# Patient Record
Sex: Female | Born: 1990 | Race: Black or African American | Hispanic: No | Smoking: Never smoker
Health system: Southern US, Community
[De-identification: ages and names within clinical notes are randomized; demographics above are authoritative.]

## PROBLEM LIST (undated history)

## (undated) ENCOUNTER — Inpatient Hospital Stay (HOSPITAL_COMMUNITY): Payer: Self-pay

## (undated) DIAGNOSIS — A749 Chlamydial infection, unspecified: Secondary | ICD-10-CM

## (undated) DIAGNOSIS — D573 Sickle-cell trait: Secondary | ICD-10-CM

## (undated) DIAGNOSIS — D649 Anemia, unspecified: Secondary | ICD-10-CM

## (undated) DIAGNOSIS — I10 Essential (primary) hypertension: Secondary | ICD-10-CM

---
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
## 2010-01-08 ENCOUNTER — Inpatient Hospital Stay (HOSPITAL_COMMUNITY): Admission: AD | Admit: 2010-01-08 | Discharge: 2010-01-08 | Payer: Self-pay | Admitting: Obstetrics & Gynecology

## 2010-01-08 ENCOUNTER — Ambulatory Visit: Payer: Self-pay | Admitting: Nurse Practitioner

## 2010-03-09 ENCOUNTER — Inpatient Hospital Stay (HOSPITAL_COMMUNITY)
Admission: AD | Admit: 2010-03-09 | Discharge: 2010-03-09 | Payer: Self-pay | Source: Home / Self Care | Admitting: Obstetrics & Gynecology

## 2010-04-24 ENCOUNTER — Inpatient Hospital Stay (HOSPITAL_COMMUNITY)
Admission: AD | Admit: 2010-04-24 | Discharge: 2010-04-24 | Payer: Self-pay | Source: Home / Self Care | Attending: Obstetrics and Gynecology | Admitting: Obstetrics and Gynecology

## 2010-05-11 ENCOUNTER — Inpatient Hospital Stay (HOSPITAL_COMMUNITY)
Admission: AD | Admit: 2010-05-11 | Discharge: 2010-05-11 | Payer: Self-pay | Source: Home / Self Care | Attending: Obstetrics & Gynecology | Admitting: Obstetrics & Gynecology

## 2010-05-13 ENCOUNTER — Ambulatory Visit: Admit: 2010-05-13 | Payer: Self-pay | Admitting: Obstetrics & Gynecology

## 2010-05-18 LAB — CBC
HCT: 30.9 % — ABNORMAL LOW (ref 36.0–46.0)
Hemoglobin: 10.4 g/dL — ABNORMAL LOW (ref 12.0–15.0)
MCH: 26.7 pg (ref 26.0–34.0)
MCHC: 33.7 g/dL (ref 30.0–36.0)
MCV: 79.4 fL (ref 78.0–100.0)
Platelets: 205 10*3/uL (ref 150–400)
RBC: 3.89 MIL/uL (ref 3.87–5.11)
RDW: 14.1 % (ref 11.5–15.5)
WBC: 12.2 10*3/uL — ABNORMAL HIGH (ref 4.0–10.5)

## 2010-05-18 LAB — PROTEIN / CREATININE RATIO, URINE
Creatinine, Urine: 129.9 mg/dL
Protein Creatinine Ratio: 0.14 (ref 0.00–0.15)
Total Protein, Urine: 18 mg/dL

## 2010-05-18 LAB — COMPREHENSIVE METABOLIC PANEL
ALT: 17 U/L (ref 0–35)
AST: 22 U/L (ref 0–37)
Albumin: 3.2 g/dL — ABNORMAL LOW (ref 3.5–5.2)
Alkaline Phosphatase: 64 U/L (ref 39–117)
BUN: 2 mg/dL — ABNORMAL LOW (ref 6–23)
CO2: 25 mEq/L (ref 19–32)
Calcium: 8.6 mg/dL (ref 8.4–10.5)
Chloride: 101 mEq/L (ref 96–112)
Creatinine, Ser: 0.58 mg/dL (ref 0.4–1.2)
GFR calc Af Amer: >60 mL/min (ref >60)
GFR calc non Af Amer: >60 mL/min (ref >60)
Glucose, Bld: 89 mg/dL (ref 70–99)
Potassium: 2.8 mEq/L — ABNORMAL LOW (ref 3.5–5.1)
Sodium: 133 mEq/L — ABNORMAL LOW (ref 135–145)
Total Bilirubin: <0.1 mg/dL — ABNORMAL LOW (ref 0.3–1.2)
Total Protein: 7.1 g/dL (ref 6.0–8.3)

## 2010-05-18 LAB — POTASSIUM: Potassium: 2.8 mEq/L — ABNORMAL LOW (ref 3.5–5.1)

## 2010-05-18 LAB — MAGNESIUM: Magnesium: 1.9 mg/dL (ref 1.5–2.5)

## 2010-05-18 LAB — SODIUM, URINE, RANDOM: Sodium, Ur: 85 mEq/L

## 2010-05-27 ENCOUNTER — Ambulatory Visit
Admission: RE | Admit: 2010-05-27 | Discharge: 2010-05-27 | Payer: Self-pay | Source: Home / Self Care | Attending: Obstetrics and Gynecology | Admitting: Obstetrics and Gynecology

## 2010-05-27 ENCOUNTER — Encounter: Payer: Self-pay | Admitting: Obstetrics and Gynecology

## 2010-05-27 LAB — CONVERTED CEMR LAB
Antibody Screen: NEGATIVE
BUN: 3 mg/dL — ABNORMAL LOW (ref 6–23)
Basophils Absolute: 0 10*3/uL (ref 0.0–0.1)
Basophils Relative: 0 % (ref 0–1)
CO2: 24 meq/L (ref 19–32)
Calcium: 8.7 mg/dL (ref 8.4–10.5)
Chlamydia, DNA Probe: NEGATIVE
Chloride: 101 meq/L (ref 96–112)
Creatinine, Ser: 0.58 mg/dL (ref 0.40–1.20)
Eosinophils Absolute: 0.2 10*3/uL (ref 0.0–0.7)
Eosinophils Relative: 2 % (ref 0–5)
GC Probe Amp, Genital: NEGATIVE
Glucose, Bld: 81 mg/dL (ref 70–99)
HCT: 34.3 % — ABNORMAL LOW (ref 36.0–46.0)
HIV: NONREACTIVE
Hemoglobin: 11.2 g/dL — ABNORMAL LOW (ref 12.0–15.0)
Hepatitis B Surface Ag: NEGATIVE
Lymphocytes Relative: 15 % (ref 12–46)
Lymphs Abs: 1.6 10*3/uL (ref 0.7–4.0)
MCHC: 32.7 g/dL (ref 30.0–36.0)
MCV: 80.9 fL (ref 78.0–100.0)
Monocytes Absolute: 0.8 10*3/uL (ref 0.1–1.0)
Monocytes Relative: 8 % (ref 3–12)
Neutro Abs: 7.7 10*3/uL (ref 1.7–7.7)
Neutrophils Relative %: 75 % (ref 43–77)
Platelets: 217 10*3/uL (ref 150–400)
Potassium: 3.7 meq/L (ref 3.5–5.3)
RBC: 4.24 M/uL (ref 3.87–5.11)
RDW: 14.4 % (ref 11.5–15.5)
Rh Type: POSITIVE
Rubella: >500 intl units/mL — ABNORMAL HIGH
Sodium: 137 meq/L (ref 135–145)
Uric Acid, Serum: 3.5 mg/dL (ref 2.4–7.0)
WBC: 10.2 10*3/uL (ref 4.0–10.5)

## 2010-05-27 LAB — POCT URINALYSIS DIPSTICK
Bilirubin Urine: NEGATIVE
Hgb urine dipstick: NEGATIVE
Ketones, ur: NEGATIVE mg/dL
Nitrite: NEGATIVE
Protein, ur: NEGATIVE mg/dL
Specific Gravity, Urine: 1.015 (ref 1.005–1.030)
Urine Glucose, Fasting: NEGATIVE mg/dL
Urobilinogen, UA: 0.2 mg/dL (ref 0.0–1.0)
pH: 6 (ref 5.0–8.0)

## 2010-05-28 ENCOUNTER — Ambulatory Visit (HOSPITAL_COMMUNITY)
Admission: RE | Admit: 2010-05-28 | Discharge: 2010-05-28 | Payer: Self-pay | Source: Home / Self Care | Attending: Family Medicine | Admitting: Family Medicine

## 2010-05-28 ENCOUNTER — Encounter: Payer: Self-pay | Admitting: Obstetrics & Gynecology

## 2010-05-28 LAB — CONVERTED CEMR LAB
Collection Interval-CRCL: 24 h
Creatinine 24 HR UR: 887 mg/(24.h)
Creatinine Clearance: 106 mL/min
Creatinine, Urine: 75.5 mg/dL
Protein, 24H Urine: 47 mg/(24.h) — ABNORMAL LOW

## 2010-06-03 ENCOUNTER — Encounter: Payer: Self-pay | Admitting: Obstetrics and Gynecology

## 2010-06-03 ENCOUNTER — Other Ambulatory Visit: Payer: Self-pay

## 2010-06-03 DIAGNOSIS — O0930 Supervision of pregnancy with insufficient antenatal care, unspecified trimester: Secondary | ICD-10-CM

## 2010-06-03 DIAGNOSIS — O169 Unspecified maternal hypertension, unspecified trimester: Secondary | ICD-10-CM

## 2010-06-03 LAB — POCT URINALYSIS DIPSTICK
Bilirubin Urine: NEGATIVE
Hgb urine dipstick: NEGATIVE
Ketones, ur: NEGATIVE mg/dL
Nitrite: NEGATIVE
Protein, ur: NEGATIVE mg/dL
Specific Gravity, Urine: 1.02 (ref 1.005–1.030)
Urine Glucose, Fasting: NEGATIVE mg/dL
Urobilinogen, UA: 0.2 mg/dL (ref 0.0–1.0)
pH: 6 (ref 5.0–8.0)

## 2010-06-11 ENCOUNTER — Other Ambulatory Visit: Payer: Self-pay

## 2010-06-11 ENCOUNTER — Other Ambulatory Visit: Payer: Self-pay | Admitting: Family Medicine

## 2010-06-11 DIAGNOSIS — O139 Gestational [pregnancy-induced] hypertension without significant proteinuria, unspecified trimester: Secondary | ICD-10-CM

## 2010-06-11 DIAGNOSIS — I10 Essential (primary) hypertension: Secondary | ICD-10-CM

## 2010-06-11 LAB — POCT URINALYSIS DIPSTICK
Bilirubin Urine: NEGATIVE
Hgb urine dipstick: NEGATIVE
Ketones, ur: NEGATIVE mg/dL
Nitrite: NEGATIVE
Protein, ur: NEGATIVE mg/dL
Specific Gravity, Urine: 1.015 (ref 1.005–1.030)
Urine Glucose, Fasting: NEGATIVE mg/dL
Urobilinogen, UA: 0.2 mg/dL (ref 0.0–1.0)
pH: 7 (ref 5.0–8.0)

## 2010-06-15 ENCOUNTER — Other Ambulatory Visit: Payer: Self-pay

## 2010-06-15 DIAGNOSIS — O139 Gestational [pregnancy-induced] hypertension without significant proteinuria, unspecified trimester: Secondary | ICD-10-CM

## 2010-06-18 ENCOUNTER — Encounter (HOSPITAL_COMMUNITY): Payer: Self-pay

## 2010-06-18 ENCOUNTER — Ambulatory Visit (HOSPITAL_COMMUNITY)
Admission: RE | Admit: 2010-06-18 | Discharge: 2010-06-18 | Disposition: A | Discharge status: Home / Self Care | Payer: Medicaid Other | Source: Ambulatory Visit | Attending: Family Medicine | Admitting: Family Medicine

## 2010-06-18 ENCOUNTER — Other Ambulatory Visit: Payer: Self-pay

## 2010-06-18 DIAGNOSIS — O10019 Pre-existing essential hypertension complicating pregnancy, unspecified trimester: Secondary | ICD-10-CM | POA: Insufficient documentation

## 2010-06-18 DIAGNOSIS — Z3689 Encounter for other specified antenatal screening: Secondary | ICD-10-CM | POA: Insufficient documentation

## 2010-06-18 DIAGNOSIS — I10 Essential (primary) hypertension: Secondary | ICD-10-CM

## 2010-06-22 ENCOUNTER — Other Ambulatory Visit: Payer: Self-pay

## 2010-06-25 ENCOUNTER — Other Ambulatory Visit: Payer: Medicaid Other

## 2010-06-25 ENCOUNTER — Other Ambulatory Visit: Payer: Self-pay

## 2010-06-25 DIAGNOSIS — O139 Gestational [pregnancy-induced] hypertension without significant proteinuria, unspecified trimester: Secondary | ICD-10-CM

## 2010-06-25 LAB — POCT URINALYSIS DIPSTICK
Bilirubin Urine: NEGATIVE
Hgb urine dipstick: NEGATIVE
Ketones, ur: NEGATIVE mg/dL
Nitrite: NEGATIVE
Protein, ur: NEGATIVE mg/dL
Specific Gravity, Urine: 1.01 (ref 1.005–1.030)
Urine Glucose, Fasting: NEGATIVE mg/dL
Urobilinogen, UA: 0.2 mg/dL (ref 0.0–1.0)
pH: 6.5 (ref 5.0–8.0)

## 2010-06-29 ENCOUNTER — Other Ambulatory Visit: Payer: Medicaid Other

## 2010-06-29 DIAGNOSIS — O139 Gestational [pregnancy-induced] hypertension without significant proteinuria, unspecified trimester: Secondary | ICD-10-CM

## 2010-07-02 ENCOUNTER — Other Ambulatory Visit: Payer: Self-pay

## 2010-07-02 ENCOUNTER — Encounter: Payer: Self-pay | Admitting: Obstetrics and Gynecology

## 2010-07-02 ENCOUNTER — Other Ambulatory Visit: Payer: Medicaid Other

## 2010-07-02 DIAGNOSIS — O139 Gestational [pregnancy-induced] hypertension without significant proteinuria, unspecified trimester: Secondary | ICD-10-CM

## 2010-07-02 LAB — POCT URINALYSIS DIPSTICK
Bilirubin Urine: NEGATIVE
Hgb urine dipstick: NEGATIVE
Ketones, ur: NEGATIVE mg/dL
Nitrite: NEGATIVE
Protein, ur: NEGATIVE mg/dL
Specific Gravity, Urine: 1.015 (ref 1.005–1.030)
Urine Glucose, Fasting: NEGATIVE mg/dL
Urobilinogen, UA: 0.2 mg/dL (ref 0.0–1.0)
pH: 7 (ref 5.0–8.0)

## 2010-07-02 LAB — CONVERTED CEMR LAB
ALT: <8 units/L (ref 0–35)
AST: 18 units/L (ref 0–37)
Albumin: 3.5 g/dL (ref 3.5–5.2)
Alkaline Phosphatase: 148 units/L — ABNORMAL HIGH (ref 39–117)
BUN: 8 mg/dL (ref 6–23)
CO2: 22 meq/L (ref 19–32)
Calcium: 9 mg/dL (ref 8.4–10.5)
Chloride: 104 meq/L (ref 96–112)
Creatinine, Ser: 0.63 mg/dL (ref 0.40–1.20)
Glucose, Bld: 82 mg/dL (ref 70–99)
HCT: 33.4 % — ABNORMAL LOW (ref 36.0–46.0)
Hemoglobin: 10.8 g/dL — ABNORMAL LOW (ref 12.0–15.0)
MCHC: 32.3 g/dL (ref 30.0–36.0)
MCV: 75.6 fL — ABNORMAL LOW (ref 78.0–100.0)
Platelets: 193 10*3/uL (ref 150–400)
Potassium: 3.7 meq/L (ref 3.5–5.3)
RBC: 4.42 M/uL (ref 3.87–5.11)
RDW: 15.2 % (ref 11.5–15.5)
Sodium: 135 meq/L (ref 135–145)
Total Bilirubin: 0.2 mg/dL — ABNORMAL LOW (ref 0.3–1.2)
Total Protein: 7 g/dL (ref 6.0–8.3)
Uric Acid, Serum: 4.2 mg/dL (ref 2.4–7.0)
WBC: 9.5 10*3/uL (ref 4.0–10.5)

## 2010-07-03 ENCOUNTER — Encounter: Payer: Self-pay | Admitting: Obstetrics & Gynecology

## 2010-07-03 DIAGNOSIS — O139 Gestational [pregnancy-induced] hypertension without significant proteinuria, unspecified trimester: Secondary | ICD-10-CM

## 2010-07-03 LAB — CONVERTED CEMR LAB
Collection Interval-CRCL: 24 hr
Creatinine 24 HR UR: 997 mg/24hr (ref 700–1800)
Creatinine Clearance: 110 mL/min (ref 75–115)
Creatinine, Urine: 70 mg/dL
Protein, 24H Urine: 57 mg/24hr (ref 50–100)

## 2010-07-06 ENCOUNTER — Other Ambulatory Visit: Payer: Medicaid Other

## 2010-07-09 ENCOUNTER — Other Ambulatory Visit: Payer: Self-pay

## 2010-07-09 ENCOUNTER — Encounter: Payer: Self-pay | Admitting: Obstetrics & Gynecology

## 2010-07-09 ENCOUNTER — Other Ambulatory Visit: Payer: Self-pay | Admitting: Family Medicine

## 2010-07-09 DIAGNOSIS — I10 Essential (primary) hypertension: Secondary | ICD-10-CM

## 2010-07-09 DIAGNOSIS — O0930 Supervision of pregnancy with insufficient antenatal care, unspecified trimester: Secondary | ICD-10-CM

## 2010-07-09 DIAGNOSIS — O139 Gestational [pregnancy-induced] hypertension without significant proteinuria, unspecified trimester: Secondary | ICD-10-CM

## 2010-07-09 LAB — POCT URINALYSIS DIPSTICK
Bilirubin Urine: NEGATIVE
Glucose, UA: NEGATIVE mg/dL
Hgb urine dipstick: NEGATIVE
Ketones, ur: NEGATIVE mg/dL
Nitrite: NEGATIVE
Protein, ur: NEGATIVE mg/dL
Specific Gravity, Urine: 1.02 (ref 1.005–1.030)
Urobilinogen, UA: 0.2 mg/dL (ref 0.0–1.0)
pH: 6 (ref 5.0–8.0)

## 2010-07-09 LAB — CONVERTED CEMR LAB
Chlamydia, DNA Probe: NEGATIVE
GC Probe Amp, Genital: NEGATIVE

## 2010-07-10 ENCOUNTER — Encounter: Payer: Self-pay | Admitting: Obstetrics & Gynecology

## 2010-07-13 ENCOUNTER — Other Ambulatory Visit: Payer: Medicaid Other

## 2010-07-13 LAB — CBC
HCT: 33.2 % — ABNORMAL LOW (ref 36.0–46.0)
Hemoglobin: 11.3 g/dL — ABNORMAL LOW (ref 12.0–15.0)
MCH: 27.3 pg (ref 26.0–34.0)
MCHC: 34 g/dL (ref 30.0–36.0)
MCV: 80.2 fL (ref 78.0–100.0)
Platelets: 243 10*3/uL (ref 150–400)
RBC: 4.14 MIL/uL (ref 3.87–5.11)
RDW: 14.8 % (ref 11.5–15.5)
WBC: 11.6 10*3/uL — ABNORMAL HIGH (ref 4.0–10.5)

## 2010-07-13 LAB — URINALYSIS, ROUTINE W REFLEX MICROSCOPIC
Bilirubin Urine: NEGATIVE
Glucose, UA: NEGATIVE mg/dL
Ketones, ur: NEGATIVE mg/dL
Nitrite: NEGATIVE
Protein, ur: NEGATIVE mg/dL
Specific Gravity, Urine: 1.015 (ref 1.005–1.030)
Urobilinogen, UA: 0.2 mg/dL (ref 0.0–1.0)
pH: 6 (ref 5.0–8.0)

## 2010-07-13 LAB — URINE CULTURE
Colony Count: 9000
Culture  Setup Time: 201112232324

## 2010-07-13 LAB — COMPREHENSIVE METABOLIC PANEL
ALT: 24 U/L (ref 0–35)
AST: 44 U/L — ABNORMAL HIGH (ref 0–37)
Albumin: 3.3 g/dL — ABNORMAL LOW (ref 3.5–5.2)
Alkaline Phosphatase: 56 U/L (ref 39–117)
BUN: 4 mg/dL — ABNORMAL LOW (ref 6–23)
CO2: 24 mEq/L (ref 19–32)
Calcium: 8.8 mg/dL (ref 8.4–10.5)
Chloride: 103 mEq/L (ref 96–112)
Creatinine, Ser: 0.52 mg/dL (ref 0.4–1.2)
GFR calc Af Amer: >60 mL/min (ref >60)
GFR calc non Af Amer: >60 mL/min (ref >60)
Glucose, Bld: 90 mg/dL (ref 70–99)
Potassium: 4.4 mEq/L (ref 3.5–5.1)
Sodium: 134 mEq/L — ABNORMAL LOW (ref 135–145)
Total Bilirubin: 0.5 mg/dL (ref 0.3–1.2)
Total Protein: 7.4 g/dL (ref 6.0–8.3)

## 2010-07-13 LAB — URINE MICROSCOPIC-ADD ON

## 2010-07-13 LAB — WET PREP, GENITAL
Trich, Wet Prep: NONE SEEN
Yeast Wet Prep HPF POC: NONE SEEN

## 2010-07-14 LAB — URINALYSIS, ROUTINE W REFLEX MICROSCOPIC
Bilirubin Urine: NEGATIVE
Glucose, UA: NEGATIVE mg/dL
Hgb urine dipstick: NEGATIVE
Ketones, ur: NEGATIVE mg/dL
Nitrite: NEGATIVE
Protein, ur: NEGATIVE mg/dL
Specific Gravity, Urine: 1.02 (ref 1.005–1.030)
Urobilinogen, UA: 0.2 mg/dL (ref 0.0–1.0)
pH: 6.5 (ref 5.0–8.0)

## 2010-07-14 LAB — WET PREP, GENITAL: Yeast Wet Prep HPF POC: NONE SEEN

## 2010-07-14 LAB — URINE CULTURE
Colony Count: >=100000
Culture  Setup Time: 201111080101

## 2010-07-14 LAB — URINE MICROSCOPIC-ADD ON

## 2010-07-14 LAB — GC/CHLAMYDIA PROBE AMP, GENITAL: GC Probe Amp, Genital: POSITIVE — AB

## 2010-07-16 ENCOUNTER — Other Ambulatory Visit: Payer: Self-pay

## 2010-07-16 ENCOUNTER — Inpatient Hospital Stay (HOSPITAL_COMMUNITY)
Admission: AD | Admit: 2010-07-16 | Discharge: 2010-07-16 | Disposition: A | Discharge status: Home / Self Care | Payer: Medicaid Other | Source: Ambulatory Visit | Attending: Family Medicine | Admitting: Family Medicine

## 2010-07-16 ENCOUNTER — Other Ambulatory Visit: Payer: Medicaid Other

## 2010-07-16 DIAGNOSIS — O139 Gestational [pregnancy-induced] hypertension without significant proteinuria, unspecified trimester: Secondary | ICD-10-CM

## 2010-07-16 DIAGNOSIS — O0930 Supervision of pregnancy with insufficient antenatal care, unspecified trimester: Secondary | ICD-10-CM

## 2010-07-16 LAB — COMPREHENSIVE METABOLIC PANEL
AST: 20 U/L (ref 0–37)
BUN: 5 mg/dL — ABNORMAL LOW (ref 6–23)
CO2: 24 mEq/L (ref 19–32)
Chloride: 104 mEq/L (ref 96–112)
Creatinine, Ser: 0.64 mg/dL (ref 0.4–1.2)
GFR calc non Af Amer: >60 mL/min (ref >60)
Glucose, Bld: 86 mg/dL (ref 70–99)
Total Bilirubin: 0.3 mg/dL (ref 0.3–1.2)

## 2010-07-16 LAB — CBC
Hemoglobin: 10.5 g/dL — ABNORMAL LOW (ref 12.0–15.0)
MCH: 23.6 pg — ABNORMAL LOW (ref 26.0–34.0)
MCV: 74.4 fL — ABNORMAL LOW (ref 78.0–100.0)
RBC: 4.45 MIL/uL (ref 3.87–5.11)

## 2010-07-16 LAB — PROTEIN / CREATININE RATIO, URINE
Creatinine, Urine: 150.8 mg/dL
Protein Creatinine Ratio: 0.13 (ref 0.00–0.15)
Total Protein, Urine: 20 mg/dL

## 2010-07-16 LAB — POCT URINALYSIS DIPSTICK
Bilirubin Urine: NEGATIVE
Glucose, UA: NEGATIVE mg/dL
Hgb urine dipstick: NEGATIVE
Specific Gravity, Urine: 1.02 (ref 1.005–1.030)
pH: 6 (ref 5.0–8.0)

## 2010-07-20 ENCOUNTER — Ambulatory Visit (HOSPITAL_COMMUNITY)
Admission: RE | Admit: 2010-07-20 | Discharge: 2010-07-20 | Disposition: A | Discharge status: Home / Self Care | Payer: Medicaid Other | Source: Ambulatory Visit | Attending: Family Medicine | Admitting: Family Medicine

## 2010-07-20 DIAGNOSIS — I10 Essential (primary) hypertension: Secondary | ICD-10-CM

## 2010-07-20 DIAGNOSIS — O10019 Pre-existing essential hypertension complicating pregnancy, unspecified trimester: Secondary | ICD-10-CM | POA: Insufficient documentation

## 2010-07-20 DIAGNOSIS — Z3689 Encounter for other specified antenatal screening: Secondary | ICD-10-CM | POA: Insufficient documentation

## 2010-07-23 ENCOUNTER — Other Ambulatory Visit: Payer: Self-pay | Admitting: Obstetrics & Gynecology

## 2010-07-23 DIAGNOSIS — O139 Gestational [pregnancy-induced] hypertension without significant proteinuria, unspecified trimester: Secondary | ICD-10-CM

## 2010-07-23 DIAGNOSIS — O0930 Supervision of pregnancy with insufficient antenatal care, unspecified trimester: Secondary | ICD-10-CM

## 2010-07-23 LAB — POCT URINALYSIS DIP (DEVICE)
Bilirubin Urine: NEGATIVE
Hgb urine dipstick: NEGATIVE
Nitrite: NEGATIVE
pH: 6.5 (ref 5.0–8.0)

## 2010-07-26 ENCOUNTER — Inpatient Hospital Stay (HOSPITAL_COMMUNITY)
Admission: AD | Admit: 2010-07-26 | Discharge: 2010-07-28 | DRG: 775 | Disposition: A | Discharge status: Home / Self Care | Payer: Medicaid Other | Source: Ambulatory Visit | Attending: Obstetrics & Gynecology | Admitting: Obstetrics & Gynecology

## 2010-07-26 LAB — COMPREHENSIVE METABOLIC PANEL
ALT: 11 U/L (ref 0–35)
ALT: 10 U/L (ref 0–35)
AST: 19 U/L (ref 0–37)
Alkaline Phosphatase: 176 U/L — ABNORMAL HIGH (ref 39–117)
BUN: 9 mg/dL (ref 6–23)
CO2: 25 mEq/L (ref 19–32)
CO2: 25 mEq/L (ref 19–32)
Calcium: 9.3 mg/dL (ref 8.4–10.5)
Chloride: 102 mEq/L (ref 96–112)
Creatinine, Ser: 0.74 mg/dL (ref 0.4–1.2)
GFR calc Af Amer: >60 mL/min (ref >60)
GFR calc non Af Amer: >60 mL/min (ref >60)
GFR calc non Af Amer: >60 mL/min (ref >60)
Glucose, Bld: 92 mg/dL (ref 70–99)
Potassium: 4 mEq/L (ref 3.5–5.1)
Sodium: 132 mEq/L — ABNORMAL LOW (ref 135–145)
Sodium: 137 mEq/L (ref 135–145)
Total Bilirubin: 0.3 mg/dL (ref 0.3–1.2)
Total Protein: 8.5 g/dL — ABNORMAL HIGH (ref 6.0–8.3)

## 2010-07-26 LAB — CBC
HCT: 29.4 % — ABNORMAL LOW (ref 36.0–46.0)
Hemoglobin: 11 g/dL — ABNORMAL LOW (ref 12.0–15.0)
Hemoglobin: 11.5 g/dL — ABNORMAL LOW (ref 12.0–15.0)
Hemoglobin: 9.2 g/dL — ABNORMAL LOW (ref 12.0–15.0)
MCH: 23.4 pg — ABNORMAL LOW (ref 26.0–34.0)
MCHC: 31.3 g/dL (ref 30.0–36.0)
MCHC: 32.1 g/dL (ref 30.0–36.0)
MCHC: 32.2 g/dL (ref 30.0–36.0)
MCV: 74.9 fL — ABNORMAL LOW (ref 78.0–100.0)
RBC: 4.71 MIL/uL (ref 3.87–5.11)
RDW: 16.4 % — ABNORMAL HIGH (ref 11.5–15.5)
RDW: 16.5 % — ABNORMAL HIGH (ref 11.5–15.5)
RDW: 17.4 % — ABNORMAL HIGH (ref 11.5–15.5)
WBC: 15.3 10*3/uL — ABNORMAL HIGH (ref 4.0–10.5)

## 2010-07-26 LAB — URINE MICROSCOPIC-ADD ON

## 2010-07-26 LAB — LIPASE, BLOOD: Lipase: 34 U/L (ref 11–59)

## 2010-07-26 LAB — URINALYSIS, ROUTINE W REFLEX MICROSCOPIC
Bilirubin Urine: NEGATIVE
Bilirubin Urine: NEGATIVE
Hgb urine dipstick: NEGATIVE
Ketones, ur: 15 mg/dL — AB
Nitrite: NEGATIVE
Protein, ur: NEGATIVE mg/dL
Specific Gravity, Urine: 1.015 (ref 1.005–1.030)
Urobilinogen, UA: 0.2 mg/dL (ref 0.0–1.0)

## 2010-07-26 LAB — GC/CHLAMYDIA PROBE AMP, GENITAL
Chlamydia, DNA Probe: NEGATIVE
GC Probe Amp, Genital: NEGATIVE

## 2010-07-26 LAB — DIFFERENTIAL
Basophils Relative: 0 % (ref 0–1)
Eosinophils Relative: 3 % (ref 0–5)
Lymphocytes Relative: 19 % (ref 12–46)
Monocytes Relative: 9 % (ref 3–12)
Neutrophils Relative %: 69 % (ref 43–77)

## 2010-07-26 LAB — URINE CULTURE

## 2010-07-26 LAB — WET PREP, GENITAL: Clue Cells Wet Prep HPF POC: NONE SEEN

## 2010-07-26 LAB — RPR
RPR Ser Ql: NONREACTIVE
RPR Ser Ql: NONREACTIVE

## 2010-07-27 ENCOUNTER — Other Ambulatory Visit: Payer: Medicaid Other

## 2010-08-05 ENCOUNTER — Inpatient Hospital Stay (HOSPITAL_COMMUNITY): Admission: AD | Admit: 2010-08-05 | Payer: Self-pay | Source: Home / Self Care | Admitting: Obstetrics & Gynecology

## 2010-08-06 LAB — URINALYSIS, ROUTINE W REFLEX MICROSCOPIC
Glucose, UA: NEGATIVE mg/dL
Hgb urine dipstick: NEGATIVE
Protein, ur: NEGATIVE mg/dL
Specific Gravity, Urine: 1.024 (ref 1.005–1.030)
Urobilinogen, UA: 0.2 mg/dL (ref 0.0–1.0)

## 2010-08-06 LAB — WET PREP, GENITAL: Trich, Wet Prep: NONE SEEN

## 2010-08-06 LAB — HCG, QUANTITATIVE, PREGNANCY: hCG, Beta Chain, Quant, S: <2 m[IU]/mL (ref <5)

## 2010-08-06 LAB — GC/CHLAMYDIA PROBE AMP, GENITAL: Chlamydia, DNA Probe: POSITIVE — AB

## 2010-08-06 LAB — URINE MICROSCOPIC-ADD ON

## 2010-08-06 LAB — RPR: RPR Ser Ql: NONREACTIVE

## 2010-08-26 ENCOUNTER — Ambulatory Visit: Payer: Medicaid Other | Admitting: Obstetrics and Gynecology

## 2010-09-02 ENCOUNTER — Ambulatory Visit: Payer: Medicaid Other | Admitting: Family Medicine

## 2010-11-23 ENCOUNTER — Encounter: Payer: Self-pay | Admitting: Obstetrics and Gynecology

## 2011-01-21 LAB — POCT I-STAT CREATININE
Creatinine, Ser: 0.9
Operator id: 235561

## 2011-01-21 LAB — I-STAT 8, (EC8 V) (CONVERTED LAB)
Acid-base deficit: 2
BUN: 12
Bicarbonate: 25.3 — ABNORMAL HIGH
Chloride: 105
Glucose, Bld: 98
HCT: 44
Hemoglobin: 15
Operator id: 235561
Potassium: 3.8
Sodium: 138
TCO2: 27
pCO2, Ven: 53 — ABNORMAL HIGH
pH, Ven: 7.287

## 2011-02-03 LAB — POCT URINALYSIS DIP (DEVICE)
Bilirubin Urine: NEGATIVE
Glucose, UA: NEGATIVE
Ketones, ur: NEGATIVE
Operator id: 29721
Protein, ur: NEGATIVE
Specific Gravity, Urine: 1.015

## 2011-02-03 LAB — POCT PREGNANCY, URINE: Preg Test, Ur: NEGATIVE

## 2012-04-03 ENCOUNTER — Inpatient Hospital Stay (HOSPITAL_COMMUNITY)
Admission: AD | Admit: 2012-04-03 | Discharge: 2012-04-04 | Disposition: A | Discharge status: Home / Self Care | Payer: Medicaid Other | Source: Ambulatory Visit | Attending: Obstetrics & Gynecology | Admitting: Obstetrics & Gynecology

## 2012-04-03 ENCOUNTER — Encounter (HOSPITAL_COMMUNITY): Payer: Self-pay | Admitting: *Deleted

## 2012-04-03 ENCOUNTER — Inpatient Hospital Stay (HOSPITAL_COMMUNITY): Payer: Medicaid Other

## 2012-04-03 DIAGNOSIS — O23599 Infection of other part of genital tract in pregnancy, unspecified trimester: Secondary | ICD-10-CM

## 2012-04-03 DIAGNOSIS — A5901 Trichomonal vulvovaginitis: Secondary | ICD-10-CM | POA: Insufficient documentation

## 2012-04-03 DIAGNOSIS — O98819 Other maternal infectious and parasitic diseases complicating pregnancy, unspecified trimester: Secondary | ICD-10-CM | POA: Insufficient documentation

## 2012-04-03 DIAGNOSIS — O209 Hemorrhage in early pregnancy, unspecified: Secondary | ICD-10-CM

## 2012-04-03 HISTORY — DX: Essential (primary) hypertension: I10

## 2012-04-03 LAB — URINALYSIS, ROUTINE W REFLEX MICROSCOPIC
Ketones, ur: NEGATIVE mg/dL
Nitrite: POSITIVE — AB
Specific Gravity, Urine: >1.03 — ABNORMAL HIGH (ref 1.005–1.030)
pH: 6 (ref 5.0–8.0)

## 2012-04-03 LAB — CBC WITH DIFFERENTIAL/PLATELET
Basophils Absolute: 0 10*3/uL (ref 0.0–0.1)
Eosinophils Absolute: 0.2 10*3/uL (ref 0.0–0.7)
Eosinophils Relative: 2 % (ref 0–5)
MCH: 23.9 pg — ABNORMAL LOW (ref 26.0–34.0)
MCV: 72.1 fL — ABNORMAL LOW (ref 78.0–100.0)
Platelets: 241 10*3/uL (ref 150–400)
RDW: 16.7 % — ABNORMAL HIGH (ref 11.5–15.5)
WBC: 9.1 10*3/uL (ref 4.0–10.5)

## 2012-04-03 LAB — POCT PREGNANCY, URINE: Preg Test, Ur: POSITIVE — AB

## 2012-04-03 LAB — WET PREP, GENITAL: Yeast Wet Prep HPF POC: NONE SEEN

## 2012-04-03 LAB — URINE MICROSCOPIC-ADD ON

## 2012-04-03 MED ORDER — METRONIDAZOLE 500 MG PO TABS: 500.0000 mg | ORAL_TABLET | Freq: Two times a day (BID) | ORAL | Status: DC

## 2012-04-03 NOTE — MAU Note (Signed)
Pt staates she had 1 episode of a small amt of bleeding-and no more-no bleeding now-denies cramping

## 2012-04-03 NOTE — MAU Provider Note (Signed)
History     CSN: [id number]  Arrival date and time: 04/03/12 2007   First Provider Initiated Contact with Patient 04/03/12 2138      Chief Complaint  Patient presents with  . Vaginal Bleeding   HPI Wladyslawa Disbro Noh is 21 y.o. G2P1000 [redacted]w[redacted]d weeks presenting with vaginal bleeding.  The bleeding began 1 hours ago--"gush", no clots.  Denies abdominal or pelvic pain.  Is waiting on Medicaid for prenatal care.  [facility].      Past Medical History  Diagnosis Date  . Hypertension     History reviewed. No pertinent past surgical history.  History reviewed. No pertinent family history.  History  Substance Use Topics  . Smoking status: Never Smoker   . Smokeless tobacco: Not on file  . Alcohol Use: No    Allergies: No Known Allergies  Prescriptions prior to admission  Medication Sig Dispense Refill  . acetaminophen (TYLENOL) 325 MG tablet Take 650 mg by mouth every 6 (six) hours as needed. pain        Review of Systems  Constitutional: Negative.   Respiratory: Negative.   Cardiovascular: Negative.   Gastrointestinal: Positive for nausea and constipation. Negative for abdominal pain.  Genitourinary:       + for vaginal bleeding.   Physical Exam   Blood pressure 146/89, pulse 91, temperature 98.1 F (36.7 C), resp. rate 18, height 5\' 2"  (1.575 m), weight 131 lb 6 oz (59.591 kg), last menstrual period 01/07/2012, SpO2 100.00%, unknown if currently breastfeeding.  Physical Exam  Constitutional: She is oriented to person, place, and time. She appears well-developed and well-nourished. No distress.  HENT:  Head: Normocephalic.  Neck: Normal range of motion.  Cardiovascular: Normal rate.   Respiratory: Effort normal.  GI: Soft. She exhibits no distension and no mass. There is no tenderness. There is no rebound and no guarding.  Genitourinary: There is no tenderness or lesion on the right labia. There is no tenderness or lesion on  the left labia. Uterus is enlarged (size >dates). Uterus is not tender. Cervix exhibits no motion tenderness, no discharge and no friability. There is bleeding (small amount of bright red blood with 1 clot.  ) around the vagina.  Neurological: She is alert and oriented to person, place, and time.  Skin: Skin is warm and dry.  Psychiatric: She has a normal mood and affect. Her behavior is normal.   + FHT at 160bbp by doppler    BLOOD TYPE from previous record is B Positive Results for orders placed during the hospital encounter of 04/03/12 (from the past 24 hour(s))  URINALYSIS, ROUTINE W REFLEX MICROSCOPIC     Status: Abnormal   Collection Time   04/03/12  8:45 PM      Component Value Range   Color, Urine YELLOW  YELLOW   APPearance CLOUDY (*) CLEAR   Specific Gravity, Urine >1.030 (*) 1.005 - 1.030   pH 6.0  5.0 - 8.0   Glucose, UA NEGATIVE  NEGATIVE mg/dL   Hgb urine dipstick LARGE (*) NEGATIVE   Bilirubin Urine NEGATIVE  NEGATIVE   Ketones, ur NEGATIVE  NEGATIVE mg/dL   Protein, ur NEGATIVE  NEGATIVE mg/dL   Urobilinogen, UA 0.2  0.0 - 1.0 mg/dL   Nitrite POSITIVE (*) NEGATIVE   Leukocytes, UA SMALL (*) NEGATIVE  URINE MICROSCOPIC-ADD ON     Status: Abnormal   Collection Time   04/03/12  8:45 PM  Component Value Range   Squamous Epithelial / LPF FEW (*) RARE   WBC, UA 11-20  <3 WBC/hpf   RBC / HPF TOO NUMEROUS TO COUNT  <3 RBC/hpf   Bacteria, UA FEW (*) RARE   Urine-Other TRICHOMONAS PRESENT    POCT PREGNANCY, URINE     Status: Abnormal   Collection Time   04/03/12  8:53 PM      Component Value Range   Preg Test, Ur POSITIVE (*) NEGATIVE  CBC WITH DIFFERENTIAL     Status: Abnormal   Collection Time   04/03/12  9:35 PM      Component Value Range   WBC 9.1  4.0 - 10.5 K/uL   RBC 4.44  3.87 - 5.11 MIL/uL   Hemoglobin 10.6 (*) 12.0 - 15.0 g/dL   HCT 16.1 (*) 09.6 - 04.5 %   MCV 72.1 (*) 78.0 - 100.0 fL   MCH 23.9 (*) 26.0 - 34.0 pg   MCHC 33.1  30.0 - 36.0 g/dL   RDW  40.9 (*) 81.1 - 15.5 %   Platelets 241  150 - 400 K/uL   Neutrophils Relative 68  43 - 77 %   Neutro Abs 6.1  1.7 - 7.7 K/uL   Lymphocytes Relative 21  12 - 46 %   Lymphs Abs 1.9  0.7 - 4.0 K/uL   Monocytes Relative 10  3 - 12 %   Monocytes Absolute 0.9  0.1 - 1.0 K/uL   Eosinophils Relative 2  0 - 5 %   Eosinophils Absolute 0.2  0.0 - 0.7 K/uL   Basophils Relative 0  0 - 1 %   Basophils Absolute 0.0  0.0 - 0.1 K/uL  WET PREP, GENITAL     Status: Abnormal   Collection Time   04/03/12  9:45 PM      Component Value Range   Yeast Wet Prep HPF POC NONE SEEN  NONE SEEN   Trich, Wet Prep NONE SEEN  NONE SEEN   Clue Cells Wet Prep HPF POC NONE SEEN  NONE SEEN   WBC, Wet Prep HPF POC FEW (*) NONE SEEN   MAU Course  Procedures  MDM At time of discharge, patient states that after her baby was born over 1 year ago, the doctors told her to stay on her blood pressure medicine.  She was on Labetalol.   Her blood pressure tonight was 146/89 on admission and recheck at time of discharge was 115/69.  Reassure that her blood pressure was normal at this time.  No medication needed.    Assessment and Plan  A:  Vaginal bleeding at [redacted]w[redacted]d gestation    Trichomonal vaginitis  P:  Rx for Flagyl 500mg  po bid X 1 week     Encourage to take prenatal vitamins daily     Begin prenatal care with doctor of your choice  Matt Holmes 04/03/2012, 9:39 PM

## 2012-04-03 NOTE — MAU Note (Signed)
Patient is in with new onset of vaginal bleeding. New pad given. She c/o left mid back pain, no cramping. lmp 01/07/2012

## 2012-04-05 LAB — URINE CULTURE: Colony Count: >=100000

## 2012-04-07 ENCOUNTER — Other Ambulatory Visit: Payer: Self-pay | Admitting: Advanced Practice Midwife

## 2012-04-07 MED ORDER — NITROFURANTOIN MONOHYD MACRO 100 MG PO CAPS: 100.0000 mg | ORAL_CAPSULE | Freq: Two times a day (BID) | ORAL | Status: DC

## 2012-04-07 NOTE — Progress Notes (Signed)
Positive urine culture for E.Coli.  Left message at [phone] for pt to call MAU.  Macrobid 100 mg BID x7 days sent to pt pharmacy.

## 2012-05-03 NOTE — L&D Delivery Note (Signed)
Delivery Note Pt pushed well and at 11:30 PM a viable female was delivered via Vaginal, Spontaneous Delivery (Presentation: OA).  APGAR: 8, 9; weight: pending.  Infant dried and lifted to pt's abd. Cord clamped and cut by FOB. Hospital cord blood sample collected. Placenta status: Intact, Spontaneous.  Cord: 3 vessels  Anesthesia: Epidural  Episiotomy: None Lacerations: None Est. Blood Loss (mL): 250  Mom to postpartum.  Baby to nursery-stable.  Cam Hai 09/20/2012, 12:25 AM

## 2012-05-03 NOTE — L&D Delivery Note (Signed)
Attestation of Attending Supervision of Advanced Practitioner (CNM/NP): Evaluation and management procedures were performed by the Advanced Practitioner under my supervision and collaboration.  I have reviewed the Advanced Practitioner's note and chart, and I agree with the management and plan.  Adryana Mogensen 09/26/2012 10:44 AM   

## 2012-06-29 ENCOUNTER — Inpatient Hospital Stay (HOSPITAL_COMMUNITY)
Admission: AD | Admit: 2012-06-29 | Discharge: 2012-07-01 | DRG: 781 | Disposition: A | Discharge status: Home / Self Care | Payer: Medicaid Other | Source: Ambulatory Visit | Attending: Obstetrics & Gynecology | Admitting: Obstetrics & Gynecology

## 2012-06-29 ENCOUNTER — Inpatient Hospital Stay (HOSPITAL_COMMUNITY): Payer: Medicaid Other

## 2012-06-29 ENCOUNTER — Encounter (HOSPITAL_COMMUNITY): Payer: Self-pay | Admitting: *Deleted

## 2012-06-29 DIAGNOSIS — O0930 Supervision of pregnancy with insufficient antenatal care, unspecified trimester: Secondary | ICD-10-CM

## 2012-06-29 DIAGNOSIS — O26879 Cervical shortening, unspecified trimester: Secondary | ICD-10-CM

## 2012-06-29 DIAGNOSIS — O4700 False labor before 37 completed weeks of gestation, unspecified trimester: Secondary | ICD-10-CM

## 2012-06-29 DIAGNOSIS — N390 Urinary tract infection, site not specified: Secondary | ICD-10-CM

## 2012-06-29 DIAGNOSIS — O10019 Pre-existing essential hypertension complicating pregnancy, unspecified trimester: Secondary | ICD-10-CM

## 2012-06-29 DIAGNOSIS — O26872 Cervical shortening, second trimester: Secondary | ICD-10-CM

## 2012-06-29 DIAGNOSIS — O2390 Unspecified genitourinary tract infection in pregnancy, unspecified trimester: Secondary | ICD-10-CM | POA: Diagnosis present

## 2012-06-29 LAB — URINALYSIS, ROUTINE W REFLEX MICROSCOPIC
Glucose, UA: NEGATIVE mg/dL
Ketones, ur: NEGATIVE mg/dL
Protein, ur: NEGATIVE mg/dL

## 2012-06-29 LAB — OB RESULTS CONSOLE GC/CHLAMYDIA
Chlamydia: POSITIVE
Gonorrhea: NEGATIVE

## 2012-06-29 LAB — COMPREHENSIVE METABOLIC PANEL
ALT: 7 U/L (ref 0–35)
Albumin: 3.1 g/dL — ABNORMAL LOW (ref 3.5–5.2)
Alkaline Phosphatase: 80 U/L (ref 39–117)
Potassium: 3.6 mEq/L (ref 3.5–5.1)
Sodium: 132 mEq/L — ABNORMAL LOW (ref 135–145)
Total Protein: 7.8 g/dL (ref 6.0–8.3)

## 2012-06-29 LAB — URINE MICROSCOPIC-ADD ON

## 2012-06-29 LAB — OB RESULTS CONSOLE HIV ANTIBODY (ROUTINE TESTING): HIV: NONREACTIVE

## 2012-06-29 LAB — CBC
HCT: 30.7 % — ABNORMAL LOW (ref 36.0–46.0)
MCHC: 31.9 g/dL (ref 30.0–36.0)
MCV: 70.1 fL — ABNORMAL LOW (ref 78.0–100.0)
RDW: 16.1 % — ABNORMAL HIGH (ref 11.5–15.5)

## 2012-06-29 LAB — RAPID HIV SCREEN (WH-MAU): Rapid HIV Screen: NONREACTIVE

## 2012-06-29 MED ORDER — ZOLPIDEM TARTRATE 5 MG PO TABS: 5.0000 mg | ORAL_TABLET | Freq: Every evening | ORAL | Status: DC | PRN

## 2012-06-29 MED ORDER — DOCUSATE SODIUM 100 MG PO CAPS
100.0000 mg | ORAL_CAPSULE | Freq: Every day | ORAL | Status: DC
  Administered 2012-06-30: 100 mg via ORAL
  Filled 2012-06-29: qty 1

## 2012-06-29 MED ORDER — PRENATAL MULTIVITAMIN CH
1.0000 | ORAL_TABLET | Freq: Every day | ORAL | Status: DC
  Administered 2012-06-30: 1 via ORAL
  Filled 2012-06-29: qty 1

## 2012-06-29 MED ORDER — SODIUM CHLORIDE 0.9 % IJ SOLN
3.0000 mL | Freq: Two times a day (BID) | INTRAMUSCULAR | Status: DC
  Administered 2012-06-29 – 2012-06-30 (×3): 3 mL via INTRAVENOUS

## 2012-06-29 MED ORDER — BETAMETHASONE SOD PHOS & ACET 6 (3-3) MG/ML IJ SUSP
12.0000 mg | INTRAMUSCULAR | Status: AC
  Administered 2012-06-29 – 2012-06-30 (×2): 12 mg via INTRAMUSCULAR
  Filled 2012-06-29 (×2): qty 2

## 2012-06-29 MED ORDER — CALCIUM CARBONATE ANTACID 500 MG PO CHEW: 2.0000 | CHEWABLE_TABLET | ORAL | Status: DC | PRN

## 2012-06-29 MED ORDER — LABETALOL HCL 100 MG PO TABS
100.0000 mg | ORAL_TABLET | Freq: Two times a day (BID) | ORAL | Status: DC
  Administered 2012-06-29 – 2012-06-30 (×3): 100 mg via ORAL
  Filled 2012-06-29 (×4): qty 1

## 2012-06-29 MED ORDER — ACETAMINOPHEN 325 MG PO TABS: 650.0000 mg | ORAL_TABLET | ORAL | Status: DC | PRN

## 2012-06-29 MED ORDER — SODIUM CHLORIDE 0.9 % IJ SOLN: 3.0000 mL | INTRAMUSCULAR | Status: DC | PRN

## 2012-06-29 MED ORDER — SODIUM CHLORIDE 0.9 % IV SOLN: 250.0000 mL | INTRAVENOUS | Status: DC | PRN

## 2012-06-29 NOTE — MAU Note (Signed)
Patient states she has been having abdominal pain on the left side for several days off and on when walking. Has been worse today and more constant. Patient has had no prenatal care. Denies bleeding or leaking and reports good fetal movement.

## 2012-06-29 NOTE — MAU Provider Note (Signed)
History     CSN: [id number]  Arrival date and time: 06/29/12 1518   None     Chief Complaint  Patient presents with  . Abdominal Pain   HPI  22 year old G2P1001 @ [redacted]w[redacted]d by 14 week ultrasound who has had not prenatal care and presents with left lower abdominal pain. The pain started two days ago and radiates to her back and left inguinal area. It is exacerbated by walking. It is alleviated by nothing. It is not accompanied by nausea, vomiting, diarrhea, vaginal bleeding or liquid discharge, or the sensation of contractions. She denies any recent trama or heavy lifting. She has been compliant with her home labetalol and pre-natal vitamins. She denies any alcohol, drug or tobacco use.    OB History   Grav Para Term Preterm Abortions TAB SAB Ect Mult Living   2 1 1            Previous delivery was NSVD @ term in March 2012   Past Medical History  Diagnosis Date  . Hypertension     No past surgical history on file.  No family history on file.  History  Substance Use Topics  . Smoking status: Never Smoker   . Smokeless tobacco: Not on file  . Alcohol Use: No    Allergies: No Known Allergies  Prescriptions prior to admission  Medication Sig Dispense Refill  . labetalol (NORMODYNE) 200 MG tablet Take 200 mg by mouth daily.      . Prenatal Vit-Fe Fumarate-FA (PRENATAL MULTIVITAMIN) TABS Take 1 tablet by mouth daily at 12 noon.        Review of Systems  Constitutional: Negative for fever, chills and weight loss.  Eyes: Negative for blurred vision.  Respiratory: Negative for shortness of breath.   Gastrointestinal: Negative for abdominal pain.  Genitourinary: Negative for dysuria.  Musculoskeletal: Positive for back pain.  Neurological: Negative for headaches.   Physical Exam   Blood pressure 134/90, pulse 105, temperature 98.2 F (36.8 C), temperature source Oral, resp. rate 16, height 5\' 3"  (1.6 m), weight 67.767 kg (149 lb 6.4 oz), last menstrual period 01/07/2012,  SpO2 100.00%.  Physical Exam  Constitutional: She is oriented to person, place, and time. She appears well-developed and well-nourished. No distress.  HENT:  Head: Normocephalic and atraumatic.  Mouth/Throat: Oropharynx is clear and moist. No oropharyngeal exudate.  Eyes: Pupils are equal, round, and reactive to light.  Neck: Normal range of motion.  Cardiovascular: Normal rate, regular rhythm and normal heart sounds.   Respiratory: Effort normal and breath sounds normal.  GI: There is no tenderness.  Gravid; Uterus measures 24cm  Genitourinary: Vagina normal. There is no lesion on the right labia. There is no lesion on the left labia. Cervix exhibits discharge. No bleeding around the vagina.  Mucousy white discharge  Musculoskeletal: She exhibits no edema.  Neurological: She is alert and oriented to person, place, and time.  Skin: Skin is warm and dry. She is not diaphoretic.  Psychiatric: She has a normal mood and affect. Her behavior is normal. Judgment and thought content normal.   Dilation: 1 Effacement (%): Thick Exam by:: Dr. Penne Lash, Lynnell Chad   MAU Course  Procedures  MDM   Assessment and Plan  22 year old G2P1001 @ [redacted]w[redacted]d with no prenatal care and chronic hypertension who presents with abdominal pain and mild dilation of the cervix - Admit for evaluation of preterm labor: obtain fetal fibronectin, place on monitors, give betamethasone IM x 2  -  Chronic HTN: continue labetalol 200 mg daily, check CBC, CMET, 24 hours urine protein  - Chlamydia Infection: treated in December, obtain test of cure - PPX: SCD's    Mat Carne 06/29/2012, 4:21 PM

## 2012-06-30 LAB — RPR: RPR Ser Ql: NONREACTIVE

## 2012-06-30 LAB — TYPE AND SCREEN

## 2012-06-30 MED ORDER — PROGESTERONE MICRONIZED 200 MG PO CAPS
200.0000 mg | ORAL_CAPSULE | Freq: Every day | ORAL | Status: DC
  Filled 2012-06-30: qty 1

## 2012-06-30 MED ORDER — AZITHROMYCIN 250 MG PO TABS
1000.0000 mg | ORAL_TABLET | Freq: Once | ORAL | Status: AC
  Administered 2012-06-30: 1000 mg via ORAL
  Filled 2012-06-30: qty 4

## 2012-06-30 MED ORDER — NITROFURANTOIN MONOHYD MACRO 100 MG PO CAPS
100.0000 mg | ORAL_CAPSULE | Freq: Two times a day (BID) | ORAL | Status: DC
  Administered 2012-06-30 (×2): 100 mg via ORAL
  Filled 2012-06-30 (×3): qty 1

## 2012-06-30 MED ORDER — CEPHALEXIN 500 MG PO CAPS: 500.0000 mg | ORAL_CAPSULE | Freq: Two times a day (BID) | ORAL | Status: DC

## 2012-06-30 MED ORDER — PROGESTERONE MICRONIZED 200 MG PO CAPS
200.0000 mg | ORAL_CAPSULE | Freq: Every day | ORAL | Status: DC
  Administered 2012-06-30 (×2): 200 mg via VAGINAL
  Filled 2012-06-30 (×2): qty 1

## 2012-06-30 NOTE — Progress Notes (Signed)
Patient ID: Becky Torres, female   DOB: 07/14/1990, 22 y.o.   MRN: [id number] Patient is doing well without any complaints. Patient denies any cramping/pelvic pressure. She reports good fetal movement  Vital signs stable. BP within normal range Toco: no contractions  A/P 22 yo G2P1001 with CHTN admitted with PTL at 27 weeks -Patient hemodynamically stable  - Informed patient that we are awaiting 24 hour urine protein prior to discharge - Also informed patient of positive chlamydia test- will treat with 1 gm azithromycin. Patient advised to inform partner and to abstain from intercourse for 2 weeks after partner gets treated. Patient verbalized understanding - D/C planning in am

## 2012-06-30 NOTE — Progress Notes (Signed)
Patient ID: Becky Torres, female   DOB: 02/13/1991, 23 y.o.   MRN: [id number] FACULTY PRACTICE ANTEPARTUM(COMPREHENSIVE) NOTE  Becky Torres is a 22 y.o. G2P1001 at [redacted]w[redacted]d  who is admitted for Preterm labor.   Length of Stay:  1  Days  Subjective: No complaints Patient reports the fetal movement as active. Patient reports uterine contraction  activity as none. Patient reports  vaginal bleeding as none. Patient describes fluid per vagina as None.  Vitals:  Blood pressure 111/48, pulse 113, temperature 97.9 F (36.6 C), temperature source Oral, resp. rate 18, height 5\' 3"  (1.6 m), weight 149 lb 6.4 oz (67.767 kg), last menstrual period 01/07/2012, SpO2 100.00%. Physical Examination:  General appearance - alert, well appearing, and in no distress Mental status - alert, oriented to person, place, and time Abdomen - Abdomen:  Gravid, soft, non tender, non distended Ext - no edema or leg swelling, homans' negative bilaterally Backl - No CVA tenderness  Fetal Monitoring: irritability on toco.  Labs:  Results for orders placed during the hospital encounter of 06/29/12 (from the past 24 hour(s))  URINALYSIS, ROUTINE W REFLEX MICROSCOPIC   Collection Time    06/29/12  3:43 PM      Result Value Range   Color, Urine YELLOW  YELLOW   APPearance CLEAR  CLEAR   Specific Gravity, Urine 1.015  1.005 - 1.030   pH 6.0  5.0 - 8.0   Glucose, UA NEGATIVE  NEGATIVE mg/dL   Hgb urine dipstick NEGATIVE  NEGATIVE   Bilirubin Urine NEGATIVE  NEGATIVE   Ketones, ur NEGATIVE  NEGATIVE mg/dL   Protein, ur NEGATIVE  NEGATIVE mg/dL   Urobilinogen, UA 0.2  0.0 - 1.0 mg/dL   Nitrite POSITIVE (*) NEGATIVE   Leukocytes, UA SMALL (*) NEGATIVE  URINE MICROSCOPIC-ADD ON   Collection Time    06/29/12  3:43 PM      Result Value Range   Squamous Epithelial / LPF FEW (*) RARE   WBC, UA 3-6  <3 WBC/hpf   RBC / HPF 0-2  <3 RBC/hpf   Bacteria, UA FEW (*) RARE  FETAL FIBRONECTIN   Collection Time    06/29/12   5:00 PM      Result Value Range   Fetal Fibronectin NEGATIVE  NEGATIVE  CBC   Collection Time    06/29/12  5:25 PM      Result Value Range   WBC 11.8 (*) 4.0 - 10.5 K/uL   RBC 4.38  3.87 - 5.11 MIL/uL   Hemoglobin 9.8 (*) 12.0 - 15.0 g/dL   HCT 72.5 (*) 36.6 - 44.0 %   MCV 70.1 (*) 78.0 - 100.0 fL   MCH 22.4 (*) 26.0 - 34.0 pg   MCHC 31.9  30.0 - 36.0 g/dL   RDW 34.7 (*) 42.5 - 95.6 %   Platelets 206  150 - 400 K/uL  COMPREHENSIVE METABOLIC PANEL   Collection Time    06/29/12  5:35 PM      Result Value Range   Sodium 132 (*) 135 - 145 mEq/L   Potassium 3.6  3.5 - 5.1 mEq/L   Chloride 99  96 - 112 mEq/L   CO2 23  19 - 32 mEq/L   Glucose, Bld 91  70 - 99 mg/dL   BUN 7  6 - 23 mg/dL   Creatinine, Ser 3.87  0.50 - 1.10 mg/dL   Calcium 8.9  8.4 - 56.4 mg/dL   Total Protein 7.8  6.0 -  8.3 g/dL   Albumin 3.1 (*) 3.5 - 5.2 g/dL   AST 17  0 - 37 U/L   ALT 7  0 - 35 U/L   Alkaline Phosphatase 80  39 - 117 U/L   Total Bilirubin 0.2 (*) 0.3 - 1.2 mg/dL   GFR calc non Af Amer >90  >90 mL/min   GFR calc Af Amer >90  >90 mL/min  RPR   Collection Time    06/29/12  5:35 PM      Result Value Range   RPR NON REACTIVE  NON REACTIVE  RAPID HIV SCREEN ([facility])   Collection Time    06/29/12  5:35 PM      Result Value Range   SUDS Rapid HIV Screen NON REACTIVE  NON REACTIVE    Imaging Studies:    Anatomy exam to be read by oncoming radiologist.  [place] Tech reported cervix to be 2.4 cm  Medications:  Scheduled . betamethasone acetate-betamethasone sodium phosphate  12 mg Intramuscular Q24 Hr x 2  . [START ON 07/01/2012] cephALEXin  500 mg Oral Q12H  . docusate sodium  100 mg Oral Daily  . labetalol  100 mg Oral BID  . nitrofurantoin (macrocrystal-monohydrate)  100 mg Oral Q12H  . prenatal multivitamin  1 tablet Oral Q1200  . progesterone  200 mg Vaginal Daily  . sodium chloride  3 mL Intravenous Q12H     ASSESSMENT: 22 yo female with threatened preterm labor, short cervix, and  UTI  PLAN: Continue Betamethasone Start MAcrobid (last UTI was ecoli sensitive to macrobid (resistant to bactrim) Vaginal prometrium for short cervix 24 hour urine GCT FFN negative, can consider discharge after second betamethasone and finish 24 hour urine if cervix exam is stable.  Becky Torres H. 06/30/2012,6:48 AM

## 2012-07-01 DIAGNOSIS — O4700 False labor before 37 completed weeks of gestation, unspecified trimester: Secondary | ICD-10-CM | POA: Diagnosis present

## 2012-07-01 DIAGNOSIS — O26879 Cervical shortening, unspecified trimester: Secondary | ICD-10-CM

## 2012-07-01 LAB — CREATININE, URINE, 24 HOUR
Collection Interval-UCRE24: 24 hours
Creatinine, 24H Ur: 1176 mg/d (ref 700–1800)
Creatinine, Urine: 74.7 mg/dL

## 2012-07-01 LAB — URINE CULTURE: Colony Count: >=100000

## 2012-07-01 MED ORDER — DSS 100 MG PO CAPS: 100.0000 mg | ORAL_CAPSULE | Freq: Every day | ORAL | Status: DC

## 2012-07-01 MED ORDER — PROGESTERONE MICRONIZED 200 MG PO CAPS: ORAL_CAPSULE | ORAL | Status: DC

## 2012-07-01 MED ORDER — LABETALOL HCL 100 MG PO TABS: 100.0000 mg | ORAL_TABLET | Freq: Two times a day (BID) | ORAL | Status: DC

## 2012-07-01 MED ORDER — NITROFURANTOIN MONOHYD MACRO 100 MG PO CAPS: 100.0000 mg | ORAL_CAPSULE | Freq: Two times a day (BID) | ORAL | Status: DC

## 2012-07-01 NOTE — Discharge Summary (Addendum)
Physician Discharge Summary  Patient ID: Becky Torres MRN: [id number] DOB/AGE: 25-Mar-1991 21 y.o.  Admit date: 06/29/2012 Discharge date: 07/01/2012  Admission Diagnoses: preterm labor  Discharge Diagnoses: preterm contractions and short cervix Active Problems:   * No active hospital problems. *   Discharged Condition: good  Hospital Course: Patient with chronic hypertension and no prenatal care who was admitted secondary to preterm contractions. During her hospitalization, patient received betamethasone and started on vaginal prometrium. Her contractions improved and her BP were well controlled. On HD#2 patient was felt stable for discharge. Modified bed rest instructions were provided to the patient. Patient will initiate prenatal care at [facility]  Consults: None  Significant Diagnostic Studies: radiology: Ultrasound: cervical length 2.4 with negative FFN  Treatments: IV hydration, steroids: betamethasone and bedrest  Discharge Exam: Blood pressure 105/51, pulse 105, temperature 97.9 F (36.6 C), temperature source Oral, resp. rate 18, height 5\' 3"  (1.6 m), weight 149 lb 6.4 oz (67.767 kg), last menstrual period 01/07/2012, SpO2 100.00%. General appearance: alert, cooperative and no distress GI: soft, gravid, NT Pelvic: cervix: FT/thick/posterior Extremities: Homans sign is negative, no sign of DVT and no edema, redness or tenderness in the calves or thighs Toco: irregular contractions Disposition: 01-Home or Self Care     Medication List    TAKE these medications       DSS 100 MG Caps  Take 100 mg by mouth daily.     labetalol 100 MG tablet  Commonly known as:  NORMODYNE  Take 1 tablet (100 mg total) by mouth 2 (two) times daily.     nitrofurantoin (macrocrystal-monohydrate) 100 MG capsule  Commonly known as:  MACROBID  Take 1 capsule (100 mg total) by mouth every 12 (twelve) hours.     prenatal multivitamin Tabs  Take 1 tablet by mouth daily at  12 noon.     progesterone 200 MG capsule  Commonly known as:  PROMETRIUM  1 capsule per vaginal at bedtime daily           Follow-up Information   Follow up with [facility]. (You will be contacted with an appointment to initiate care in our clinic)    Contact information:   [address] [place] [address] [phone]      Signed: Lagina Reader 07/01/2012, 7:49 AM

## 2012-07-02 ENCOUNTER — Other Ambulatory Visit: Payer: Self-pay | Admitting: Advanced Practice Midwife

## 2012-07-02 LAB — CULTURE, BETA STREP (GROUP B ONLY)

## 2012-07-02 MED ORDER — CEPHALEXIN 500 MG PO CAPS: 500.0000 mg | ORAL_CAPSULE | Freq: Two times a day (BID) | ORAL | Status: DC

## 2012-07-02 NOTE — H&P (Signed)
Becky Torres is a 22 y.o. G2P1001 at presenting at 26.6 for abdominal pain   Fetal presentation is unsure.  History of Present Illness: 22 year old G2P1001 @ [redacted]w[redacted]d by 14 week ultrasound who has had not prenatal care and presents with left lower abdominal pain. The pain started two days ago and radiates to her back and left inguinal area. It is exacerbated by walking. It is alleviated by nothing. It is not accompanied by nausea, vomiting, diarrhea, vaginal bleeding or liquid discharge, or the sensation of contractions. She denies any recent trama or heavy lifting. She has been compliant with her home labetalol and pre-natal vitamins. She denies any alcohol, drug or tobacco use.   Patient reports the fetal movement as active. Patient reports uterine contraction  activity as none. Patient reports  vaginal bleeding as none. Patient describes fluid per vagina as None.  Patient Active Problem List  Diagnosis  . Short cervix affecting pregnancy  . Preterm uterine contractions, antepartum    Past Medical History  Diagnosis Date  . Hypertension      Past Surgical History: History reviewed. No pertinent past surgical history.  Obstetrical History: OB History   Grav Para Term Preterm Abortions TAB SAB Ect Mult Living   2 1 1       1       Social History: History   Social History  . Marital Status: Single    Spouse Name: N/A    Number of Children: N/A  . Years of Education: N/A   Occupational History  . Not on file.   Social History Main Topics  . Smoking status: Never Smoker   . Smokeless tobacco: Not on file  . Alcohol Use: No  . Drug Use: No  . Sexually Active: Yes    Birth Control/ Protection: None   Other Topics Concern  . Not on file   Social History Narrative  . No narrative on file    Family History: No family history on file.   Allergies: No Known Allergies 0  No prescriptions prior to admission   Review of Systems  Constitutional: Negative for  fever, chills and weight loss.  Eyes: Negative for blurred vision.  Respiratory: Negative for shortness of breath.  Gastrointestinal: Negative for abdominal pain.  Genitourinary: Negative for dysuria.  Musculoskeletal: Positive for back pain.  Neurological: Negative for headaches.    VITALS:  Blood pressure 134/90, pulse 105, temperature 98.2 F (36.8 C), temperature source Oral, resp. rate 16, height 5\' 3"  (1.6 m), weight 67.767 kg (149 lb 6.4 oz), last menstrual period 01/07/2012, SpO2 100.00%.    Physical Exam  Constitutional: She is oriented to person, place, and time. She appears well-developed and well-nourished. No distress.  HENT:  Head: Normocephalic and atraumatic.  Mouth/Throat: Oropharynx is clear and moist. No oropharyngeal exudate.  Eyes: Pupils are equal, round, and reactive to light.  Neck: Normal range of motion.  Cardiovascular: Normal rate, regular rhythm and normal heart sounds.  Respiratory: Effort normal and breath sounds normal.  GI: There is no tenderness.  Gravid; Uterus measures 24cm  Genitourinary: Vagina normal. There is no lesion on the right labia. There is no lesion on the left labia. Cervix exhibits discharge. No bleeding around the vagina.  Mucousy white discharge  Musculoskeletal: She exhibits no edema.  Neurological: She is alert and oriented to person, place, and time.  Skin: Skin is warm and dry. She is not diaphoretic.  Psychiatric: She has a normal mood and affect. Her  behavior is normal. Judgment and thought content normal.   CERVICAL EXAM Dilation: 1  Effacement (%): Thick  Exam by:: Dr. Penne Lash, Lynnell Chad  Membranes: intact  Fetal Monitoring: 135, mod var, accels present, occ variable  Labs: FFN negative Urine dipstick shows positive for nitrates, leukocytes.  Micro exam: 3-6 WBC's per HPF.  Imaging Studies: none  ASSESSMENT: 22 year old G2P1001 @ [redacted]w[redacted]d with no prenatal care and chronic hypertension who presents with  abdominal pain and mild dilation of the cervix  - Admit for evaluation of preterm labor: obtain fetal fibronectin, place on monitors, give betamethasone IM x 2  - Chronic HTN: continue labetalol 200 mg daily, check CBC, CMET, 24 hours urine protein  - Chlamydia Infection: treated in December, obtain test of cure  - PPX: SCD's  Napoleon Form, MD

## 2012-07-02 NOTE — MAU Provider Note (Signed)
I reviewed resident note, labs, imaging and FHTs and agree with above. Patient admitted to Antenatal. See H&P same date. Napoleon Form, MD

## 2012-07-02 NOTE — Progress Notes (Signed)
Urine culture on 2/27 + e coli, on macrobid, intermediate sensitivity, will call in Keflex 500 mg 1 po bid x 7 days.

## 2012-07-04 LAB — HEPATITIS B E ANTIBODY: Hep B E Ab: NEGATIVE

## 2012-07-05 ENCOUNTER — Other Ambulatory Visit: Payer: Self-pay | Admitting: Obstetrics & Gynecology

## 2012-07-05 MED ORDER — CEPHALEXIN 500 MG PO CAPS: 500.0000 mg | ORAL_CAPSULE | Freq: Four times a day (QID) | ORAL | Status: DC

## 2012-07-05 NOTE — Progress Notes (Signed)
Called patient, no answer- left message stating that based off her most recent urine culture she has a urinary tract infection and an antibiotic has been called in to her [facility] pharmacy on [address] and if she has any problems with her medication or any questions to please give [place] a call back at the clinics

## 2012-07-05 NOTE — Progress Notes (Signed)
Pt should be on qid keflex for uti in pregnancy.  New Rx at pharmacy

## 2012-07-05 NOTE — H&P (Signed)
Pt seen and examined Agree with above note.  LEGGETT,KELLY H. 07/05/2012 12:27 PM  

## 2012-07-24 ENCOUNTER — Other Ambulatory Visit: Payer: Self-pay | Admitting: Obstetrics and Gynecology

## 2012-07-24 ENCOUNTER — Ambulatory Visit (INDEPENDENT_AMBULATORY_CARE_PROVIDER_SITE_OTHER): Payer: Self-pay | Admitting: Obstetrics and Gynecology

## 2012-07-24 ENCOUNTER — Encounter: Payer: Self-pay | Admitting: Obstetrics and Gynecology

## 2012-07-24 VITALS — BP 113/76 | Temp 97.3°F | Wt 150.4 lb

## 2012-07-24 DIAGNOSIS — O139 Gestational [pregnancy-induced] hypertension without significant proteinuria, unspecified trimester: Secondary | ICD-10-CM

## 2012-07-24 DIAGNOSIS — O169 Unspecified maternal hypertension, unspecified trimester: Secondary | ICD-10-CM | POA: Insufficient documentation

## 2012-07-24 DIAGNOSIS — O98319 Other infections with a predominantly sexual mode of transmission complicating pregnancy, unspecified trimester: Secondary | ICD-10-CM

## 2012-07-24 DIAGNOSIS — O10913 Unspecified pre-existing hypertension complicating pregnancy, third trimester: Secondary | ICD-10-CM | POA: Insufficient documentation

## 2012-07-24 DIAGNOSIS — O163 Unspecified maternal hypertension, third trimester: Secondary | ICD-10-CM

## 2012-07-24 DIAGNOSIS — Z3480 Encounter for supervision of other normal pregnancy, unspecified trimester: Secondary | ICD-10-CM | POA: Insufficient documentation

## 2012-07-24 DIAGNOSIS — A568 Sexually transmitted chlamydial infection of other sites: Secondary | ICD-10-CM | POA: Insufficient documentation

## 2012-07-24 DIAGNOSIS — Z3483 Encounter for supervision of other normal pregnancy, third trimester: Secondary | ICD-10-CM

## 2012-07-24 DIAGNOSIS — O10911 Unspecified pre-existing hypertension complicating pregnancy, first trimester: Secondary | ICD-10-CM | POA: Insufficient documentation

## 2012-07-24 DIAGNOSIS — I10 Essential (primary) hypertension: Secondary | ICD-10-CM

## 2012-07-24 LAB — POCT URINALYSIS DIP (DEVICE)
Bilirubin Urine: NEGATIVE
Glucose, UA: NEGATIVE mg/dL
Hgb urine dipstick: NEGATIVE
Ketones, ur: NEGATIVE mg/dL
Nitrite: NEGATIVE
pH: 6 (ref 5.0–8.0)

## 2012-07-24 NOTE — Progress Notes (Signed)
Nutrition note: 1st visit consult Pt has gained 25.4# @ [redacted]w[redacted]d, which is slightly > expected. Pt reports eating 3 meals & 4-5 snacks/d. Pt reports no N&V or heartburn. Pt reports NKFA. Pt is taking PNV. Pt received verbal & written education on general nutrition during pregnancy. Encouraged protein with all meals & snacks. Disc wt gain goals of 25-35# or 1#/wk. Pt agrees to continue taking PNV. Pt does not have WIC but plans to apply. Pt plans to BF. F/u if referred Blondell Reveal, MS, RD, LDN

## 2012-07-24 NOTE — Progress Notes (Signed)
Patient doing well without complaints. Continue daily prometrium. BP well controlled on labetalol. Unable to stay for 1 hr GCT today but will return this week. Will start twice weekly NST at next visit. FM/PTL precautions reviewed. Cervical exam unchanged from February admission. 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 Patient is sitting most of the time.

## 2012-07-25 ENCOUNTER — Other Ambulatory Visit: Payer: Self-pay

## 2012-08-07 ENCOUNTER — Other Ambulatory Visit: Payer: Self-pay

## 2012-08-10 ENCOUNTER — Other Ambulatory Visit: Payer: Self-pay

## 2012-08-11 ENCOUNTER — Ambulatory Visit (INDEPENDENT_AMBULATORY_CARE_PROVIDER_SITE_OTHER): Payer: Self-pay | Admitting: *Deleted

## 2012-08-11 VITALS — BP 119/73 | Wt 154.4 lb

## 2012-08-11 DIAGNOSIS — O169 Unspecified maternal hypertension, unspecified trimester: Secondary | ICD-10-CM

## 2012-08-11 NOTE — Progress Notes (Signed)
NST reviewed and reactive.  Chea Malan L. Harraway-Smith, M.D., FACOG    

## 2012-08-11 NOTE — Progress Notes (Signed)
P = 89  Attempted to have 1hr GTT today- pt vomited glucola- will try again on 4/14.

## 2012-08-14 ENCOUNTER — Other Ambulatory Visit: Payer: Self-pay

## 2012-08-14 ENCOUNTER — Other Ambulatory Visit: Payer: Self-pay | Admitting: Family Medicine

## 2012-08-14 ENCOUNTER — Encounter: Payer: Self-pay | Admitting: Obstetrics and Gynecology

## 2012-08-14 LAB — POCT URINALYSIS DIP (DEVICE)
Bilirubin Urine: NEGATIVE
Ketones, ur: NEGATIVE mg/dL
Protein, ur: 30 mg/dL — AB
Specific Gravity, Urine: 1.025 (ref 1.005–1.030)
pH: 6 (ref 5.0–8.0)

## 2012-08-17 ENCOUNTER — Other Ambulatory Visit: Payer: Self-pay

## 2012-08-24 ENCOUNTER — Ambulatory Visit (INDEPENDENT_AMBULATORY_CARE_PROVIDER_SITE_OTHER): Payer: Self-pay | Admitting: Obstetrics & Gynecology

## 2012-08-24 ENCOUNTER — Other Ambulatory Visit: Payer: Self-pay | Admitting: Family Medicine

## 2012-08-24 VITALS — BP 121/81 | Wt 157.8 lb

## 2012-08-24 DIAGNOSIS — O163 Unspecified maternal hypertension, third trimester: Secondary | ICD-10-CM

## 2012-08-24 DIAGNOSIS — I10 Essential (primary) hypertension: Secondary | ICD-10-CM

## 2012-08-24 DIAGNOSIS — O139 Gestational [pregnancy-induced] hypertension without significant proteinuria, unspecified trimester: Secondary | ICD-10-CM

## 2012-08-24 DIAGNOSIS — O169 Unspecified maternal hypertension, unspecified trimester: Secondary | ICD-10-CM

## 2012-08-24 LAB — POCT URINALYSIS DIP (DEVICE)
Glucose, UA: NEGATIVE mg/dL
Hgb urine dipstick: NEGATIVE
Ketones, ur: NEGATIVE mg/dL
Specific Gravity, Urine: 1.02 (ref 1.005–1.030)
Urobilinogen, UA: 1 mg/dL (ref 0.0–1.0)

## 2012-08-24 NOTE — Progress Notes (Signed)
Pt encouraged to increase po water intake due to low nml AFV.  Also advised of importance of twice weekly fetal testing. Pt states she has had poor transportation and now feels she has it worked out.

## 2012-08-24 NOTE — Progress Notes (Signed)
NST performed today was reviewed and was found to be reactive.  AFI 8.9 cm.  Followup growth scan scheduled.  Continue recommended antenatal testing and prenatal care. No other complaints or concerns.  Fetal movement and labor precautions reviewed.

## 2012-08-24 NOTE — Patient Instructions (Signed)
Return to clinic for any obstetric concerns or go to MAU for evaluation  

## 2012-08-24 NOTE — Progress Notes (Signed)
P=90, c/o pain in legs sometimes"like I been doing squats all day" states may be due to chasing toddler,  Forgot to bring jelly beans to do 1 hr.gtt . Will try to bring Monday with nst.

## 2012-08-28 ENCOUNTER — Ambulatory Visit (INDEPENDENT_AMBULATORY_CARE_PROVIDER_SITE_OTHER): Payer: Self-pay | Admitting: *Deleted

## 2012-08-28 VITALS — BP 123/83

## 2012-08-28 DIAGNOSIS — O139 Gestational [pregnancy-induced] hypertension without significant proteinuria, unspecified trimester: Secondary | ICD-10-CM

## 2012-08-28 DIAGNOSIS — O163 Unspecified maternal hypertension, third trimester: Secondary | ICD-10-CM

## 2012-08-28 NOTE — Progress Notes (Signed)
P = 95   1hr GTT done today

## 2012-08-28 NOTE — Addendum Note (Signed)
Addended by: Franchot Mimes on: 08/28/2012 03:28 PM   Modules accepted: Orders

## 2012-08-29 LAB — GLUCOSE TOLERANCE, 1 HOUR (50G) W/O FASTING: Glucose, 1 Hour GTT: 104 mg/dL (ref 70–140)

## 2012-08-30 ENCOUNTER — Encounter: Payer: Self-pay | Admitting: Obstetrics & Gynecology

## 2012-08-30 NOTE — Progress Notes (Signed)
4/28 NST reviewed and reactive 

## 2012-08-31 ENCOUNTER — Encounter: Payer: Self-pay | Admitting: Obstetrics & Gynecology

## 2012-08-31 ENCOUNTER — Ambulatory Visit (HOSPITAL_COMMUNITY): Payer: MEDICAID

## 2012-08-31 ENCOUNTER — Other Ambulatory Visit: Payer: Self-pay

## 2012-08-31 ENCOUNTER — Ambulatory Visit (HOSPITAL_COMMUNITY): Admission: RE | Admit: 2012-08-31 | Payer: MEDICAID | Source: Ambulatory Visit

## 2012-09-04 ENCOUNTER — Other Ambulatory Visit: Payer: Self-pay

## 2012-09-19 ENCOUNTER — Encounter (HOSPITAL_COMMUNITY): Payer: Self-pay | Admitting: *Deleted

## 2012-09-19 ENCOUNTER — Inpatient Hospital Stay (HOSPITAL_COMMUNITY): Payer: Medicaid Other | Admitting: Anesthesiology

## 2012-09-19 ENCOUNTER — Encounter (HOSPITAL_COMMUNITY): Payer: Self-pay | Admitting: Anesthesiology

## 2012-09-19 ENCOUNTER — Inpatient Hospital Stay (HOSPITAL_COMMUNITY)
Admission: AD | Admit: 2012-09-19 | Discharge: 2012-09-21 | DRG: 774 | Disposition: A | Discharge status: Home / Self Care | Payer: Medicaid Other | Source: Ambulatory Visit | Attending: Obstetrics and Gynecology | Admitting: Obstetrics and Gynecology

## 2012-09-19 DIAGNOSIS — O4700 False labor before 37 completed weeks of gestation, unspecified trimester: Secondary | ICD-10-CM

## 2012-09-19 DIAGNOSIS — O1002 Pre-existing essential hypertension complicating childbirth: Secondary | ICD-10-CM

## 2012-09-19 DIAGNOSIS — Z3483 Encounter for supervision of other normal pregnancy, third trimester: Secondary | ICD-10-CM

## 2012-09-19 DIAGNOSIS — O26879 Cervical shortening, unspecified trimester: Secondary | ICD-10-CM

## 2012-09-19 DIAGNOSIS — IMO0001 Reserved for inherently not codable concepts without codable children: Secondary | ICD-10-CM

## 2012-09-19 DIAGNOSIS — O163 Unspecified maternal hypertension, third trimester: Secondary | ICD-10-CM

## 2012-09-19 DIAGNOSIS — O0930 Supervision of pregnancy with insufficient antenatal care, unspecified trimester: Secondary | ICD-10-CM

## 2012-09-19 DIAGNOSIS — O98313 Other infections with a predominantly sexual mode of transmission complicating pregnancy, third trimester: Secondary | ICD-10-CM

## 2012-09-19 LAB — RPR: RPR Ser Ql: NONREACTIVE

## 2012-09-19 LAB — CBC
MCH: 19.4 pg — ABNORMAL LOW (ref 26.0–34.0)
Platelets: 138 10*3/uL — ABNORMAL LOW (ref 150–400)
RBC: 4.85 MIL/uL (ref 3.87–5.11)
RDW: 19.1 % — ABNORMAL HIGH (ref 11.5–15.5)
WBC: 10.9 10*3/uL — ABNORMAL HIGH (ref 4.0–10.5)

## 2012-09-19 LAB — OB RESULTS CONSOLE GBS: GBS: POSITIVE

## 2012-09-19 MED ORDER — OXYCODONE-ACETAMINOPHEN 5-325 MG PO TABS: 1.0000 | ORAL_TABLET | ORAL | Status: DC | PRN

## 2012-09-19 MED ORDER — ONDANSETRON HCL 4 MG/2ML IJ SOLN
4.0000 mg | Freq: Four times a day (QID) | INTRAMUSCULAR | Status: DC | PRN
  Administered 2012-09-19: 4 mg via INTRAVENOUS
  Filled 2012-09-19: qty 2

## 2012-09-19 MED ORDER — EPHEDRINE 5 MG/ML INJ
10.0000 mg | INTRAVENOUS | Status: DC | PRN
  Filled 2012-09-19: qty 4
  Filled 2012-09-19: qty 2

## 2012-09-19 MED ORDER — PHENYLEPHRINE 40 MCG/ML (10ML) SYRINGE FOR IV PUSH (FOR BLOOD PRESSURE SUPPORT)
80.0000 ug | PREFILLED_SYRINGE | INTRAVENOUS | Status: DC | PRN
  Filled 2012-09-19: qty 2

## 2012-09-19 MED ORDER — DIPHENHYDRAMINE HCL 50 MG/ML IJ SOLN: 12.5000 mg | INTRAMUSCULAR | Status: DC | PRN

## 2012-09-19 MED ORDER — IBUPROFEN 600 MG PO TABS
600.0000 mg | ORAL_TABLET | Freq: Four times a day (QID) | ORAL | Status: DC | PRN
  Administered 2012-09-20: 600 mg via ORAL
  Filled 2012-09-19: qty 1

## 2012-09-19 MED ORDER — LABETALOL HCL 100 MG PO TABS
100.0000 mg | ORAL_TABLET | Freq: Two times a day (BID) | ORAL | Status: DC
  Administered 2012-09-19 – 2012-09-21 (×5): 100 mg via ORAL
  Filled 2012-09-19 (×5): qty 1

## 2012-09-19 MED ORDER — SODIUM BICARBONATE 8.4 % IV SOLN
INTRAVENOUS | Status: DC | PRN
  Administered 2012-09-19: 5 mL via EPIDURAL

## 2012-09-19 MED ORDER — OXYTOCIN 40 UNITS IN LACTATED RINGERS INFUSION - SIMPLE MED
62.5000 mL/h | INTRAVENOUS | Status: DC
  Filled 2012-09-19: qty 1000

## 2012-09-19 MED ORDER — LACTATED RINGERS IV SOLN: 500.0000 mL | INTRAVENOUS | Status: DC | PRN

## 2012-09-19 MED ORDER — FENTANYL 2.5 MCG/ML BUPIVACAINE 1/10 % EPIDURAL INFUSION (WH - ANES)
14.0000 mL/h | INTRAMUSCULAR | Status: DC | PRN
  Administered 2012-09-19 (×2): 14 mL/h via EPIDURAL
  Filled 2012-09-19 (×2): qty 125

## 2012-09-19 MED ORDER — OXYTOCIN BOLUS FROM INFUSION
500.0000 mL | INTRAVENOUS | Status: DC
  Administered 2012-09-19: 500 mL via INTRAVENOUS

## 2012-09-19 MED ORDER — NALBUPHINE SYRINGE 5 MG/0.5 ML
5.0000 mg | INJECTION | INTRAMUSCULAR | Status: DC | PRN
  Filled 2012-09-19: qty 0.5

## 2012-09-19 MED ORDER — ACETAMINOPHEN 325 MG PO TABS: 650.0000 mg | ORAL_TABLET | ORAL | Status: DC | PRN

## 2012-09-19 MED ORDER — PHENYLEPHRINE 40 MCG/ML (10ML) SYRINGE FOR IV PUSH (FOR BLOOD PRESSURE SUPPORT)
80.0000 ug | PREFILLED_SYRINGE | INTRAVENOUS | Status: DC | PRN
  Filled 2012-09-19: qty 5
  Filled 2012-09-19: qty 2

## 2012-09-19 MED ORDER — LACTATED RINGERS IV SOLN
INTRAVENOUS | Status: DC
  Administered 2012-09-19: 12:00:00 via INTRAVENOUS

## 2012-09-19 MED ORDER — LIDOCAINE HCL (PF) 1 % IJ SOLN
30.0000 mL | INTRAMUSCULAR | Status: DC | PRN
  Filled 2012-09-19 (×2): qty 30

## 2012-09-19 MED ORDER — CITRIC ACID-SODIUM CITRATE 334-500 MG/5ML PO SOLN: 30.0000 mL | ORAL | Status: DC | PRN

## 2012-09-19 MED ORDER — PENICILLIN G POTASSIUM 5000000 UNITS IJ SOLR
5.0000 10*6.[IU] | Freq: Once | INTRAVENOUS | Status: AC
  Administered 2012-09-19: 5 10*6.[IU] via INTRAVENOUS
  Filled 2012-09-19: qty 5

## 2012-09-19 MED ORDER — LACTATED RINGERS IV SOLN
500.0000 mL | Freq: Once | INTRAVENOUS | Status: AC
  Administered 2012-09-19: 1000 mL via INTRAVENOUS

## 2012-09-19 MED ORDER — EPHEDRINE 5 MG/ML INJ
10.0000 mg | INTRAVENOUS | Status: DC | PRN
  Administered 2012-09-19: 5 mg via INTRAVENOUS
  Filled 2012-09-19: qty 2

## 2012-09-19 MED ORDER — PENICILLIN G POTASSIUM 5000000 UNITS IJ SOLR
2.5000 10*6.[IU] | INTRAVENOUS | Status: DC
  Administered 2012-09-19: 2.5 10*6.[IU] via INTRAVENOUS
  Filled 2012-09-19 (×6): qty 2.5

## 2012-09-19 NOTE — Progress Notes (Signed)
Becky Torres is a 22 y.o. G2P1001 at [redacted]w[redacted]d.  Subjective: Comfortable w/ epidural  Objective: BP 109/80  Pulse 83  Temp(Src) 98.1 F (36.7 C) (Oral)  Resp 18  Ht 5\' 3"  (1.6 m)  Wt 73.029 kg (161 lb)  BMI 28.53 kg/m2  SpO2 100%  LMP 01/07/2012 I/O last 3 completed shifts: In: -  Out: 75 [Urine:75]    FHT:  FHR: 125 bpm, variability: moderate,  accelerations:  Present,  decelerations:  Absent UC:   regular, every 2-4 minutes, moderate SVE:   Dilation: 6 Effacement (%): 80 Station: -1 Exam by:: V Sita Mangen CNM Anterior AROM moderate amount of clear fluid.  Labs: Lab Results  Component Value Date   WBC 10.9* 09/19/2012   HGB 9.4* 09/19/2012   HCT 30.7* 09/19/2012   MCV 63.3* 09/19/2012   PLT 138* 09/19/2012    Assessment / Plan: Protracted active phase  Labor: progressing slowly. Assess Progress after AROM.  Preeclampsia:  NA Fetal Wellbeing:  Category I Pain Control:  Epidural I/D:  n/a Anticipated MOD:  NSVD  Tirso Laws 09/19/2012, 8:58 PM

## 2012-09-19 NOTE — Anesthesia Preprocedure Evaluation (Addendum)
Anesthesia Evaluation  Patient identified by MRN, date of birth, ID band Patient awake    Reviewed: Allergy & Precautions, H&P , Patient's Chart, lab work & pertinent test results  Airway Mallampati: II  TM Distance: >3 FB Neck ROM: full    Dental  (+) Teeth Intact   Pulmonary  breath sounds clear to auscultation        Cardiovascular hypertension, Rhythm:regular Rate:Normal     Neuro/Psych    GI/Hepatic   Endo/Other    Renal/GU      Musculoskeletal   Abdominal   Peds  Hematology  (+) anemia ,   Anesthesia Other Findings       Reproductive/Obstetrics (+) Pregnancy                             Anesthesia Physical Anesthesia Plan  ASA: II  Anesthesia Plan: Epidural   Post-op Pain Management:    Induction:   Airway Management Planned:   Additional Equipment:   Intra-op Plan:   Post-operative Plan:   Informed Consent: I have reviewed the patients History and Physical, chart, labs and discussed the procedure including the risks, benefits and alternatives for the proposed anesthesia with the patient or authorized representative who has indicated his/her understanding and acceptance.   Dental Advisory Given  Plan Discussed with:   Anesthesia Plan Comments: (Labs checked- platelets confirmed with RN in room. Fetal heart tracing, per RN, reported to be stable enough for sitting procedure. Discussed epidural, and patient consents to the procedure:  included risk of possible headache,backache, failed block, allergic reaction, and nerve injury. This patient was asked if she had any questions or concerns before the procedure started.)        Anesthesia Quick Evaluation  

## 2012-09-19 NOTE — MAU Provider Note (Signed)
  History     CSN: [id number]  Arrival date and time: 09/19/12 4098   First Provider Initiated Contact with Patient 09/19/12 1102      Chief Complaint  Patient presents with  . Labor Eval   HPI Becky Torres is a 22 y.o. G2P1001 at [redacted]w[redacted]d with history of preterm contractions, chronic HTN, and short cervix who presents with complaints of contractions.  She reports that yesterday evening she began to have back pain after a long day of activity.  This am the pain persisted and she felt that she was having contractions.  She timed her contractions and they were occurring every 10 mins.  Given regularity and frequency of contractions she came to the MAU for evaluation.    ROS: See section below  Past Medical History  Diagnosis Date  . Hypertension     History reviewed. No pertinent past surgical history.  Family History  Problem Relation Age of Onset  . Hypertension Mother   . Hypertension Brother   . Hypertension Maternal Aunt   . Hypertension Maternal Grandmother   . Diabetes Paternal Grandmother     History  Substance Use Topics  . Smoking status: Never Smoker   . Smokeless tobacco: Not on file  . Alcohol Use: No    Allergies: No Known Allergies  Prescriptions prior to admission  Medication Sig Dispense Refill  . labetalol (NORMODYNE) 100 MG tablet Take 1 tablet (100 mg total) by mouth 2 (two) times daily.  60 tablet  3  . Prenatal Vit-Fe Fumarate-FA (PRENATAL MULTIVITAMIN) TABS Take 1 tablet by mouth daily at 12 noon.        Review of Systems  Constitutional: Negative for fever and chills.  Eyes: Negative for blurred vision.  Respiratory: Negative for shortness of breath.   Cardiovascular: Negative for chest pain and leg swelling.  Gastrointestinal: Negative for nausea, vomiting and abdominal pain.  Genitourinary: Negative for dysuria.  Neurological: Negative for headaches.    Physical Exam   Blood pressure 117/74, pulse 120, temperature 97.7 F (36.5  C), resp. rate 20, height 5\' 3"  (1.6 m), weight 73.029 kg (161 lb), last menstrual period 01/07/2012.  Physical Exam Gen: well appearing, comfortable, NAD. Heart: Tachycardic. Regular rhythm. No murmurs, rubs, or gallops. Lungs: CTAB. No rales, rhonchi, or wheezing. Abd: gravid but otherwise soft, nontender to palpation Ext: no appreciable lower extremity edema bilaterally Neuro: No focal deficits GU: normal appearing external genitalia Cervix: Dilation: 4.5 Effacement (%): 80 Cervical Position: Middle Station: -2 Presentation: Vertex Exam by:: Dr. Adriana Simas  MAU Course  Procedures  Assessment and Plan   22 y.o. G2P1001 at [redacted]w[redacted]d with history of preterm contractions, chronic HTN, and short cervix who presents with spontaneous onset of labor.  - Will admit to [facility] - Pain control: Planning Epidural - BP currently controlled. Will continue Labetolol. - GBS unknown; Will order GBS PCR - Anticipate NSVD  Becky Torres 09/19/2012, 11:02 AM   I was present for the exam and agree with above. FHR category I. Moderate regular UC's.   [place], CNM 09/20/2012 11:49 AM

## 2012-09-19 NOTE — H&P (Signed)
Becky Torres is a 22 y.o. female G2P1001 at [redacted]w[redacted]d with history of preterm contractions, chronic HTN, and short cervix who presents with complaints of contractions.  She reports that yesterday evening she began to have back pain after a long day of activity. This am the pain persisted and she felt that she was having contractions. She timed her contractions and they were occurring every 10 mins. Given regularity and frequency of contractions she came to the MAU for evaluation  Maternal Medical History:    OB History   Grav Para Term Preterm Abortions TAB SAB Ect Mult Living   2 1 1       1      Past Medical History  Diagnosis Date  . Hypertension    History reviewed. No pertinent past surgical history. Family History: family history includes Diabetes in her paternal grandmother and Hypertension in her brother, maternal aunt, maternal grandmother, and mother. Social History:  reports that she has never smoked. She does not have any smokeless tobacco history on file. She reports that she does not drink alcohol or use illicit drugs.   Prenatal Transfer Tool  Maternal Diabetes: No Genetic Screening: N/A Maternal Ultrasounds/Referrals: Normal other than decreased cervical length. Fetal Ultrasounds or other Referrals:  None Maternal Substance Abuse:  No Significant Maternal Medications:  Meds include Labetalol Significant Maternal Lab Results:  None Other Comments:  CHTN w/ out evidence of Pre-E. Very limited PNC. I true visit. Minimal compliance w/ antenatal testing.   Review of Systems  Constitutional: Negative for fever and chills.  Eyes: Negative for blurred vision.  Respiratory: Negative for shortness of breath.   Cardiovascular: Negative for chest pain.  Gastrointestinal: Negative for vomiting and abdominal pain.  Genitourinary: Negative for dysuria.  Neurological: Negative for headaches.    Dilation: 4.5 Effacement (%): 80 Station: -2 Exam by:: Dr. Adriana Simas Blood pressure  117/74, pulse 120, temperature 97.7 F (36.5 C), resp. rate 20, height 5\' 3"  (1.6 m), weight 73.029 kg (161 lb), last menstrual period 01/07/2012. Exam Physical Exam  Gen: well appearing, comfortable, NAD.  Heart: Tachycardic. Regular rhythm. No murmurs, rubs, or gallops.  Lungs: CTAB. No rales, rhonchi, or wheezing.  Abd: gravid but otherwise soft, nontender to palpation  Ext: no appreciable lower extremity edema bilaterally  Neuro: No focal deficits  GU: normal appearing external genitalia  Cervix:  Dilation: 4.5  Effacement (%): 80  Cervical Position: Middle  Station: -2  Presentation: Vertex  Exam by:: Dr. Adriana Simas  FHR: baseline 135, mod variability, no decels Toco: 2-7 mins   Prenatal labs: ABO, Rh: --/--/B POS (02/28 2230) Antibody: NEG (02/28 2230) Rubella: 27.50 (02/28 1745) RPR: NON REACTIVE (02/27 1735)  HBsAg: NEGATIVE (02/28 1745)  HIV: Non-reactive (02/27 0000)  GBS:     Assessment/Plan: 22 y.o. G2P1001 at [redacted]w[redacted]d with history of preterm contractions, chronic HTN, and short cervix who presents with spontaneous onset of labor.  - Will admit to [facility]  - Pain control: Planning Epidural  - BP currently controlled. Will continue home Labetolol.  - GBS unknown; Will order GBS PCR  - Anticipate NSVD   Everlene Other 09/19/2012, 11:20 AM  I was present for the exam and agree with above.  [place], CNM 09/20/2012 11:52 AM

## 2012-09-19 NOTE — Progress Notes (Addendum)
Becky Torres is a 22 y.o. G2P1001 at 105w4d. Chlamydia cul;ture pos 06/29/12. Tx 06/30/12. Partner Tx per pt. No TOC done.   Subjective: Comfortable w/ epidural  Objective: BP 106/64  Pulse 88  Temp(Src) 97.5 F (36.4 C) (Oral)  Resp 20  Ht 5\' 3"  (1.6 m)  Wt 73.029 kg (161 lb)  BMI 28.53 kg/m2  SpO2 100%  LMP 01/07/2012      FHT:  FHR: 130 bpm, variability: moderate,  accelerations:  Present,  decelerations:  Absent UC:   Difficult to assess since epidural. AAdjusted  SVE:   Dilation: 5 Effacement (%): 80 Station: -1 Exam by:: Dorathy Kinsman, CNM  Labs: Lab Results  Component Value Date   WBC 10.9* 09/19/2012   HGB 9.4* 09/19/2012   HCT 30.7* 09/19/2012   MCV 63.3* 09/19/2012   PLT 138* 09/19/2012    Assessment / Plan: Spontaneous labor, progressing normally  Labor: Progressing normally Preeclampsia:  NA Fetal Wellbeing:  Category I Pain Control:  Epidural I/D:  n/a Anticipated MOD:  NSVD GBS PCR and GC/CT TOC collected.  Dorathy Kinsman 09/19/2012, 2:49 PM

## 2012-09-19 NOTE — MAU Note (Signed)
Pt states she has had ctx since last night but has got more intense and more often this morning

## 2012-09-19 NOTE — Anesthesia Procedure Notes (Signed)
Epidural Patient location during procedure: OB  Preanesthetic Checklist Completed: patient identified, site marked, surgical consent, pre-op evaluation, timeout performed, IV checked, risks and benefits discussed and monitors and equipment checked  Epidural Patient position: sitting Prep: site prepped and draped and DuraPrep Patient monitoring: continuous pulse ox and blood pressure Approach: midline Injection technique: LOR air  Needle:  Needle type: Tuohy  Needle gauge: 17 G Needle length: 9 cm and 9 Needle insertion depth: 5 cm cm Catheter type: closed end flexible Catheter size: 19 Gauge Catheter at skin depth: 11 cm Test dose: negative  Assessment Events: blood not aspirated, injection not painful, no injection resistance, negative IV test and no paresthesia  Additional Notes Dosing of Epidural:  1st dose, through catheter ............................................. epi 1:200K + Xylocaine 40 mg  2nd dose, through catheter, after waiting 3 minutes.....epi 1:200K + Xylocaine 60 mg    ( 2% Xylo charted as a single dose in Epic Meds for ease of charting; actual dosing was fractionated as above, for saftey's sake)  As each dose occurred, patient was free of IV sx; and patient exhibited no evidence of SA injection.  Patient is more comfortable after epidural dosed. Please see RN's note for documentation of vital signs,and FHR which are stable.  Patient reminded not to try to ambulate with numb legs, and that an RN must be present when she attempts to get up.       

## 2012-09-20 ENCOUNTER — Encounter (HOSPITAL_COMMUNITY): Payer: Self-pay | Admitting: *Deleted

## 2012-09-20 LAB — RAPID URINE DRUG SCREEN, HOSP PERFORMED
Amphetamines: NOT DETECTED
Benzodiazepines: NOT DETECTED
Tetrahydrocannabinol: NOT DETECTED

## 2012-09-20 LAB — GC/CHLAMYDIA PROBE AMP: GC Probe RNA: NEGATIVE

## 2012-09-20 LAB — TYPE AND SCREEN: ABO/RH(D): B POS

## 2012-09-20 MED ORDER — DIPHENHYDRAMINE HCL 25 MG PO CAPS: 25.0000 mg | ORAL_CAPSULE | Freq: Four times a day (QID) | ORAL | Status: DC | PRN

## 2012-09-20 MED ORDER — IBUPROFEN 600 MG PO TABS
600.0000 mg | ORAL_TABLET | Freq: Four times a day (QID) | ORAL | Status: DC
  Administered 2012-09-20 – 2012-09-21 (×6): 600 mg via ORAL
  Filled 2012-09-20 (×6): qty 1

## 2012-09-20 MED ORDER — BENZOCAINE-MENTHOL 20-0.5 % EX AERO: 1.0000 "application " | INHALATION_SPRAY | CUTANEOUS | Status: DC | PRN

## 2012-09-20 MED ORDER — ONDANSETRON HCL 4 MG PO TABS: 4.0000 mg | ORAL_TABLET | ORAL | Status: DC | PRN

## 2012-09-20 MED ORDER — ONDANSETRON HCL 4 MG/2ML IJ SOLN: 4.0000 mg | INTRAMUSCULAR | Status: DC | PRN

## 2012-09-20 MED ORDER — OXYCODONE-ACETAMINOPHEN 5-325 MG PO TABS
1.0000 | ORAL_TABLET | ORAL | Status: DC | PRN
Start: 2012-09-20 — End: 2012-09-21
  Administered 2012-09-20 – 2012-09-21 (×2): 1 via ORAL
  Filled 2012-09-20 (×2): qty 1

## 2012-09-20 MED ORDER — DIBUCAINE 1 % RE OINT: 1.0000 "application " | TOPICAL_OINTMENT | RECTAL | Status: DC | PRN

## 2012-09-20 MED ORDER — ZOLPIDEM TARTRATE 5 MG PO TABS: 5.0000 mg | ORAL_TABLET | Freq: Every evening | ORAL | Status: DC | PRN

## 2012-09-20 MED ORDER — LANOLIN HYDROUS EX OINT: TOPICAL_OINTMENT | CUTANEOUS | Status: DC | PRN

## 2012-09-20 MED ORDER — PRENATAL MULTIVITAMIN CH
1.0000 | ORAL_TABLET | Freq: Every day | ORAL | Status: DC
  Administered 2012-09-20 – 2012-09-21 (×2): 1 via ORAL
  Filled 2012-09-20 (×2): qty 1

## 2012-09-20 MED ORDER — TETANUS-DIPHTH-ACELL PERTUSSIS 5-2.5-18.5 LF-MCG/0.5 IM SUSP: 0.5000 mL | Freq: Once | INTRAMUSCULAR | Status: DC

## 2012-09-20 MED ORDER — WITCH HAZEL-GLYCERIN EX PADS: 1.0000 "application " | MEDICATED_PAD | CUTANEOUS | Status: DC | PRN

## 2012-09-20 MED ORDER — SIMETHICONE 80 MG PO CHEW: 80.0000 mg | CHEWABLE_TABLET | ORAL | Status: DC | PRN

## 2012-09-20 MED ORDER — SENNOSIDES-DOCUSATE SODIUM 8.6-50 MG PO TABS
2.0000 | ORAL_TABLET | Freq: Every day | ORAL | Status: DC
  Administered 2012-09-20: 2 via ORAL

## 2012-09-20 NOTE — Progress Notes (Signed)
Philipp Deputy, CNM in the department. Update given, reassured CBC not need at this time. Will continue to monitor.

## 2012-09-20 NOTE — Clinical Social Work Maternal (Signed)
    Clinical Social Work Department PSYCHOSOCIAL ASSESSMENT - MATERNAL/CHILD 09/20/2012  Patient:  DELCIA, SPITZLEY  Account Number:  [id number]  Admit Date:  09/19/2012  Marjo Bicker Name:   Prince Solian    Clinical Social Worker:  Nobie Putnam, LCSW   Date/Time:  09/20/2012 01:56 PM  Date Referred:  09/20/2012   Referral source  CN     Referred reason  Other - See comment   Other referral source:    I:  FAMILY / HOME ENVIRONMENT Child's legal guardian:  PARENT  Guardian - Name Guardian - Age Guardian - Address  [place] [address] [place], [place] [address]  Rollene Rotunda 27    Other household support members/support persons Name Relationship DOB  Saleen Peden 07/26/10   Other support:   Parents & Grandmother    II  PSYCHOSOCIAL DATA Information Source:  Patient Interview  Event organiser Employment:   Financial resources:  Self Pay If [employer]:   [employer] / Grade:   Maternity Care Coordinator / Child Services Coordination / Early Interventions:  Cultural issues impacting care:    III  STRENGTHS Strengths  Adequate Resources  Home prepared for Child (including basic supplies)  Supportive family/friends   Strength comment:    IV  RISK FACTORS AND CURRENT PROBLEMS Current Problem:  YES   Risk Factor & Current Problem Patient Issue Family Issue Risk Factor / Current Problem Comment  Other - See comment Y N 2 PNV    V  SOCIAL WORK ASSESSMENT CSW referral received to assess reason for pt's limited PNC.  Pt told CSW that she started [facility] 07/24/12 in [facility] clinic at 27 weeks.  She admits to missing at least 4 appointments due to lack of transportation & being sick. She denies any illegal substance use & understands hospital drug testing policy.  UDS collection is pending, as well as meconium results.  Pt does not receive food stamps, WIC or Medicaid.  Since pt was employed during pregnancy, she  never applied for benefits. She will complete a Medicaid application prior to discharge with financial counselor. She has all the necessary supplies for the infant & good support.  FOB is involved, as per pt.  CSW will monitor drug screen results and make a referral if needed.      VI SOCIAL WORK PLAN Social Work Plan  No Further Intervention Required / No Barriers to Discharge   Type of pt/family education:   If child protective services report - county:   If child protective services report - date:   Information/referral to community resources comment:   Other social work plan:

## 2012-09-20 NOTE — Progress Notes (Signed)
UR chart review completed.  

## 2012-09-20 NOTE — Progress Notes (Signed)
Post Partum Day #1 Subjective: no complaints and up ad lib; breastfeeding; desires Nexplanon  Objective: Blood pressure 108/66, pulse 97, temperature 98.7 F (37.1 C), temperature source Oral, resp. rate 18, height 5\' 3"  (1.6 m), weight 161 lb (73.029 kg), last menstrual period 01/07/2012, SpO2 100.00%, unknown if currently breastfeeding.  Physical Exam:  General: alert, cooperative and no distress Lochia: appropriate Uterine Fundus: firm IDVT Evaluation: No evidence of DVT seen on physical exam.   Recent Labs  09/19/12 1150  HGB 9.4*  HCT 30.7*    Assessment/Plan: Plan for discharge tomorrow UDS and SW consult pending (limited PNC) Chlam TOC pending   LOS: 1 day   SHAW, KIMBERLY 09/20/2012, 9:07 AM

## 2012-09-20 NOTE — Anesthesia Postprocedure Evaluation (Signed)
  Anesthesia Post-op Note  Patient: Becky Torres  Procedure(s) Performed: * No procedures listed *  Patient Location: Mother/Baby  Anesthesia Type:Epidural  Level of Consciousness: awake, alert , oriented and patient cooperative  Airway and Oxygen Therapy: Patient Spontanous Breathing  Post-op Pain: mild  Post-op Assessment: Patient's Cardiovascular Status Stable, Respiratory Function Stable, No headache, No backache, No residual numbness and No residual motor weakness  Post-op Vital Signs: stable  Complications: No apparent anesthesia complications

## 2012-09-21 MED ORDER — IBUPROFEN 600 MG PO TABS: 600.0000 mg | ORAL_TABLET | Freq: Four times a day (QID) | ORAL | Status: DC

## 2012-09-21 NOTE — H&P (Signed)
Attestation of Attending Supervision of Advanced Practitioner (CNM/NP): Evaluation and management procedures were performed by the Advanced Practitioner under my supervision and collaboration.  I have reviewed the Advanced Practitioner's note and chart, and I agree with the management and plan.  Sarina Robleto 09/21/2012 12:58 PM

## 2012-09-21 NOTE — Discharge Summary (Signed)
Obstetric Discharge Summary Reason for Admission: onset of labor Prenatal Procedures: none Intrapartum Procedures: spontaneous vaginal delivery Postpartum Procedures: none Complications-Operative and Postpartum: none Hemoglobin  Date Value Range Status  09/19/2012 9.4* 12.0 - 15.0 g/dL Final     HCT  Date Value Range Status  09/19/2012 30.7* 36.0 - 46.0 % Final   Ms Hacker was admitted in early/active labor and progressed to SVD. She had had two visits at the [facility] with many no-show appointments. She had a SW visit while here and there were no barriers to her being discharged with the baby. UDS negatiave.  Physical Exam:  General: alert, cooperative and no distress Lochia: appropriate Uterine Fundus: firm DVT Evaluation: No evidence of DVT seen on physical exam.  Discharge Diagnoses: Term Pregnancy-delivered  Discharge Information: Date: 09/21/2012 Activity: pelvic rest Diet: routine Medications: PNV, Ibuprofen and Labetalol 100mg  twice daily Condition: stable Instructions: refer to practice specific booklet Discharge to: home Follow-up Information   Follow up with [facility]. (Make sure you have a postpartum visit in 6 weeks)    Contact information:   [address] [place] [address] [phone]      Newborn Data: Live born female  Birth Weight: 5 lb 11.4 oz (2590 g) APGAR: 8, 9  Home with mother. Breastfeeding going well. Considering Nexplanon for contraception.   Cam Hai 09/21/2012, 9:35 AM

## 2012-09-21 NOTE — MAU Provider Note (Signed)
Attestation of Attending Supervision of Advanced Practitioner (CNM/NP): Evaluation and management procedures were performed by the Advanced Practitioner under my supervision and collaboration.  I have reviewed the Advanced Practitioner's note and chart, and I agree with the management and plan.  Mirakle Tomlin 09/21/2012 12:58 PM   

## 2012-09-26 ENCOUNTER — Ambulatory Visit (HOSPITAL_COMMUNITY): Payer: Medicaid Other

## 2012-09-27 NOTE — Discharge Summary (Signed)
Attestation of Attending Supervision of Advanced Practitioner: Evaluation and management procedures were performed by the PA/NP/CNM/OB Fellow under my supervision/collaboration. Chart reviewed and agree with management and plan.  Nohea Kras V 09/27/2012 12:27 PM

## 2012-10-19 ENCOUNTER — Ambulatory Visit: Payer: Self-pay | Admitting: Obstetrics and Gynecology

## 2013-01-03 ENCOUNTER — Encounter: Payer: Self-pay | Admitting: *Deleted

## 2014-03-04 ENCOUNTER — Encounter (HOSPITAL_COMMUNITY): Payer: Self-pay | Admitting: *Deleted

## 2014-07-30 ENCOUNTER — Emergency Department (INDEPENDENT_AMBULATORY_CARE_PROVIDER_SITE_OTHER)
Admission: EM | Admit: 2014-07-30 | Discharge: 2014-07-30 | Disposition: A | Discharge status: Home / Self Care | Payer: Self-pay | Source: Home / Self Care | Attending: Family Medicine | Admitting: Family Medicine

## 2014-07-30 ENCOUNTER — Encounter (HOSPITAL_COMMUNITY): Payer: Self-pay | Admitting: *Deleted

## 2014-07-30 DIAGNOSIS — N390 Urinary tract infection, site not specified: Secondary | ICD-10-CM

## 2014-07-30 LAB — POCT PREGNANCY, URINE
PREG TEST UR: NEGATIVE
Preg Test, Ur: NEGATIVE

## 2014-07-30 MED ORDER — CEPHALEXIN 500 MG PO CAPS: 500.0000 mg | ORAL_CAPSULE | Freq: Four times a day (QID) | ORAL | Status: DC

## 2014-07-30 NOTE — Discharge Instructions (Signed)
Take all of medicine as directed, drink lots of fluids, see your doctor if further problems. °

## 2014-07-30 NOTE — ED Provider Notes (Signed)
CSN: [id number]     Arrival date & time 07/30/14  [id number] History   First MD Initiated Contact with Patient 07/30/14 1039     Chief Complaint  Patient presents with  . Hematuria   (Consider location/radiation/quality/duration/timing/severity/associated sxs/prior Treatment) Patient is a 24 y.o. female presenting with hematuria. The history is provided by the patient.  Hematuria This is a new problem. The current episode started 2 days ago. The problem has not changed since onset.Pertinent negatives include no abdominal pain.    Past Medical History  Diagnosis Date  . Hypertension     chronic, diagnosed at age 24, takes Labetalol   History reviewed. No pertinent past surgical history. Family History  Problem Relation Age of Onset  . Hypertension Mother   . Hypertension Brother   . Hypertension Maternal Aunt   . Hypertension Maternal Grandmother   . Diabetes Paternal Grandmother    History  Substance Use Topics  . Smoking status: Never Smoker   . Smokeless tobacco: Never Used  . Alcohol Use: No   OB History    Gravida Para Term Preterm AB TAB SAB Ectopic Multiple Living   2 2 2  0 0 0 0 0 0 2     Review of Systems  Constitutional: Positive for fever and chills.  Gastrointestinal: Negative.  Negative for abdominal pain.  Genitourinary: Positive for dysuria, urgency, frequency and hematuria.    Allergies  Review of patient's allergies indicates no known allergies.  Home Medications   Prior to Admission medications   Medication Sig Start Date End Date Taking? Authorizing Provider  cephALEXin (KEFLEX) 500 MG capsule Take 1 capsule (500 mg total) by mouth 4 (four) times daily. Take all of medicine and drink lots of fluids 07/30/14   Linna HoffJames D Genell Thede, MD  ibuprofen (ADVIL,MOTRIN) 600 MG tablet Take 1 tablet (600 mg total) by mouth every 6 (six) hours. 09/21/12   Arabella MerlesKimberly D Shaw, CNM  labetalol (NORMODYNE) 100 MG tablet Take 1 tablet (100 mg total) by mouth 2 (two) times daily.  07/01/12   Catalina AntiguaPeggy Constant, MD  Prenatal Vit-Fe Fumarate-FA (PRENATAL MULTIVITAMIN) TABS Take 1 tablet by mouth daily at 12 noon.    Historical Provider, MD   BP 152/96 mmHg  Pulse 85  Temp(Src) 97.8 F (36.6 C) (Oral)  Resp 16  SpO2 96%  LMP 07/02/2014 Physical Exam  Constitutional: She is oriented to person, place, and time. She appears well-developed and well-nourished.  Neck: Normal range of motion. Neck supple.  Abdominal: Soft. Normal appearance and bowel sounds are normal. There is tenderness in the suprapubic area. There is no rigidity, no guarding and no CVA tenderness.  Lymphadenopathy:    She has no cervical adenopathy.  Neurological: She is alert and oriented to person, place, and time.  Skin: Skin is warm and dry.  Nursing note and vitals reviewed.   ED Course  Procedures (including critical care time) Labs Review Labs Reviewed  POCT PREGNANCY, URINE  POCT PREGNANCY, URINE    Imaging Review No results found.   MDM   1. UTI (lower urinary tract infection)       Linna HoffJames D Kesleigh Morson, MD 07/30/14 [phone]

## 2014-07-30 NOTE — ED Notes (Signed)
Pt  Reports   Several  Days  Ago     She  Noticed          Some  Back   Pain         With  Pressure   When  She  Urinated       Pt  Also  Reports  Some  Hematuria     As   Well          -  She  Is  Sitting upright  On the  Exam table  Speaking in  Complete  sentances  And  Appearing in no  Severe  Distress

## 2014-08-15 ENCOUNTER — Emergency Department (INDEPENDENT_AMBULATORY_CARE_PROVIDER_SITE_OTHER)
Admission: EM | Admit: 2014-08-15 | Discharge: 2014-08-15 | Disposition: A | Discharge status: Home / Self Care | Payer: Self-pay | Source: Home / Self Care | Attending: Family Medicine | Admitting: Family Medicine

## 2014-08-15 ENCOUNTER — Encounter (HOSPITAL_COMMUNITY): Payer: Self-pay | Admitting: *Deleted

## 2014-08-15 ENCOUNTER — Inpatient Hospital Stay (HOSPITAL_COMMUNITY)
Admission: AD | Admit: 2014-08-15 | Discharge: 2014-08-15 | Disposition: A | Discharge status: Home / Self Care | Payer: Self-pay | Source: Ambulatory Visit | Attending: Obstetrics & Gynecology | Admitting: Obstetrics & Gynecology

## 2014-08-15 DIAGNOSIS — M545 Low back pain, unspecified: Secondary | ICD-10-CM

## 2014-08-15 DIAGNOSIS — I10 Essential (primary) hypertension: Secondary | ICD-10-CM | POA: Insufficient documentation

## 2014-08-15 HISTORY — DX: Sickle-cell trait: D57.3

## 2014-08-15 HISTORY — DX: Anemia, unspecified: D64.9

## 2014-08-15 LAB — CBC
HCT: 34.9 % — ABNORMAL LOW (ref 36.0–46.0)
Hemoglobin: 11.2 g/dL — ABNORMAL LOW (ref 12.0–15.0)
MCH: 23.1 pg — ABNORMAL LOW (ref 26.0–34.0)
MCHC: 32.1 g/dL (ref 30.0–36.0)
MCV: 72 fL — ABNORMAL LOW (ref 78.0–100.0)
PLATELETS: 436 10*3/uL — AB (ref 150–400)
RBC: 4.85 MIL/uL (ref 3.87–5.11)
RDW: 17.8 % — ABNORMAL HIGH (ref 11.5–15.5)
WBC: 10.1 10*3/uL (ref 4.0–10.5)

## 2014-08-15 LAB — POCT URINALYSIS DIP (DEVICE)
BILIRUBIN URINE: NEGATIVE
Glucose, UA: NEGATIVE mg/dL
HGB URINE DIPSTICK: NEGATIVE
Ketones, ur: NEGATIVE mg/dL
Nitrite: NEGATIVE
PH: 6 (ref 5.0–8.0)
PROTEIN: NEGATIVE mg/dL
Specific Gravity, Urine: 1.025 (ref 1.005–1.030)
UROBILINOGEN UA: 0.2 mg/dL (ref 0.0–1.0)

## 2014-08-15 LAB — WET PREP, GENITAL
TRICH WET PREP: NONE SEEN
Yeast Wet Prep HPF POC: NONE SEEN

## 2014-08-15 LAB — POCT PREGNANCY, URINE: Preg Test, Ur: NEGATIVE

## 2014-08-15 MED ORDER — IBUPROFEN 800 MG PO TABS
800.0000 mg | ORAL_TABLET | Freq: Once | ORAL | Status: AC
  Administered 2014-08-15: 800 mg via ORAL
  Filled 2014-08-15: qty 1

## 2014-08-15 MED ORDER — KETOROLAC TROMETHAMINE 60 MG/2ML IM SOLN: 60.0000 mg | Freq: Once | INTRAMUSCULAR | Status: DC

## 2014-08-15 MED ORDER — IBUPROFEN 800 MG PO TABS: 800.0000 mg | ORAL_TABLET | Freq: Three times a day (TID) | ORAL | Status: DC | PRN

## 2014-08-15 NOTE — MAU Provider Note (Signed)
History     CSN: [id number]  Arrival date and time: 08/15/14 2050   First Provider Initiated Contact with Patient 08/15/14 2214      No chief complaint on file.  HPI Comments: Becky Torres is a 24 y.o. [id number] who presents today with lower back pain. She was seen at [facility] earlier today, and was told that she needed to come here in order to have a pelvic exam. She states that she tried Tylenol, but it did not help. She was treated for a UTI a couple of weeks ago, and took all the abx. She states that her LMP was 08/04/14.   Back Pain This is a new problem. The current episode started today. The problem occurs constantly. The problem has been gradually improving since onset. The pain is present in the lumbar spine. The quality of the pain is described as cramping. The pain does not radiate. The pain is at a severity of 7/10. She has tried analgesics for the symptoms. The treatment provided no relief.    Past Medical History  Diagnosis Date  . Hypertension     chronic, diagnosed at age 24, takes Labetalol  . Sickle cell trait   . Anemia     History reviewed. No pertinent past surgical history.  Family History  Problem Relation Age of Onset  . Hypertension Mother   . Hypertension Brother   . Hypertension Maternal Aunt   . Hypertension Maternal Grandmother   . Diabetes Paternal Grandmother     History  Substance Use Topics  . Smoking status: Never Smoker   . Smokeless tobacco: Never Used  . Alcohol Use: No    Allergies: No Known Allergies  Prescriptions prior to admission  Medication Sig Dispense Refill Last Dose  . acetaminophen (TYLENOL) 500 MG tablet Take 1,000 mg by mouth every 6 (six) hours as needed for mild pain.   08/15/2014 at Unknown time  . labetalol (NORMODYNE) 100 MG tablet Take 1 tablet (100 mg total) by mouth 2 (two) times daily. 60 tablet 3 08/14/2014 at 2100  . cephALEXin (KEFLEX) 500 MG capsule Take 1 capsule (500 mg total) by mouth 4 (four) times daily.  Take all of medicine and drink lots of fluids (Patient not taking: Reported on 08/15/2014) 20 capsule 0 Completed Course at Unknown time  . ibuprofen (ADVIL,MOTRIN) 600 MG tablet Take 1 tablet (600 mg total) by mouth every 6 (six) hours. (Patient not taking: Reported on 08/15/2014) 50 tablet 0 Not Taking at Unknown time    Review of Systems  Musculoskeletal: Positive for back pain.   Physical Exam   Blood pressure 163/103, pulse 72, temperature 98.5 F (36.9 C), temperature source Oral, resp. rate 20, last menstrual period 08/04/2014, not currently breastfeeding.  Physical Exam  Nursing note and vitals reviewed. Constitutional: She is oriented to person, place, and time. She appears well-developed and well-nourished. No distress.  Cardiovascular: Normal rate.   Respiratory: Effort normal.  GI: Soft. There is no tenderness. There is no rebound.  Genitourinary:   External: no lesion Vagina: small amount of white discharge Cervix: pink, smooth, no CMT. Large amount of egg white cervical mucous  Uterus: NSSC Adnexa: NT   Neurological: She is alert and oriented to person, place, and time.  Skin: Skin is warm and dry.  Psychiatric: She has a normal mood and affect.   Results for orders placed or performed during the hospital encounter of 08/15/14 (from the past 24 hour(s))  CBC  Status: Abnormal   Collection Time: 08/15/14  9:26 PM  Result Value Ref Range   WBC 10.1 4.0 - 10.5 K/uL   RBC 4.85 3.87 - 5.11 MIL/uL   Hemoglobin 11.2 (L) 12.0 - 15.0 g/dL   HCT 01.0 (L) 27.2 - 53.6 %   MCV 72.0 (L) 78.0 - 100.0 fL   MCH 23.1 (L) 26.0 - 34.0 pg   MCHC 32.1 30.0 - 36.0 g/dL   RDW 64.4 (H) 03.4 - 74.2 %   Platelets 436 (H) 150 - 400 K/uL  Wet prep, genital     Status: Abnormal   Collection Time: 08/15/14 10:00 PM  Result Value Ref Range   Yeast Wet Prep HPF POC NONE SEEN NONE SEEN   Trich, Wet Prep NONE SEEN NONE SEEN   Clue Cells Wet Prep HPF POC FEW (A) NONE SEEN   WBC, Wet Prep  HPF POC FEW (A) NONE SEEN    MAU Course  Procedures  MDM 800 mg ibuprofen  Pain has improved with ibuprofen   Assessment and Plan   1. Midline low back pain without sciatica    DC home RX ibuprofen 800 mg PRN Return to MAU as needed  Follow-up Information    Follow up with [facility] [place].   Why:  As needed   Contact information:   [address] [facility] [address] [phone]      Tawnya Crook 08/15/2014, 10:41 PM

## 2014-08-15 NOTE — ED Provider Notes (Signed)
CSN: [id number]     Arrival date & time 08/15/14  1829 History   First MD Initiated Contact with Patient 08/15/14 1942     Chief Complaint  Patient presents with  . Back Pain   (Consider location/radiation/quality/duration/timing/severity/associated sxs/prior Treatment) Patient is a 24 y.o. female presenting with back pain. The history is provided by the patient.  Back Pain Location:  Lumbar spine Quality:  Cramping Radiates to:  Does not radiate Pain severity:  Moderate Onset quality:  Gradual Duration:  2 weeks Chronicity:  Recurrent Context comment:  Pt seen 2 wks ago with back and hematuria sx, felt possibly to be uti, given keflex, sx improved briefly but returned this am, lmp earlier this month,nl. but heavier. Relieved by:  None tried Worsened by:  Nothing tried Associated symptoms: no pelvic pain     Past Medical History  Diagnosis Date  . Hypertension     chronic, diagnosed at age 19, takes Labetalol   History reviewed. No pertinent past surgical history. Family History  Problem Relation Age of Onset  . Hypertension Mother   . Hypertension Brother   . Hypertension Maternal Aunt   . Hypertension Maternal Grandmother   . Diabetes Paternal Grandmother    History  Substance Use Topics  . Smoking status: Never Smoker   . Smokeless tobacco: Never Used  . Alcohol Use: No   OB History    Gravida Para Term Preterm AB TAB SAB Ectopic Multiple Living   0 0 0 0 0 0 2     Review of Systems  Constitutional: Negative.   Gastrointestinal: Positive for nausea. Negative for vomiting, diarrhea and constipation.  Genitourinary: Negative for hematuria, menstrual problem and pelvic pain.  Musculoskeletal: Positive for back pain.  Skin: Negative.     Allergies  Review of patient's allergies indicates no known allergies.  Home Medications   Prior to Admission medications   Medication Sig Start Date End Date Taking? Authorizing Provider  labetalol (NORMODYNE) 100  MG tablet Take 1 tablet (100 mg total) by mouth 2 (two) times daily. 07/01/12  Yes Peggy Constant, MD  cephALEXin (KEFLEX) 500 MG capsule Take 1 capsule (500 mg total) by mouth 4 (four) times daily. Take all of medicine and drink lots of fluids 07/30/14   Linna Hoff, MD  ibuprofen (ADVIL,MOTRIN) 600 MG tablet Take 1 tablet (600 mg total) by mouth every 6 (six) hours. 09/21/12   Arabella Merles, CNM  Prenatal Vit-Fe Fumarate-FA (PRENATAL MULTIVITAMIN) TABS Take 1 tablet by mouth daily at 12 noon.    Historical Provider, MD   BP 158/104 mmHg  Pulse 98  Temp(Src) 99.2 F (37.3 C) (Oral)  Resp 16  SpO2 100%  LMP 08/02/2014  Breastfeeding? No Physical Exam  Constitutional: She is oriented to person, place, and time. She appears well-developed and well-nourished. No distress.  Abdominal: Soft. Bowel sounds are normal. She exhibits no distension and no mass. There is no tenderness. There is no rebound and no guarding.  Neurological: She is alert and oriented to person, place, and time.  Skin: Skin is warm and dry.  Nursing note and vitals reviewed.   ED Course  Procedures (including critical care time) Labs Review Labs Reviewed  POCT URINALYSIS DIP (DEVICE) - Abnormal; Notable for the following:    Leukocytes, UA TRACE (*)    All other components within normal limits  POCT PREGNANCY, URINE   U/a neg, upreg  Neg. Imaging Review No results found.   MDM  1. Bilateral low back pain without sciatica        Linna HoffJames D Kindl, MD 08/15/14 2032

## 2014-08-15 NOTE — MAU Note (Addendum)
PT  SAYS SHE WAS AT [facility]- FOR  LOWER ABD  PAIN  WITH WHITE D/C.-  TOLD  BY  DR Fayrene FearingJAMES KINDL  TO COME  HERE BECAUSE   THEY DON'T HAVE  THE EQUIPMENT TO DO A PELVIC EXAM-        PT SAYS SHE STARTED  HAVING  LOWER ABD  PAIN  AT 1000- WITH WHITE  D/C -  TOOK  2 TABS OF XS TYLENOL    LAST SEX - DEC 2015.     NO  BIRTH CONTROL.     SAYS HER  BACK HURTS TOO-    STARTED AT 2 PM     THOUGHT  IT WAS HER CHAIR - SHE WORKS  AT [employer].

## 2014-08-15 NOTE — Discharge Instructions (Signed)

## 2014-08-15 NOTE — Discharge Instructions (Signed)
[facility] or ct scan evaluation to further try to explain your recurrent back pain issues.

## 2014-08-15 NOTE — ED Notes (Signed)
C/o sharp pain in lower abdomen.  She took 2 extra strength Tylenol @ 1000 while at work.  Pain moved into her lower back.  Pain worse when driving home from work.  Had bladder infection 2 weeks ago- finished medicine and got better.

## 2014-08-16 LAB — GC/CHLAMYDIA PROBE AMP (~~LOC~~) NOT AT ARMC
Chlamydia: NEGATIVE
Neisseria Gonorrhea: NEGATIVE

## 2014-08-16 LAB — HIV ANTIBODY (ROUTINE TESTING W REFLEX): HIV SCREEN 4TH GENERATION: NONREACTIVE

## 2014-10-08 ENCOUNTER — Encounter (HOSPITAL_COMMUNITY): Payer: Self-pay | Admitting: *Deleted

## 2014-10-08 ENCOUNTER — Inpatient Hospital Stay (HOSPITAL_COMMUNITY)
Admission: AD | Admit: 2014-10-08 | Discharge: 2014-10-09 | Disposition: A | Discharge status: Home / Self Care | Payer: Medicaid Other | Source: Ambulatory Visit | Attending: Emergency Medicine | Admitting: Emergency Medicine

## 2014-10-08 DIAGNOSIS — N898 Other specified noninflammatory disorders of vagina: Secondary | ICD-10-CM | POA: Insufficient documentation

## 2014-10-08 DIAGNOSIS — Z3202 Encounter for pregnancy test, result negative: Secondary | ICD-10-CM | POA: Insufficient documentation

## 2014-10-08 DIAGNOSIS — Z79899 Other long term (current) drug therapy: Secondary | ICD-10-CM | POA: Insufficient documentation

## 2014-10-08 DIAGNOSIS — I10 Essential (primary) hypertension: Secondary | ICD-10-CM | POA: Insufficient documentation

## 2014-10-08 DIAGNOSIS — Z862 Personal history of diseases of the blood and blood-forming organs and certain disorders involving the immune mechanism: Secondary | ICD-10-CM | POA: Insufficient documentation

## 2014-10-08 LAB — POCT PREGNANCY, URINE: Preg Test, Ur: NEGATIVE

## 2014-10-08 LAB — WET PREP, GENITAL
TRICH WET PREP: NONE SEEN
Yeast Wet Prep HPF POC: NONE SEEN

## 2014-10-08 NOTE — MAU Note (Signed)
PT SAYS SHE  HAS  WHITE  D/C  -  STARTED  ON Sunday-    WHILE.   NO GYN  DR.      Micah FlesherWENT  TO       [facility]  -   FOR  BABIES.    HAS CRAMPING-   STARTED  LAST NIGHT.   NO BIRTH  CONTROL-  LAST SEX-    FEB

## 2014-10-08 NOTE — MAU Provider Note (Signed)
History     CSN: [id number]  Arrival date and time: 10/08/14 2212   First Provider Initiated Contact with Patient 10/08/14 2326      No chief complaint on file.  HPI  [name] is a 24 y.o. [id number] who presents to MAU today with complaint of lower abdominal pain and vaginal discharge since last night. The patient states creamy white discharge, without odor. She denies pain now. She took Ibuprofen last night which helped. She denies vaginal bleeding, N/V/D or constipation, UTI symptoms or fever. Patient has a history of HTN and is on Labetalol x 4 years. She states mild headache today. She denies blurred vision, floaters, chest pain or sensory changes.   OB History    Gravida Para Term Preterm AB TAB SAB Ectopic Multiple Living   2 2 2  0 0 0 0 0 0 2      Past Medical History  Diagnosis Date  . Hypertension     chronic, diagnosed at age 24, takes Labetalol  . Sickle cell trait   . Anemia     History reviewed. No pertinent past surgical history.  Family History  Problem Relation Age of Onset  . Hypertension Mother   . Hypertension Brother   . Hypertension Maternal Aunt   . Hypertension Maternal Grandmother   . Diabetes Paternal Grandmother     History  Substance Use Topics  . Smoking status: Never Smoker   . Smokeless tobacco: Never Used  . Alcohol Use: No    Allergies: No Known Allergies  Prescriptions prior to admission  Medication Sig Dispense Refill Last Dose  . ibuprofen (ADVIL,MOTRIN) 800 MG tablet Take 1 tablet (800 mg total) by mouth every 8 (eight) hours as needed. 30 tablet 0 10/07/2014 at Unknown time  . labetalol (NORMODYNE) 100 MG tablet Take 1 tablet (100 mg total) by mouth 2 (two) times daily. 60 tablet 3 10/08/2014 at Unknown time  . acetaminophen (TYLENOL) 500 MG tablet Take 1,000 mg by mouth every 6 (six) hours as needed for mild pain.   More than a month at Unknown time    Review of Systems  Constitutional: Negative for fever and  malaise/fatigue.  Gastrointestinal: Positive for abdominal pain. Negative for nausea, vomiting, diarrhea and constipation.  Genitourinary: Negative for dysuria, urgency and frequency.       + vaginal discharge Neg - vaginal bleeding   Physical Exam   Blood pressure 161/114, pulse 89, temperature 98.1 F (36.7 C), temperature source Oral, resp. rate 20, height 5\' 3"  (1.6 m), weight 169 lb 2 oz (76.715 kg), last menstrual period 09/24/2014.  Physical Exam  Nursing note and vitals reviewed. Constitutional: She is oriented to person, place, and time. She appears well-developed and well-nourished. No distress.  HENT:  Head: Normocephalic and atraumatic.  Cardiovascular: Normal rate.   Respiratory: Effort normal.  GI: Soft. She exhibits no distension and no mass. There is no tenderness. There is no rebound and no guarding.  Genitourinary: Uterus is not enlarged and not tender. Cervix exhibits no motion tenderness, no discharge and no friability. Right adnexum displays no mass and no tenderness. Left adnexum displays no mass and no tenderness. No bleeding in the vagina. Vaginal discharge (small amount of thin, white discharge noted) found.  Neurological: She is alert and oriented to person, place, and time.  Skin: Skin is warm and dry. No erythema.  Psychiatric: She has a normal mood and affect.   Results for orders placed or performed during the hospital  encounter of 10/08/14 (from the past 24 hour(s))  Pregnancy, urine POC     Status: None   Collection Time: 10/08/14 11:16 PM  Result Value Ref Range   Preg Test, Ur NEGATIVE NEGATIVE   Patient Vitals for the past 24 hrs:  BP Temp Temp src Pulse Resp Height Weight  10/08/14 2332 (!) 161/114 mmHg - - 89 - - -  10/08/14 2233 (!) 154/111 mmHg 98.1 F (36.7 C) Oral 76 20  (1.6 m) 169 lb 2 oz (76.715 kg)    MAU Course  Procedures None  MDM UPT - negative Wet prep, GC/Chlamydia, HIV and RPR today Discussed patient and BP with  [facility]. Dr. Mora Bellman will accept transfer for further evaluation and management  Assessment and Plan  A: HTN, uncontrolled Lower abdominal pain Vaginal discharge  P: Transfer to [facility] for further evaluation    Marny Lowenstein, PA-C  10/08/2014, 11:43 PM

## 2014-10-09 LAB — URINE MICROSCOPIC-ADD ON

## 2014-10-09 LAB — URINALYSIS, ROUTINE W REFLEX MICROSCOPIC
BILIRUBIN URINE: NEGATIVE
GLUCOSE, UA: NEGATIVE mg/dL
KETONES UR: NEGATIVE mg/dL
Nitrite: NEGATIVE
Protein, ur: NEGATIVE mg/dL
SPECIFIC GRAVITY, URINE: 1.02 (ref 1.005–1.030)
UROBILINOGEN UA: 0.2 mg/dL (ref 0.0–1.0)
pH: 6 (ref 5.0–8.0)

## 2014-10-09 LAB — RPR: RPR: NONREACTIVE

## 2014-10-09 LAB — GC/CHLAMYDIA PROBE AMP (~~LOC~~) NOT AT ARMC
CHLAMYDIA, DNA PROBE: NEGATIVE
NEISSERIA GONORRHEA: NEGATIVE

## 2014-10-09 LAB — HIV ANTIBODY (ROUTINE TESTING W REFLEX): HIV Screen 4th Generation wRfx: NONREACTIVE

## 2014-10-09 NOTE — ED Notes (Signed)
Bed: NW29WA15 Expected date:  Expected time:  Means of arrival:  Comments: Hold for women's transfer

## 2014-10-09 NOTE — Discharge Instructions (Signed)
Hypertension Hypertension is another name for high blood pressure. High blood pressure forces your heart to work harder to pump blood. A blood pressure reading has two numbers, which includes a higher number over a lower number (example: 110/72). HOME CARE   Have your blood pressure rechecked by your doctor.  Only take medicine as told by your doctor. Follow the directions carefully. The medicine does not work as well if you skip doses. Skipping doses also puts you at risk for problems.  Do not smoke.  Monitor your blood pressure at home as told by your doctor. GET HELP IF:  You think you are having a reaction to the medicine you are taking.  You have repeat headaches or feel dizzy.  You have puffiness (swelling) in your ankles.  You have trouble with your vision. GET HELP RIGHT AWAY IF:   You get a very bad headache and are confused.  You feel weak, numb, or faint.  You get chest or belly (abdominal) pain.  You throw up (vomit).  You cannot breathe very well. MAKE SURE YOU:   Understand these instructions.  Will watch your condition.  Will get help right away if you are not doing well or get worse. Document Released: 10/06/2007 Document Revised: 04/24/2013 Document Reviewed: 02/09/2013 Exit[employer] Patient Information 2015 [employer]. This information is not intended to replace advice given to you by your health care provider. Make sure you discuss any questions you have with your health care provider.  

## 2014-10-09 NOTE — ED Provider Notes (Signed)
CSN: [id number]     Arrival date & time 10/08/14  2212 History   None    No chief complaint on file.    (Consider location/radiation/quality/duration/timing/severity/associated sxs/prior Treatment) HPI [name] is a 24 year old female [facility] hypertension. Patient states his high blood since she was 24 years old. She is placed on labetalol during her pregnancy and this was continued. She does not have a primary care physician and her blood pressures were managed by her OB/GYN. She denies any symptoms of hypertensive emergency. She states she had a mild headache 2 weeks ago as his symptoms resolved. She denies neurological symptoms. Patient has no other complaints.  10 Systems reviewed and are negative for acute change except as noted in the HPI.    Past Medical History  Diagnosis Date  . Hypertension     chronic, diagnosed at age 24, takes Labetalol  . Sickle cell trait   . Anemia    History reviewed. No pertinent past surgical history. Family History  Problem Relation Age of Onset  . Hypertension Mother   . Hypertension Brother   . Hypertension Maternal Aunt   . Hypertension Maternal Grandmother   . Diabetes Paternal Grandmother    History  Substance Use Topics  . Smoking status: Never Smoker   . Smokeless tobacco: Never Used  . Alcohol Use: No   OB History    Gravida Para Term Preterm AB TAB SAB Ectopic Multiple Living   2 2 2  0 0 0 0 0 0 2     Review of Systems    Allergies  Review of patient's allergies indicates no known allergies.  Home Medications   Prior to Admission medications   Medication Sig Start Date End Date Taking? Authorizing Provider  ibuprofen (ADVIL,MOTRIN) 800 MG tablet Take 1 tablet (800 mg total) by mouth every 8 (eight) hours as needed. 08/15/14  Yes Armando ReichertHeather D Hogan, CNM  labetalol (NORMODYNE) 100 MG tablet Take 1 tablet (100 mg total) by mouth 2 (two) times daily. 07/01/12  Yes Peggy Constant, MD   acetaminophen (TYLENOL) 500 MG tablet Take 1,000 mg by mouth every 6 (six) hours as needed for mild pain.    Historical Provider, MD   BP 161/114 mmHg  Pulse 89  Temp(Src) 98.1 F (36.7 C) (Oral)  Resp 20  Ht 5\' 3"  (1.6 m)  Wt 169 lb 2 oz (76.715 kg)  BMI 29.97 kg/m2  LMP 09/24/2014 Physical Exam  Constitutional: She is oriented to person, place, and time. She appears well-developed and well-nourished. No distress.  HENT:  Head: Normocephalic and atraumatic.  Nose: Nose normal.  Mouth/Throat: Oropharynx is clear and moist. No oropharyngeal exudate.  Eyes: Conjunctivae and EOM are normal. Pupils are equal, round, and reactive to light. No scleral icterus.  Neck: Normal range of motion. Neck supple. No JVD present. No tracheal deviation present. No thyromegaly present.  Cardiovascular: Normal rate, regular rhythm and normal heart sounds.  Exam reveals no gallop and no friction rub.   No murmur heard. Pulmonary/Chest: Effort normal and breath sounds normal. No respiratory distress. She has no wheezes. She exhibits no tenderness.  Abdominal: Soft. Bowel sounds are normal. She exhibits no distension and no mass. There is no tenderness. There is no rebound and no guarding.  Musculoskeletal: Normal range of motion. She exhibits no edema or tenderness.  Lymphadenopathy:    She has no cervical adenopathy.  Neurological: She is alert and oriented to person, place, and time. No  cranial nerve deficit. She exhibits normal muscle tone.  Skin: Skin is warm and dry. No rash noted. She is not diaphoretic. No erythema. No pallor.  Nursing note and vitals reviewed.   ED Course  Procedures (including critical care time) Labs Review Labs Reviewed  WET PREP, GENITAL - Abnormal; Notable for the following:    Clue Cells Wet Prep HPF POC FEW (*)    WBC, Wet Prep HPF POC FEW (*)    All other components within normal limits  URINALYSIS, ROUTINE W REFLEX MICROSCOPIC (NOT AT [facility]) - Abnormal; Notable for  the following:    Hgb urine dipstick TRACE (*)    Leukocytes, UA TRACE (*)    All other components within normal limits  URINE MICROSCOPIC-ADD ON  RPR  HIV ANTIBODY (ROUTINE TESTING)  POCT PREGNANCY, URINE  GC/CHLAMYDIA PROBE AMP ([place]) NOT AT [facility]    Imaging Review No results found.   EKG Interpretation None      MDM   Final diagnoses:  Essential hypertension, benign   Patient presents to the ED for evaluation for HTN without any symptoms. Labetalol is not first line medication for HTN.  She was started on amlodipine in the ED and Rx 30 day course.  She was advised to fu with a PCP for continued managment of HTN.  She is currently asymptomatic and in NAD.  Her neurological exam is normal. Her VS remain within her normal limits, although hypertensive, and she is safe for DC with fu within 3 days.    Tomasita Crumble, MD 10/09/14 0200

## 2014-10-09 NOTE — ED Notes (Signed)
See downtime charting. 

## 2014-10-10 ENCOUNTER — Telehealth: Payer: Self-pay | Admitting: Advanced Practice Midwife

## 2014-10-10 DIAGNOSIS — B9689 Other specified bacterial agents as the cause of diseases classified elsewhere: Secondary | ICD-10-CM

## 2014-10-10 DIAGNOSIS — N760 Acute vaginitis: Principal | ICD-10-CM

## 2014-10-10 MED ORDER — METRONIDAZOLE 500 MG PO TABS: 500.0000 mg | ORAL_TABLET | Freq: Two times a day (BID) | ORAL | Status: DC

## 2014-10-10 NOTE — Telephone Encounter (Signed)
Seen in MAU 10/08/14 for vaginal discharge and found to have uncontrolled chronic hypertension. Not treated for BV because she only had few clue cells on wet prep, but patient is complaining that she's having heavy vaginal discharge with odor. Will treat with Flagyl.

## 2015-01-02 ENCOUNTER — Inpatient Hospital Stay (HOSPITAL_COMMUNITY)
Admission: AD | Admit: 2015-01-02 | Discharge: 2015-01-02 | Disposition: A | Discharge status: Home / Self Care | Payer: Self-pay | Source: Ambulatory Visit | Attending: Family Medicine | Admitting: Family Medicine

## 2015-01-02 ENCOUNTER — Encounter (HOSPITAL_COMMUNITY): Payer: Self-pay

## 2015-01-02 DIAGNOSIS — Z3202 Encounter for pregnancy test, result negative: Secondary | ICD-10-CM

## 2015-01-02 DIAGNOSIS — Z202 Contact with and (suspected) exposure to infections with a predominantly sexual mode of transmission: Secondary | ICD-10-CM | POA: Insufficient documentation

## 2015-01-02 DIAGNOSIS — R1084 Generalized abdominal pain: Secondary | ICD-10-CM | POA: Insufficient documentation

## 2015-01-02 LAB — URINALYSIS, ROUTINE W REFLEX MICROSCOPIC
BILIRUBIN URINE: NEGATIVE
Glucose, UA: NEGATIVE mg/dL
Hgb urine dipstick: NEGATIVE
Ketones, ur: NEGATIVE mg/dL
NITRITE: NEGATIVE
PH: 6 (ref 5.0–8.0)
Protein, ur: NEGATIVE mg/dL
Specific Gravity, Urine: 1.015 (ref 1.005–1.030)
UROBILINOGEN UA: 0.2 mg/dL (ref 0.0–1.0)

## 2015-01-02 LAB — URINE MICROSCOPIC-ADD ON

## 2015-01-02 LAB — WET PREP, GENITAL
CLUE CELLS WET PREP: NONE SEEN
Trich, Wet Prep: NONE SEEN
YEAST WET PREP: NONE SEEN

## 2015-01-02 MED ORDER — CEFTRIAXONE SODIUM 250 MG IJ SOLR
250.0000 mg | Freq: Once | INTRAMUSCULAR | Status: AC
  Administered 2015-01-02: 250 mg via INTRAMUSCULAR
  Filled 2015-01-02: qty 250

## 2015-01-02 MED ORDER — AZITHROMYCIN 250 MG PO TABS
1000.0000 mg | ORAL_TABLET | Freq: Once | ORAL | Status: AC
  Administered 2015-01-02: 1000 mg via ORAL
  Filled 2015-01-02: qty 4

## 2015-01-02 NOTE — MAU Provider Note (Signed)
History     CSN: [id number]  Arrival date and time: 01/02/15 2125   First Provider Initiated Contact with Patient 01/02/15 2155      No chief complaint on file.  Exposure to STD  The patient's pertinent negatives include no discharge, dysuria, genital itching, genital lesions, genital rash or pelvic pain. This is a new problem. The current episode started today (got a call from her partner that he had gonorrhea. She states that he told her that he did get treatment. ). The problem has been unchanged. The vaginal discharge was normal. Pertinent negatives include no abdominal pain or fever. She has tried nothing for the symptoms. Risk factors include history of STDs.    Past Medical History  Diagnosis Date  . Hypertension     chronic, diagnosed at age 5, takes Labetalol  . Sickle cell trait   . Anemia     History reviewed. No pertinent past surgical history.  Family History  Problem Relation Age of Onset  . Hypertension Mother   . Hypertension Brother   . Hypertension Maternal Aunt   . Hypertension Maternal Grandmother   . Diabetes Paternal Grandmother     Social History  Substance Use Topics  . Smoking status: Never Smoker   . Smokeless tobacco: Never Used  . Alcohol Use: No    Allergies: No Known Allergies  No prescriptions prior to admission    Review of Systems  Constitutional: Negative for fever.  Gastrointestinal: Negative for abdominal pain.  Genitourinary: Negative for dysuria and pelvic pain.   Physical Exam   Blood pressure 148/91, pulse 75, temperature 98.3 F (36.8 C), temperature source Oral, resp. rate 18, height  (1.6 m), weight 72.576 kg (160 lb), last menstrual period 12/11/2014, SpO2 100 %.  Physical Exam  Nursing note and vitals reviewed. Constitutional: She is oriented to person, place, and time. She appears well-developed and well-nourished. No distress.  HENT:  Head: Normocephalic.  Cardiovascular: Normal rate.   Respiratory:  Effort normal.  GI: Soft. There is no tenderness. There is no rebound.  Neurological: She is alert and oriented to person, place, and time.  Skin: Skin is warm and dry.  Psychiatric: She has a normal mood and affect.   Results for orders placed or performed during the hospital encounter of 01/02/15 (from the past 24 hour(s))  Urinalysis, Routine w reflex microscopic (not at [facility])     Status: Abnormal   Collection Time: 01/02/15  9:35 PM  Result Value Ref Range   Color, Urine YELLOW YELLOW   APPearance CLEAR CLEAR   Specific Gravity, Urine 1.015 1.005 - 1.030   pH 6.0 5.0 - 8.0   Glucose, UA NEGATIVE NEGATIVE mg/dL   Hgb urine dipstick NEGATIVE NEGATIVE   Bilirubin Urine NEGATIVE NEGATIVE   Ketones, ur NEGATIVE NEGATIVE mg/dL   Protein, ur NEGATIVE NEGATIVE mg/dL   Urobilinogen, UA 0.2 0.0 - 1.0 mg/dL   Nitrite NEGATIVE NEGATIVE   Leukocytes, UA TRACE (A) NEGATIVE  Urine microscopic-add on     Status: Abnormal   Collection Time: 01/02/15  9:35 PM  Result Value Ref Range   Squamous Epithelial / LPF FEW (A) RARE   WBC, UA 0-2 <3 WBC/hpf   Bacteria, UA MANY (A) RARE  Wet prep, genital     Status: Abnormal   Collection Time: 01/02/15 10:00 PM  Result Value Ref Range   Yeast Wet Prep HPF POC NONE SEEN NONE SEEN   Trich, Wet Prep NONE SEEN NONE SEEN  Clue Cells Wet Prep HPF POC NONE SEEN NONE SEEN   WBC, Wet Prep HPF POC FEW (A) NONE SEEN     MAU Course  Procedures  MDM Patient declines HIV/RPR today. She states that she had it done in June.  Patient treated here in MAU with 250mg  Rocephin and 1g of Azithromycin  Assessment and Plan   1. Exposure to STD   2. Generalized abdominal pain    DC home GC/CT PENDING RX: none  Return to MAU as needed FU with OB as planned  Follow-up Information    Follow up with [facility].   Specialty:  Obstetrics and Gynecology   Why:  As needed   Contact information:   [address] [place]  [address] [phone]        Becky Torres 01/02/2015, 11:29 PM

## 2015-01-02 NOTE — MAU Note (Signed)
Pt states that she got a call from her sig other today telling her that he had GC. Denies pain.

## 2015-01-02 NOTE — Discharge Instructions (Signed)
Gonorrhea °Gonorrhea is an infection that can cause serious problems. If left untreated, the infection may:  °· Damage the female or female organs.   °· Cause women to be unable to have children (sterility).   °· Harm a fetus if the infected woman is pregnant.   °It is important to get treatment for gonorrhea as soon as possible. It is also necessary that all your sexual partners be tested for the infection.  °CAUSES  °Gonorrhea is caused by bacteria called Neisseria gonorrhoeae. The infection is spread from person to person, usually by sexual contact (such as by anal, vaginal, or oral means). A newborn can contract the infection from his or her mother during birth.  °SYMPTOMS  °Some people with gonorrhea do not have symptoms. Symptoms may be different in females and males.  °Females  °The most common symptoms are:  °· Pain in the lower abdomen.   °· Fever with or without chills.   °Other symptoms include:  °· Abnormal vaginal discharge.   °· Painful intercourse.   °· Burning or itching of the vagina or lips of the vagina.   °· Abnormal vaginal bleeding.   °· Pain when urinating.   °· Long-lasting (chronic) pain in the lower abdomen, especially during menstruation or intercourse.   °· Inability to become pregnant.   °· Going into premature labor.   °· Irritation, pain, bleeding, or discharge from the rectum. This may occur if the infection was spread by anal sex.   °· Sore throat or swollen lymph nodes in the neck. This may occur if the infection was spread by oral sex.   °Males  °The most common symptoms are:  °· Discharge from the penis.   °· Pain or burning during urination.   °· Pain or swelling in the testicles. °Other symptoms may include:  °· Irritation, pain, bleeding, or discharge from the rectum. This may occur if the infection was spread by anal sex.   °· Sore throat, fever, or swollen lymph nodes in the neck. This may occur if the infection was spread by oral sex.   °DIAGNOSIS  °A diagnosis is made after a  physical exam is done and a sample of discharge is examined under a microscope for the presence of the bacteria. The discharge may be taken from the urethra, cervix, throat, or rectum.  °TREATMENT  °Gonorrhea is treated with antibiotic medicines. It is important for treatment to begin as soon as possible. Early treatment may prevent some problems from developing.  °HOME CARE INSTRUCTIONS  °· Take medicines only as directed by your health care provider.   °· Take your antibiotic medicine as directed by your health care provider. Finish the antibiotic even if you start to feel better. Incomplete treatment will put you at risk for continued infection.   °· Do not have sex until treatment is complete or as directed by your health care provider.   °· Keep all follow-up visits as directed by your health care provider.   °· Not all test results are available during your visit. If your test results are not back during the visit, make an appointment with your health care provider to find out the results. Do not assume everything is normal if you have not heard from your health care provider or the medical facility. It is your responsibility to get your test results. °· If you test positive for gonorrhea, inform your recent sexual partners. They need to be checked for gonorrhea even if they do not have symptoms. They may need treatment, even if they test negative for gonorrhea.   °SEEK MEDICAL CARE IF:  °· You develop any bad reaction   to the medicine you were prescribed. This may include:   °¨ A rash.   °¨ Nausea.   °¨ Vomiting.   °¨ Diarrhea.   °· Your symptoms do not improve after a few days of taking antibiotics.   °· Your symptoms get worse.   °· You develop increased pain, such as in the testicles (for males) or in the abdomen (for females). °· You have a fever. °MAKE SURE YOU:  °· Understand these instructions. °· Will watch your condition. °· Will get help right away if you are not doing well or get worse. °Document  Released: 04/16/2000 Document Revised: 09/03/2013 Document Reviewed: 10/25/2012 °ExitCare® Patient Information ©2015 [employer]. This information is not intended to replace advice given to you by your health care provider. Make sure you discuss any questions you have with your health care provider. ° °Chlamydia °Chlamydia is an infection. It is spread through sexual contact. Chlamydia can be in different areas of the body. These areas include the cervix, urethra, throat, or rectum. You may not know you have chlamydia because many people never develop the symptoms. Chlamydia is not difficult to treat once you know you have it. However, if it is left untreated, chlamydia can lead to more serious health problems.  °CAUSES  °Chlamydia is caused by bacteria. It is a sexually transmitted disease. It is passed from an infected partner during intimate contact. This contact could be with the genitals, mouth, or rectal area. Chlamydia can also be passed from mothers to babies during birth. °SIGNS AND SYMPTOMS  °There may not be any symptoms. This is often the case early in the infection. If symptoms develop, they may include: °· Mild pain and discomfort when urinating. °· Redness, soreness, and swelling (inflammation) of the rectum. °· Vaginal discharge. °· Painful intercourse. °· Abdominal pain. °· Bleeding between menstrual periods. °DIAGNOSIS  °To diagnose this infection, your health care provider will do a pelvic exam. Cultures will be taken of the vagina, cervix, urine, and possibly the rectum to verify the diagnosis.  °TREATMENT °You will be given antibiotic medicines. If you are pregnant, certain types of antibiotics will need to be avoided. Any sexual partners should also be treated, even if they do not show symptoms.  °HOME CARE INSTRUCTIONS  °· Take your antibiotic medicine as directed by your health care provider. Finish the antibiotic even if you start to feel better. °· Take medicines only as directed by your  health care provider. °· Inform any sexual partners about the infection. They should also be treated. °· Do not have sexual contact until your health care provider tells you it is okay. °· Get plenty of rest. °· Eat a well-balanced diet. °· Drink enough fluids to keep your urine clear or pale yellow. °· Keep all follow-up visits as directed by your health care provider. °SEEK MEDICAL CARE IF: °· You have painful urination. °· You have abdominal pain. °· You have vaginal discharge. °· You have painful sexual intercourse. °· You have bleeding between periods and after sex. °· You have a fever. °SEEK IMMEDIATE MEDICAL CARE IF:  °· You experience nausea or vomiting. °· You experience excessive sweating (diaphoresis). °· You have difficulty swallowing. °MAKE SURE YOU:  °· Understand these instructions. °· Will watch your condition. °· Will get help right away if you are not doing well or get worse. °Document Released: 01/27/2005 Document Revised: 09/03/2013 Document Reviewed: 12/25/2012 °ExitCare® Patient Information ©2015 [employer]. This information is not intended to replace advice given to you by your health care provider.   Make sure you discuss any questions you have with your health care provider. ° ° °

## 2015-01-03 ENCOUNTER — Telehealth (HOSPITAL_COMMUNITY): Payer: Self-pay

## 2015-01-03 LAB — POCT PREGNANCY, URINE: PREG TEST UR: NEGATIVE

## 2015-01-03 LAB — GC/CHLAMYDIA PROBE AMP (~~LOC~~) NOT AT ARMC
CHLAMYDIA, DNA PROBE: NEGATIVE
NEISSERIA GONORRHEA: POSITIVE — AB

## 2015-10-30 ENCOUNTER — Ambulatory Visit (INDEPENDENT_AMBULATORY_CARE_PROVIDER_SITE_OTHER): Payer: Self-pay | Admitting: *Deleted

## 2015-10-30 ENCOUNTER — Inpatient Hospital Stay (HOSPITAL_COMMUNITY)
Admission: AD | Admit: 2015-10-30 | Discharge: 2015-10-30 | Disposition: A | Discharge status: Home / Self Care | Payer: Medicaid Other | Source: Ambulatory Visit | Attending: Family Medicine | Admitting: Family Medicine

## 2015-10-30 ENCOUNTER — Encounter: Payer: Self-pay | Admitting: *Deleted

## 2015-10-30 VITALS — BP 137/90 | HR 77

## 2015-10-30 DIAGNOSIS — N912 Amenorrhea, unspecified: Secondary | ICD-10-CM | POA: Insufficient documentation

## 2015-10-30 DIAGNOSIS — Z3491 Encounter for supervision of normal pregnancy, unspecified, first trimester: Secondary | ICD-10-CM

## 2015-10-30 DIAGNOSIS — Z3201 Encounter for pregnancy test, result positive: Secondary | ICD-10-CM

## 2015-10-30 LAB — POCT PREGNANCY, URINE: Preg Test, Ur: POSITIVE — AB

## 2015-10-30 MED ORDER — LABETALOL HCL 100 MG PO TABS: 100.0000 mg | ORAL_TABLET | Freq: Two times a day (BID) | ORAL | Status: DC

## 2015-10-30 MED ORDER — PRENATAL VITAMINS 0.8 MG PO TABS: 1.0000 | ORAL_TABLET | Freq: Every day | ORAL | Status: DC

## 2015-10-30 NOTE — MAU Note (Signed)
Pt just want confirmation of pregnancy. No complaints

## 2015-10-30 NOTE — Progress Notes (Signed)
Patient presenting for pregnancy test which is positive. 5621w2d by LMP. She has chronic hypertension and takes medicine but does not know the name of it. Consulted with Dr Jolayne Pantheronstant who ordered she start on labetolol 100mg  bid. Order sent to pharmacy. Patient advised to stop old med and start labetolol, come for initial prenatal visit in 8 weeks. Understanding voiced.

## 2015-10-30 NOTE — Discharge Instructions (Signed)

## 2015-10-30 NOTE — MAU Provider Note (Signed)
Becky Torres is a 25 y.o. [id number] who presents to MAU today for pregnancy verification. The patient denies abdominal pain, vaginal bleeding, fever or N/V/D today.   There were no vitals taken for this visit.  CONSTITUTIONAL: Well-developed, well-nourished female in no acute distress.  CARDIOVASCULAR: Regular heart rate RESPIRATORY: Normal effort NEUROLOGICAL: Alert and oriented to person, place, and time.  SKIN: Skin is warm and dry. No rash noted. Not diaphoretic. No erythema. No pallor. PSYCH: Normal mood and affect. Normal behavior. Normal judgment and thought content.  MDM Medical Screen Exam Complete  A: Amenorrhea  P: Discharge from MAU Patient advised to follow-up with [facility] for pregnancy confirmation  Monday-Thursday 8am-4pm or Friday 8am-11am Patient may return to MAU as needed or if her condition were to change or worsen   Marny LowensteinJulie N Wenzel, PA-C  10/30/2015 3:42 PM

## 2015-11-10 ENCOUNTER — Inpatient Hospital Stay (HOSPITAL_COMMUNITY)
Admission: AD | Admit: 2015-11-10 | Discharge: 2015-11-10 | Disposition: A | Discharge status: Home / Self Care | Payer: Medicaid Other | Source: Ambulatory Visit | Attending: Obstetrics & Gynecology | Admitting: Obstetrics & Gynecology

## 2015-11-10 ENCOUNTER — Encounter (HOSPITAL_COMMUNITY): Payer: Self-pay | Admitting: *Deleted

## 2015-11-10 DIAGNOSIS — R51 Headache: Secondary | ICD-10-CM | POA: Insufficient documentation

## 2015-11-10 DIAGNOSIS — Z3A01 Less than 8 weeks gestation of pregnancy: Secondary | ICD-10-CM | POA: Insufficient documentation

## 2015-11-10 DIAGNOSIS — D573 Sickle-cell trait: Secondary | ICD-10-CM | POA: Insufficient documentation

## 2015-11-10 DIAGNOSIS — O2341 Unspecified infection of urinary tract in pregnancy, first trimester: Secondary | ICD-10-CM | POA: Insufficient documentation

## 2015-11-10 DIAGNOSIS — O219 Vomiting of pregnancy, unspecified: Secondary | ICD-10-CM

## 2015-11-10 DIAGNOSIS — O161 Unspecified maternal hypertension, first trimester: Secondary | ICD-10-CM | POA: Insufficient documentation

## 2015-11-10 DIAGNOSIS — O26891 Other specified pregnancy related conditions, first trimester: Secondary | ICD-10-CM

## 2015-11-10 DIAGNOSIS — R519 Headache, unspecified: Secondary | ICD-10-CM

## 2015-11-10 DIAGNOSIS — Z79899 Other long term (current) drug therapy: Secondary | ICD-10-CM | POA: Insufficient documentation

## 2015-11-10 HISTORY — DX: Chlamydial infection, unspecified: A74.9

## 2015-11-10 LAB — URINALYSIS, ROUTINE W REFLEX MICROSCOPIC
Bilirubin Urine: NEGATIVE
GLUCOSE, UA: NEGATIVE mg/dL
KETONES UR: 15 mg/dL — AB
NITRITE: POSITIVE — AB
PH: 6 (ref 5.0–8.0)
Protein, ur: NEGATIVE mg/dL
SPECIFIC GRAVITY, URINE: 1.02 (ref 1.005–1.030)

## 2015-11-10 LAB — URINE MICROSCOPIC-ADD ON: RBC / HPF: NONE SEEN RBC/hpf (ref 0–5)

## 2015-11-10 MED ORDER — LACTATED RINGERS IV BOLUS (SEPSIS)
1000.0000 mL | Freq: Once | INTRAVENOUS | Status: AC
  Administered 2015-11-10: 1000 mL via INTRAVENOUS

## 2015-11-10 MED ORDER — METOCLOPRAMIDE HCL 10 MG PO TABS
10.0000 mg | ORAL_TABLET | Freq: Once | ORAL | Status: AC
  Administered 2015-11-10: 10 mg via ORAL
  Filled 2015-11-10: qty 1

## 2015-11-10 MED ORDER — PROMETHAZINE HCL 25 MG/ML IJ SOLN
25.0000 mg | Freq: Once | INTRAMUSCULAR | Status: AC
  Administered 2015-11-10: 25 mg via INTRAVENOUS
  Filled 2015-11-10: qty 1

## 2015-11-10 MED ORDER — PROMETHAZINE HCL 12.5 MG PO TABS: 12.5000 mg | ORAL_TABLET | Freq: Four times a day (QID) | ORAL | Status: DC | PRN

## 2015-11-10 MED ORDER — ACETAMINOPHEN 325 MG PO TABS
650.0000 mg | ORAL_TABLET | Freq: Once | ORAL | Status: AC
Start: 2015-11-10 — End: 2015-11-10
  Administered 2015-11-10: 650 mg via ORAL
  Filled 2015-11-10: qty 2

## 2015-11-10 NOTE — Discharge Instructions (Signed)
Morning Sickness °Morning sickness is when you feel sick to your stomach (nauseous) during pregnancy. This nauseous feeling may or may not come with vomiting. It often occurs in the morning but can be a problem any time of day. Morning sickness is most common during the first trimester, but it may continue throughout pregnancy. While morning sickness is unpleasant, it is usually harmless unless you develop severe and continual vomiting (hyperemesis gravidarum). This condition requires more intense treatment.  °CAUSES  °The cause of morning sickness is not completely known but seems to be related to normal hormonal changes that occur in pregnancy. °RISK FACTORS °You are at greater risk if you: °· Experienced nausea or vomiting before your pregnancy. °· Had morning sickness during a previous pregnancy. °· Are pregnant with more than one baby, such as twins. °TREATMENT  °Do not use any medicines (prescription, over-the-counter, or herbal) for morning sickness without first talking to your health care provider. Your health care provider may prescribe or recommend: °· Vitamin B6 supplements. °· Anti-nausea medicines. °· The herbal medicine ginger. °HOME CARE INSTRUCTIONS  °· Only take over-the-counter or prescription medicines as directed by your health care provider. °· Taking multivitamins before getting pregnant can prevent or decrease the severity of morning sickness in most women. °· Eat a piece of dry toast or unsalted crackers before getting out of bed in the morning. °· Eat five or six small meals a day. °· Eat dry and bland foods (rice, baked potato). Foods high in carbohydrates are often helpful. °· Do not drink liquids with your meals. Drink liquids between meals. °· Avoid greasy, fatty, and spicy foods. °· Get someone to cook for you if the smell of any food causes nausea and vomiting. °· If you feel nauseous after taking prenatal vitamins, take the vitamins at night or with a snack.  °· Snack on protein  foods (nuts, yogurt, cheese) between meals if you are hungry. °· Eat unsweetened gelatins for desserts. °· Wearing an acupressure wristband (worn for sea sickness) may be helpful. °· Acupuncture may be helpful. °· Do not smoke. °· Get a humidifier to keep the air in your house free of odors. °· Get plenty of fresh air. °SEEK MEDICAL CARE IF:  °· Your home remedies are not working, and you need medicine. °· You feel dizzy or lightheaded. °· You are losing weight. °SEEK IMMEDIATE MEDICAL CARE IF:  °· You have persistent and uncontrolled nausea and vomiting. °· You pass out (faint). °MAKE SURE YOU: °· Understand these instructions. °· Will watch your condition. °· Will get help right away if you are not doing well or get worse. °  °This information is not intended to replace advice given to you by your health care provider. Make sure you discuss any questions you have with your health care provider. °  °Document Released: 06/10/2006 Document Revised: 04/24/2013 Document Reviewed: 10/04/2012 °Elsevier Interactive Patient Education ©2016 [employer]. ° ° ° °Safe Medications in Pregnancy  ° °Acne: °Benzoyl Peroxide °Salicylic Acid ° °Backache/Headache: °Tylenol: 2 regular strength every 4 hours OR °             2 Extra strength every 6 hours ° °Colds/Coughs/Allergies: °Benadryl (alcohol free) 25 mg every 6 hours as needed °Breath right strips °Claritin °Cepacol throat lozenges °Chloraseptic throat spray °Cold-Eeze- up to three times per day °Cough drops, alcohol free °Flonase (by prescription only) °Guaifenesin °Mucinex °Robitussin DM (plain only, alcohol free) °Saline nasal spray/drops °Sudafed (pseudoephedrine) & Actifed ** use only after [redacted]   weeks gestation and if you do not have high blood pressure °Tylenol °Vicks Vaporub °Zinc lozenges °Zyrtec  ° °Constipation: °Colace °Ducolax suppositories °Fleet enema °Glycerin suppositories °Metamucil °Milk of magnesia °Miralax °Senokot °Smooth move  tea ° °Diarrhea: °Kaopectate °Imodium A-D ° °*NO pepto Bismol ° °Hemorrhoids: °Anusol °Anusol HC °Preparation H °Tucks ° °Indigestion: °Tums °Maalox °Mylanta °Zantac  °Pepcid ° °Insomnia: °Benadryl (alcohol free) 25mg every 6 hours as needed °Tylenol PM °Unisom, no Gelcaps ° °Leg Cramps: °Tums °MagGel ° °Nausea/Vomiting:  °Bonine °Dramamine °Emetrol °Ginger extract °Sea bands °Meclizine  °Nausea medication to take during pregnancy:  °Unisom (doxylamine succinate 25 mg tablets) Take one tablet daily at bedtime. If symptoms are not adequately controlled, the dose can be increased to a maximum recommended dose of two tablets daily (1/2 tablet in the morning, 1/2 tablet mid-afternoon and one at bedtime). °Vitamin B6 100mg tablets. Take one tablet twice a day (up to 200 mg per day). ° °Skin Rashes: °Aveeno products °Benadryl cream or 25mg every 6 hours as needed °Calamine Lotion °1% cortisone cream ° °Yeast infection: °Gyne-lotrimin 7 °Monistat 7 ° °Gum/tooth pain: °Anbesol ° °**If taking multiple medications, please check labels to avoid duplicating the same active ingredients °**take medication as directed on the label °** Do not exceed 4000 mg of tylenol in 24 hours °**Do not take medications that contain aspirin or ibuprofen ° ° ° ° °

## 2015-11-10 NOTE — MAU Note (Signed)
Since Saturday has not been able to eat or drink, can't keep anything down. Tried to go to work today, could barely stand up.  Has not been seen or given any meds for this

## 2015-11-10 NOTE — MAU Provider Note (Signed)
CC: nausea and vomiting  HPI: [name] is 25 yo G3P2002 4069w6d with history of morning sickness in her previous pregnancies, who presents to clinic with 2 days of nausea and vomiting. She reports not having been able to eat or drink anything, including water, since that time. She decided to visit clinic today after she could barely stand up today at work. Over the last 48 hours she has thrown up at least 5x each day. During her previous pregnancy, she experienced similar nausea and vomiting, but notes that this episode is worse. She denies vomiting up blood. She also denies fever, chills, vaginal discharge or bleeding.   She reports a 10/10 headache that began with the nausea/vomitting.  PMHx:  - HTN - Anemia  - Sickle cell trait   Current Meds:  - Labetalol 100 mg bid - Prenatal MultiVit-Min-Fe-FA 0.8 mg qd  Social Hx: Lives in [place] with two sons (ages 643 and 435 yo). Works at the [employer].  Vitals:  BP 114/82 mmHg  Pulse 94  Temp(Src) 98.1 F (36.7 C) (Oral)  Resp 16  Ht 5\' 3"  (1.6 m)  Wt 69.763 kg (153 lb 12.8 oz)  BMI 27.25 kg/m2  LMP 09/30/2015 (Exact Date)  Exam: General: well appearing, but fatigued  HEENT: normocephalic, atraumatic, moist mucus membranes  Heart: regular rate and rhythm  Lungs: clear to auscultation  Abdomen: soft, non distended, non tender to palpation   Labs: urinalysis with culture    A&P:  1) Nausea/vomiting  - Patient given reglan and tylenol in clinic  - Send patient home with promethazine script for every 4-6 hours as needed   1) UTI  - Keflex 500 qid for 5 days   3) HA - Patient given reglan and tylenol in clinic - If patient doesn't improve in clinic, will give IV fluids and headache cocktail     Kearney HardLauren Ord  3rd year medical student     I have seen and evaluated the patient with the NP/PA/Med student. I agree with the assessment and plan as written above. See provider note for full documentation  Judeth Hornrin  Katsumi Wisler, NP  11/11/2015 12:30 AM

## 2015-11-10 NOTE — MAU Provider Note (Signed)
History     CSN: [id number]  Arrival date and time: 11/10/15 1544   First Provider Initiated Contact with Patient 11/10/15 1719      Chief Complaint  Patient presents with  . Emesis During Pregnancy   HPI Becky Torres is a 25 y.o. G3P2002 at [redacted]w[redacted]d who presents with n/v & headache. Reports nausea & vomiting since finding out she was pregnant that worsened over the weekend. States she has not eaten anything since Saturday and has not been able to keep down fluids today. Reports vomiting constantly since last night. Also reports headache today. Described frontal achy headache that she rates 10/10. Has not taken anything for it. Light makes pain worse.  Denies abdominal pain, vaginal bleeding, diarrhea, constipation, or fever.  Denies dysuria, hematuria, frequency, or flank pain.   OB History    Gravida Para Term Preterm AB TAB SAB Ectopic Multiple Living   0 0 0 0 0 0 2      Past Medical History  Diagnosis Date  . Hypertension     chronic, diagnosed at age 29, takes Labetalol  . Sickle cell trait (HCC)   . Anemia   . Chlamydia     History reviewed. No pertinent past surgical history.  Family History  Problem Relation Age of Onset  . Hypertension Mother   . Hypertension Brother   . Hypertension Maternal Aunt   . Hypertension Maternal Grandmother   . Diabetes Paternal Grandmother     Social History  Substance Use Topics  . Smoking status: Never Smoker   . Smokeless tobacco: Never Used  . Alcohol Use: No    Allergies: No Known Allergies  Prescriptions prior to admission  Medication Sig Dispense Refill Last Dose  . acetaminophen (TYLENOL) 500 MG tablet Take 1,000 mg by mouth every 6 (six) hours as needed for mild pain. Reported on 10/30/2015   Past Month at Unknown time  . labetalol (NORMODYNE) 100 MG tablet Take 1 tablet (100 mg total) by mouth 2 (two) times daily. 60 tablet 3 11/10/2015 at 1200  . Prenatal Multivit-Min-Fe-FA (PRENATAL VITAMINS) 0.8 MG tablet  Take 1 tablet by mouth daily. 30 tablet 12 11/10/2015 at Unknown time  . ibuprofen (ADVIL,MOTRIN) 800 MG tablet Take 1 tablet (800 mg total) by mouth every 8 (eight) hours as needed. (Patient not taking: Reported on 10/30/2015) 30 tablet 0 Not Taking    Review of Systems  Constitutional: Negative for fever and chills.  Eyes: Positive for photophobia.  Gastrointestinal: Positive for nausea and vomiting. Negative for heartburn, abdominal pain, diarrhea and constipation.  Genitourinary: Negative for dysuria, frequency, hematuria and flank pain.       No vaginal bleeding  Neurological: Positive for headaches. Negative for dizziness.   Physical Exam   Blood pressure 114/82, pulse 94, temperature 98.1 F (36.7 C), temperature source Oral, resp. rate 16, height  (1.6 m), weight 153 lb 12.8 oz (69.763 kg), last menstrual period 09/30/2015.  Physical Exam  Nursing note and vitals reviewed. Constitutional: She is oriented to person, place, and time. She appears well-developed and well-nourished. No distress.  HENT:  Head: Normocephalic and atraumatic.  Eyes: Conjunctivae are normal. Right eye exhibits no discharge. Left eye exhibits no discharge. No scleral icterus.  Neck: Normal range of motion.  Cardiovascular: Normal rate, regular rhythm and normal heart sounds.   No murmur heard. Respiratory: Effort normal and breath sounds normal. No respiratory distress. She has no wheezes.  GI: Soft. Bowel sounds are  normal. She exhibits no distension. There is no tenderness. There is no CVA tenderness.  Neurological: She is alert and oriented to person, place, and time.  Skin: Skin is warm and dry. She is not diaphoretic.  Psychiatric: She has a normal mood and affect. Her behavior is normal. Judgment and thought content normal.    MAU Course  Procedures Results for orders placed or performed during the hospital encounter of 11/10/15 (from the past 24 hour(s))  Urinalysis, Routine w reflex  microscopic (not at [facility])     Status: Abnormal   Collection Time: 11/10/15  4:10 PM  Result Value Ref Range   Color, Urine YELLOW YELLOW   APPearance HAZY (A) CLEAR   Specific Gravity, Urine 1.020 1.005 - 1.030   pH 6.0 5.0 - 8.0   Glucose, UA NEGATIVE NEGATIVE mg/dL   Hgb urine dipstick TRACE (A) NEGATIVE   Bilirubin Urine NEGATIVE NEGATIVE   Ketones, ur 15 (A) NEGATIVE mg/dL   Protein, ur NEGATIVE NEGATIVE mg/dL   Nitrite POSITIVE (A) NEGATIVE   Leukocytes, UA MODERATE (A) NEGATIVE  Urine microscopic-add on     Status: Abnormal   Collection Time: 11/10/15  4:10 PM  Result Value Ref Range   Squamous Epithelial / LPF 0-5 (A) NONE SEEN   WBC, UA 6-30 0 - 5 WBC/hpf   RBC / HPF NONE SEEN 0 - 5 RBC/hpf   Bacteria, UA MANY (A) NONE SEEN    MDM U/a shows 15 ketones & 1.020 SG Reglan 10 mg PO & Tylenol 650 mg PO Pt was able to keep down oral meds x 1 hour & reported resolution of headache Vomited after reassessment so will give IV fluids & phenergan 25 mg IV Will tx for UTI d/t +nitrites & send urine for culture  Assessment and Plan  A: 1. Nausea and vomiting during pregnancy prior to [redacted] weeks gestation   2. Headache in pregnancy, antepartum, first trimester   3. UTI in pregnancy, antepartum, first trimester    P: Discharge home Rx promethazine & keflex Discussed reasons to return to MAU Scheduled prenatal care with [facility] (established patient) Urine culture pending   Judeth Hornrin Gari Hartsell 11/10/2015, 5:32 PM

## 2015-11-11 ENCOUNTER — Telehealth (HOSPITAL_COMMUNITY): Payer: Self-pay | Admitting: Obstetrics and Gynecology

## 2015-11-11 ENCOUNTER — Other Ambulatory Visit: Payer: Self-pay | Admitting: Student

## 2015-11-11 DIAGNOSIS — O2341 Unspecified infection of urinary tract in pregnancy, first trimester: Secondary | ICD-10-CM

## 2015-11-11 MED ORDER — CEPHALEXIN 500 MG PO CAPS: 500.0000 mg | ORAL_CAPSULE | Freq: Four times a day (QID) | ORAL | Status: DC

## 2015-11-11 NOTE — Telephone Encounter (Signed)
Rx sent to pharmacy for UTI. Patient needs to be informed of Rx at the pharmacy. Attempted to call X 2, unable to leave message

## 2015-11-12 LAB — CULTURE, OB URINE

## 2015-12-08 ENCOUNTER — Encounter (HOSPITAL_COMMUNITY): Payer: Self-pay

## 2015-12-08 ENCOUNTER — Inpatient Hospital Stay (HOSPITAL_COMMUNITY)
Admission: AD | Admit: 2015-12-08 | Discharge: 2015-12-09 | Disposition: A | Discharge status: Home / Self Care | Payer: Self-pay | Source: Ambulatory Visit | Attending: Obstetrics & Gynecology | Admitting: Obstetrics & Gynecology

## 2015-12-08 DIAGNOSIS — A499 Bacterial infection, unspecified: Secondary | ICD-10-CM

## 2015-12-08 DIAGNOSIS — B9689 Other specified bacterial agents as the cause of diseases classified elsewhere: Secondary | ICD-10-CM

## 2015-12-08 DIAGNOSIS — N760 Acute vaginitis: Secondary | ICD-10-CM

## 2015-12-08 DIAGNOSIS — O021 Missed abortion: Secondary | ICD-10-CM | POA: Insufficient documentation

## 2015-12-08 DIAGNOSIS — O209 Hemorrhage in early pregnancy, unspecified: Secondary | ICD-10-CM

## 2015-12-08 DIAGNOSIS — N390 Urinary tract infection, site not specified: Secondary | ICD-10-CM

## 2015-12-08 NOTE — MAU Note (Signed)
Pt c/o vag bleeding when wiping that started last night. Denies pain.

## 2015-12-09 ENCOUNTER — Inpatient Hospital Stay (HOSPITAL_COMMUNITY): Payer: Self-pay

## 2015-12-09 ENCOUNTER — Encounter (HOSPITAL_COMMUNITY): Payer: Self-pay | Admitting: Advanced Practice Midwife

## 2015-12-09 DIAGNOSIS — O021 Missed abortion: Secondary | ICD-10-CM

## 2015-12-09 LAB — URINALYSIS, ROUTINE W REFLEX MICROSCOPIC
BILIRUBIN URINE: NEGATIVE
GLUCOSE, UA: NEGATIVE mg/dL
KETONES UR: NEGATIVE mg/dL
Nitrite: NEGATIVE
PROTEIN: NEGATIVE mg/dL
Specific Gravity, Urine: 1.02 (ref 1.005–1.030)
pH: 5.5 (ref 5.0–8.0)

## 2015-12-09 LAB — WET PREP, GENITAL
SPERM: NONE SEEN
TRICH WET PREP: NONE SEEN
YEAST WET PREP: NONE SEEN

## 2015-12-09 LAB — HCG, QUANTITATIVE, PREGNANCY: HCG, BETA CHAIN, QUANT, S: 16756 m[IU]/mL — AB (ref <5)

## 2015-12-09 LAB — CBC
HCT: 34 % — ABNORMAL LOW (ref 36.0–46.0)
HEMOGLOBIN: 11.2 g/dL — AB (ref 12.0–15.0)
MCH: 23.5 pg — AB (ref 26.0–34.0)
MCHC: 32.9 g/dL (ref 30.0–36.0)
MCV: 71.4 fL — ABNORMAL LOW (ref 78.0–100.0)
PLATELETS: 244 10*3/uL (ref 150–400)
RBC: 4.76 MIL/uL (ref 3.87–5.11)
RDW: 17.3 % — AB (ref 11.5–15.5)
WBC: 7 10*3/uL (ref 4.0–10.5)

## 2015-12-09 LAB — URINE MICROSCOPIC-ADD ON

## 2015-12-09 MED ORDER — MISOPROSTOL 200 MCG PO TABS: 800.0000 ug | ORAL_TABLET | ORAL | 0 refills | Status: DC

## 2015-12-09 MED ORDER — OXYCODONE-ACETAMINOPHEN 5-325 MG PO TABS: 1.0000 | ORAL_TABLET | ORAL | 0 refills | Status: DC | PRN

## 2015-12-09 MED ORDER — ACETAMINOPHEN 325 MG PO TABS
650.0000 mg | ORAL_TABLET | Freq: Once | ORAL | Status: AC
  Administered 2015-12-09: 650 mg via ORAL
  Filled 2015-12-09: qty 2

## 2015-12-09 MED ORDER — METRONIDAZOLE 500 MG PO TABS: 500.0000 mg | ORAL_TABLET | Freq: Two times a day (BID) | ORAL | 0 refills | Status: DC

## 2015-12-09 MED ORDER — CIPROFLOXACIN HCL 500 MG PO TABS: 500.0000 mg | ORAL_TABLET | Freq: Two times a day (BID) | ORAL | 0 refills | Status: DC

## 2015-12-09 NOTE — Discharge Instructions (Signed)
Incomplete Miscarriage °A miscarriage is the sudden loss of an unborn baby (fetus) before the 20th week of pregnancy. In an incomplete miscarriage, parts of the fetus or placenta (afterbirth) remain in the body.  °Having a miscarriage can be an emotional experience. Talk with your health care provider about any questions you may have about miscarrying, the grieving process, and your future pregnancy plans. °CAUSES  °· Problems with the fetal chromosomes that make it impossible for the baby to develop normally. Problems with the baby's genes or chromosomes are most often the result of errors that occur by chance as the embryo divides and grows. The problems are not inherited from the parents. °· Infection of the cervix or uterus. °· Hormone problems. °· Problems with the cervix, such as having an incompetent cervix. This is when the tissue in the cervix is not strong enough to hold the pregnancy. °· Problems with the uterus, such as an abnormally shaped uterus, uterine fibroids, or congenital abnormalities. °· Certain medical conditions. °· Smoking, drinking alcohol, or taking illegal drugs. °· Trauma. °SYMPTOMS  °· Vaginal bleeding or spotting, with or without cramps or pain. °· Pain or cramping in the abdomen or lower back. °· Passing fluid, tissue, or blood clots from the vagina. °DIAGNOSIS  °Your health care provider will perform a physical exam. You may also have an ultrasound to confirm the miscarriage. Blood or urine tests may also be ordered. °TREATMENT  °· Usually, a dilation and curettage (D&C) procedure is performed. During a D&C procedure, the cervix is widened (dilated) and any remaining fetal or placental tissue is gently removed from the uterus. °· Antibiotic medicines are prescribed if there is an infection. Other medicines may be given to reduce the size of the uterus (contract) if there is a lot of bleeding. °· If you have Rh negative blood and your baby was Rh positive, you will need a Rho (D)  immune globulin shot. This shot will protect any future baby from having Rh blood problems in future pregnancies. °· You may be confined to bed rest. This means you should stay in bed and only get up to use the bathroom. °HOME CARE INSTRUCTIONS  °· Rest as directed by your health care provider. °· Restrict activity as directed by your health care provider. You may be allowed to continue light activity if curettage was not done but you require further treatment. °· Keep track of the number of pads you use each day. Keep track of how soaked (saturated) they are. Record this information. °· Do not  use tampons. °· Do not douche or have sexual intercourse until approved by your health care provider. °· Keep all follow-up appointments for reevaluation and continuing management. °· Only take over-the-counter or prescription medicines for pain, fever, or discomfort as directed by your health care provider. °· Take antibiotic medicine as directed by your health care provider. Make sure you finish it even if you start to feel better. °SEEK IMMEDIATE MEDICAL CARE IF:  °· You experience severe cramps in your stomach, back, or abdomen. °· You have an unexplained temperature (make sure to record these temperatures). °· You pass large clots or tissue (save these for your health care provider to inspect). °· Your bleeding increases. °· You become light-headed, weak, or have fainting episodes. °MAKE SURE YOU:  °· Understand these instructions. °· Will watch your condition. °· Will get help right away if you are not doing well or get worse. °  °This information is not intended to   replace advice given to you by your health care provider. Make sure you discuss any questions you have with your health care provider.   Document Released: 04/19/2005 Document Revised: 05/10/2014 Document Reviewed: 11/16/2012 Elsevier Interactive Patient Education 2016 Elsevier Inc.  FACTS YOU SHOULD KNOW  WHAT IS AN EARLY PREGNANCY FAILURE? Once  the egg is fertilized with the sperm and begins to develop, it attaches to the lining of the uterus. This early pregnancy tissue may not develop into an embryo (the beginning stage of a baby). Sometimes an embryo does develop but does not continue to grow. These problems can be seen on ultrasound.   MANAGEMNT OF EARLY PREGNANCY FAILURE: About 4 out of 100 (0.25%) women will have a pregnancy loss in her lifetime.  One in five pregnancies is found to be an early pregnancy failure.  There are 3 ways to care for an early pregnancy failure:   (1) Surgery, (2) Medicine, (3) Waiting for you to pass the pregnancy on your own. The decision as to how to proceed after being diagnosed with and early pregnancy failure is an individual one.  The decision can be made only after appropriate counseling.  You need to weigh the pros and cons of the 3 choices. Then you can make the choice that works for you. SURGERY (D&E)  Procedure over in 1 day  Requires being put to sleep  Bleeding may be light  Possible problems during surgery, including injury to womb(uterus)  Care provider has more control Medicine (CYTOTEC)  The complete procedure may take days to weeks  No Surgery  Bleeding may be heavy at times  There may be drug side effects  Patient has more control Waiting  You may choose to wait, in which case your own body may complete the passing of the abnormal early pregnancy on its own in about 2-4 weeks  Your bleeding may be heavy at times  There is a small possibility that you may need surgery if the bleeding is too much or not all of the pregnancy has passed. CYTOTEC MANAGEMENT Prostaglandins (cytotec) are the most widely used drug for this purpose. They cause the uterus to cramp and contract. You will place the medicine yourself inside your vagina in the privacy of your home. Empting of the uterus should occur within 3 days but the process may continue for several weeks. The bleeding may seem  heavy at times. POSSIBLE SIDE EFFECTS FROM CYTOTEC  Nausea   Vomiting  Diarrhea Fever  Chills  Hot Flashes Side effects  from the process of the early pregnancy failure include:  Cramping  Bleeding  Headaches  Dizziness RISKS: This is a low risk procedure. Less than 1 in 100 women has a complication. An incomplete passage of the early pregnancy may occur. Also, Hemorrhage (heavy bleeding) could happen.  Rarely the pregnancy will not be passed completely. Excessively heavy bleeding may occur.  Your doctor may need to perform surgery to empty the uterus (D&E). Afterwards: Everybody will feel differently after the early pregnancy completion. You may have soreness or cramps for a day or two. You may have soreness or cramps for day or two.  You may have light bleeding for up to 2 weeks. You may be as active as you feel like being. If you have any of the following problems you may call Maternity Admissions Unit at [phone].  If you have pain that does not get better  with pain medication  Bleeding that soaks through 2 thick full-sized  sanitary pads in an hour °• Cramps that last longer than 2 days °• Foul smelling discharge °• Fever above 100.4 degrees F °Even if you do not have any of these symptoms, you should have a follow-up exam to make sure you are healing properly. This appointment will be made for you before you leave the hospital. Your next normal period will start again in 4-6 week after the loss. You can get pregnant soon after the loss, so use birth control right away. °Finally: °Make sure all your questions are answered before during and after any procedure. Follow up with medical care and family planning methods. ° °  ° ° °

## 2015-12-09 NOTE — MAU Provider Note (Signed)
Chief Complaint: Vaginal Bleeding   First Provider Initiated Contact with Patient 12/09/15 0009     SUBJECTIVE HPI: Becky Torres is a 25 y.o. G3P2002 at [redacted]w[redacted]d by LMP who presents to Maternity Admissions reporting vaginal bleeding since yesterday. No ultrasounds or hCG's this pregnancy.   Severity: Lighter than a period Duration: 24 hours Course: Unchanged Associated signs and symptoms: Negative for fever, chills, abdominal pain, vaginal discharge or passage of clots or tissue.  Past Medical History:  Diagnosis Date  . Anemia   . Chlamydia   . Hypertension    chronic, diagnosed at age 31, takes Labetalol  . Sickle cell trait (HCC)    OB History  Gravida Para Term Preterm AB Living  3 2 2  0 0 2  SAB TAB Ectopic Multiple Live Births  0 0 0 0 2    # Outcome Date GA Lbr Len/2nd Weight Sex Delivery Anes PTL Lv  3 Current           2 Term 09/19/12 [redacted]w[redacted]d 14:08 / 00:52 5 lb 11.4 oz (2.59 kg) M Vag-Spont EPI  LIV  1 Term 07/26/10    M Vag-Spont EPI N LIV     History reviewed. No pertinent surgical history. Social History   Social History  . Marital status: Single    Spouse name: N/A  . Number of children: N/A  . Years of education: N/A   Occupational History  . Not on file.   Social History Main Topics  . Smoking status: Never Smoker  . Smokeless tobacco: Never Used  . Alcohol use No  . Drug use: No  . Sexual activity: Yes    Birth control/ protection: None     Comment: dec 2015 last sex   Other Topics Concern  . Not on file   Social History Narrative  . No narrative on file   No current facility-administered medications on file prior to encounter.    Current Outpatient Prescriptions on File Prior to Encounter  Medication Sig Dispense Refill  . acetaminophen (TYLENOL) 500 MG tablet Take 1,000 mg by mouth every 6 (six) hours as needed for mild pain. Reported on 10/30/2015    . cephALEXin (KEFLEX) 500 MG capsule Take 1 capsule (500 mg total) by mouth 4 (four)  times daily. 28 capsule 0  . labetalol (NORMODYNE) 100 MG tablet Take 1 tablet (100 mg total) by mouth 2 (two) times daily. 60 tablet 3  . Prenatal Multivit-Min-Fe-FA (PRENATAL VITAMINS) 0.8 MG tablet Take 1 tablet by mouth daily. 30 tablet 12  . promethazine (PHENERGAN) 12.5 MG tablet Take 1 tablet (12.5 mg total) by mouth every 6 (six) hours as needed for nausea or vomiting. 30 tablet 0   No Known Allergies  I have reviewed the past Medical Hx, Surgical Hx, Social Hx, Allergies and Medications.   Review of Systems  Constitutional: Negative for chills and fever.  Gastrointestinal: Negative for abdominal pain.  Genitourinary: Positive for vaginal bleeding. Negative for hematuria and vaginal discharge.  Musculoskeletal: Negative for back pain.    OBJECTIVE Patient Vitals for the past 24 hrs:  BP Temp Temp src Pulse Resp SpO2 Height Weight  12/08/15 2345 135/81 98.2 F (36.8 C) Oral 66 16 100 % - -  12/08/15 2336 - - - - - - 5\' 3"  (1.6 m) 152 lb (68.9 kg)   Constitutional: Well-developed, well-nourished female in no acute distress.  Cardiovascular: normal rate Respiratory: normal rate and effort.  GI: Abd soft, non-tender. MS: Extremities nontender,  no edema, normal ROM Neurologic: Alert and oriented x 4.  GU: Neg CVAT.  SPECULUM EXAM: NEFG, physiologic discharge, Scant dark red blood noted, cervix clean  BIMANUAL: cervix closed; uterus 8-9-week size, no adnexal tenderness or masses. No CMT.  LAB RESULTS Results for orders placed or performed during the hospital encounter of 12/08/15 (from the past 24 hour(s))  Urinalysis, Routine w reflex microscopic (not at [facility])     Status: Abnormal   Collection Time: 12/08/15 11:32 PM  Result Value Ref Range   Color, Urine YELLOW YELLOW   APPearance CLEAR CLEAR   Specific Gravity, Urine 1.020 1.005 - 1.030   pH 5.5 5.0 - 8.0   Glucose, UA NEGATIVE NEGATIVE mg/dL   Hgb urine dipstick LARGE (A) NEGATIVE   Bilirubin Urine NEGATIVE NEGATIVE    Ketones, ur NEGATIVE NEGATIVE mg/dL   Protein, ur NEGATIVE NEGATIVE mg/dL   Nitrite NEGATIVE NEGATIVE   Leukocytes, UA SMALL (A) NEGATIVE  Urine microscopic-add on     Status: Abnormal   Collection Time: 12/08/15 11:32 PM  Result Value Ref Range   Squamous Epithelial / LPF 0-5 (A) NONE SEEN   WBC, UA 6-30 0 - 5 WBC/hpf   RBC / HPF 0-5 0 - 5 RBC/hpf   Bacteria, UA FEW (A) NONE SEEN  hCG, quantitative, pregnancy     Status: Abnormal   Collection Time: 12/09/15 12:13 AM  Result Value Ref Range   hCG, Beta Chain, Quant, S 16,756 (H) <5 mIU/mL  CBC     Status: Abnormal   Collection Time: 12/09/15 12:13 AM  Result Value Ref Range   WBC 7.0 4.0 - 10.5 K/uL   RBC 4.76 3.87 - 5.11 MIL/uL   Hemoglobin 11.2 (L) 12.0 - 15.0 g/dL   HCT 16.1 (L) 09.6 - 04.5 %   MCV 71.4 (L) 78.0 - 100.0 fL   MCH 23.5 (L) 26.0 - 34.0 pg   MCHC 32.9 30.0 - 36.0 g/dL   RDW 40.9 (H) 81.1 - 91.4 %   Platelets 244 150 - 400 K/uL  Wet prep, genital     Status: Abnormal   Collection Time: 12/09/15  1:15 AM  Result Value Ref Range   Yeast Wet Prep HPF POC NONE SEEN NONE SEEN   Trich, Wet Prep NONE SEEN NONE SEEN   Clue Cells Wet Prep HPF POC PRESENT (A) NONE SEEN   WBC, Wet Prep HPF POC FEW (A) NONE SEEN   Sperm NONE SEEN     IMAGING US Ob Comp Less 14 Wks  Result Date: 12/09/2015 CLINICAL DATA:  Pregnant patient in first-trimester pregnancy with vaginal bleeding. EXAM: OBSTETRIC <14 WK [place] AND TRANSVAGINAL OB US TECHNIQUE: Both transabdominal and transvaginal ultrasound examinations were performed for complete evaluation of the gestation as well as the maternal uterus, adnexal regions, and pelvic cul-de-sac. Transvaginal technique was performed to assess early pregnancy. COMPARISON:  None. FINDINGS: Intrauterine gestational sac: Single, irregular in shape Yolk sac:  Not visualized. Embryo:  Not visualized. MSD: 31.3  mm   8 w   2  d Subchorionic hemorrhage:  None visualized. Maternal uterus/adnexae: Corpus luteal  cyst in the right ovary measures 1.7 cm. Ovarian blood flow is seen. The left ovary is normal. There is no pelvic free fluid. IMPRESSION: Irregular appearing intrauterine gestational sac with mean sac diameter of 31 mm, no yolk sac or fetal pole. Findings meet definitive criteria for failed pregnancy. This follows SRU consensus guidelines: Diagnostic Criteria for Nonviable Pregnancy Early in the First Trimester.  [facility] [phone]. Electronically Signed   By: Rubye Oaks M.D.   On: 12/09/2015 01:18   US Ob Transvaginal  Result Date: 12/09/2015 CLINICAL DATA:  Pregnant patient in first-trimester pregnancy with vaginal bleeding. EXAM: OBSTETRIC <14 WK [place] AND TRANSVAGINAL OB US TECHNIQUE: Both transabdominal and transvaginal ultrasound examinations were performed for complete evaluation of the gestation as well as the maternal uterus, adnexal regions, and pelvic cul-de-sac. Transvaginal technique was performed to assess early pregnancy. COMPARISON:  None. FINDINGS: Intrauterine gestational sac: Single, irregular in shape Yolk sac:  Not visualized. Embryo:  Not visualized. MSD: 31.3  mm   8 w   2  d Subchorionic hemorrhage:  None visualized. Maternal uterus/adnexae: Corpus luteal cyst in the right ovary measures 1.7 cm. Ovarian blood flow is seen. The left ovary is normal. There is no pelvic free fluid. IMPRESSION: Irregular appearing intrauterine gestational sac with mean sac diameter of 31 mm, no yolk sac or fetal pole. Findings meet definitive criteria for failed pregnancy. This follows SRU consensus guidelines: Diagnostic Criteria for Nonviable Pregnancy Early in the First Trimester. [facility] J Med [phone]. Electronically Signed   By: Rubye Oaks M.D.   On: 12/09/2015 01:18    MAU COURSE CBC, Quant, ABO/Rh, ultrasound, wet prep and GC/chlamydia culture, UA.  Informed patient of missed AB. Discussed options of expectant management, Cytotec and D&C. Patient requests Cytotec. Will  administer at home.      Early Intrauterine Pregnancy Failure  _x__  Documented intrauterine pregnancy failure less than or equal to [redacted] weeks gestation  _x__  No serious current illness  _x__  Baseline Hgb greater than or equal to 10g/dl  _x__  Patient has easily accessible transportation to the hospital  _x__  Clear preference  _x__  Practitioner/physician deems patient reliable  _x__  Counseling by practitioner or physician  _x__  Patient education by RN  _x__  Verbal consent given  _NA_  Rho-Gam given by RN if indicated  _x__ Medication dispensed   _x__   Cytotec 800 mcg  _x_   Intravaginally by patient at home         __   Intravaginally by RN in MAU        __   Rectally by patient at home        __   Rectally by RN in MAU  ___  Ibuprofen 600 mg 1 tablet by mouth every 6 hours as needed #30  _x__  Hydrocodone/acetaminophen 5/325 mg by mouth every 4 to 6 hours as needed  _x__  Phenergan 12.5 mg by mouth every 4 hours as needed for nausea   MDM - Missed AB, but hemodynamically stable.  - Still appears to have UTI from previous visit. Has not taken a than half of Keflex, probably due to nausea and vomiting and frequency doses. Will DC Keflex and give Cipro 3 days.   ASSESSMENT 1. Missed abortion   2. Vaginal bleeding in pregnancy, first trimester   3.      BV  PLAN Discharge home in stable condition. Bleeding and infection precautions Support given. Offered chaplain, declined Follow-up Information    [facility] Follow up on 12/24/2015.   Specialty:  Obstetrics and Gynecology Why:  for follow-up appointment Contact information: [address] [place] [address] [phone]       [facility] [place] MATERNITY ADMISSIONS .   Why:  as needed in emergencies Contact information: [address]  [address] [id number] [place] [phone]           Medication List     STOP taking these medications   cephALEXin 500 MG capsule Commonly known as:  KEFLEX     TAKE these medications   acetaminophen 500 MG tablet Commonly known as:  TYLENOL Take 1,000 mg by mouth every 6 (six) hours as needed for mild pain. Reported on 10/30/2015   ciprofloxacin 500 MG tablet Commonly known as:  CIPRO Take 1 tablet (500 mg total) by mouth 2 (two) times daily.   labetalol 100 MG tablet Commonly known as:  NORMODYNE Take 1 tablet (100 mg total) by mouth 2 (two) times daily.   metroNIDAZOLE 500 MG tablet Commonly known as:  FLAGYL Take 1 tablet (500 mg total) by mouth 2 (two) times daily.   misoprostol 200 MCG tablet Commonly known as:  CYTOTEC Place 4 tablets (800 mcg total) vaginally as directed. If you do not pass pregnancy tissue within 24 hours, take second dose. If you do not pass pregnancy tissue within 24 hours after 2nd dose call the office at [phone] to discuss D&C.   oxyCODONE-acetaminophen 5-325 MG tablet Commonly known as:  PERCOCET/ROXICET Take 1-2 tablets by mouth every 4 (four) hours as needed for severe pain.   Prenatal Vitamins 0.8 MG tablet Take 1 tablet by mouth daily.   promethazine 12.5 MG tablet Commonly known as:  PHENERGAN Take 1 tablet (12.5 mg total) by mouth every 6 (six) hours as needed for nausea or vomiting.        [place] Halley Shepheard, CNM 12/09/2015  2:16 AM  4

## 2015-12-10 LAB — GC/CHLAMYDIA PROBE AMP (~~LOC~~) NOT AT ARMC
Chlamydia: NEGATIVE
NEISSERIA GONORRHEA: NEGATIVE

## 2015-12-10 LAB — CULTURE, OB URINE
Culture: NO GROWTH
Special Requests: NORMAL

## 2015-12-10 LAB — HIV ANTIBODY (ROUTINE TESTING W REFLEX): HIV SCREEN 4TH GENERATION: NONREACTIVE

## 2015-12-24 ENCOUNTER — Ambulatory Visit: Payer: Medicaid Other | Admitting: Obstetrics & Gynecology

## 2016-05-03 NOTE — L&D Delivery Note (Signed)
Delivery Note At 9:23 AM a viable female was delivered via Vaginal, Spontaneous Delivery (Presentation: vertex; ROA ).  APGAR: 9, 9; weight  pending.   Placenta status: intact, grossly normal.  Cord: 3 vessel  with no cmplications.  Cord pH: not drawn d/t uncomplicated delivery  Anesthesia:  epidural Episiotomy: None Lacerations: 1st degree perineal, very superficial and hemostatic Suture Repair: not required Est. Blood Loss (mL): 100  Mom was noted to be complete and with station at +2.  Baby delivered ROA and shoulders delivered without difficulty.  She was vigorous and immediately placed on Mom's abdomen. APGARs were 9 and 9.  Placenta then delivered spontaneously and noted to be intact.  Fundus was firm.  Bleeding was minimal (100cc).  After inspection of cervix, vaginal walls and perineum, there was a 1st degree perineal laceration that was hemostatic and very superficial, so no suture repair was needed.  Mom and baby are transferred in stable condition to Mother/Baby unit.   Mom to postpartum.  Baby to Couplet care / Skin to Skin.  Lennox SoldersAmanda C Winfrey, MD 12/31/2016, 9:41 AM  Patient is a [id number] at 5772w2d who was admitted in [facility], [facility].  She progressed with augmentation via Pit.  I was gloved and present for delivery in its entirety.  Second stage of labor progressed, baby delivered after less than 10 contractions.  Mild decels during second stage noted.  Complications: none  Lacerations: none  EBL: 100cc  Cam HaiSHAW, KIMBERLY, CNM 10:05 AM 12/31/2016

## 2016-06-06 ENCOUNTER — Inpatient Hospital Stay (HOSPITAL_COMMUNITY): Payer: Medicaid Other

## 2016-06-06 ENCOUNTER — Encounter (HOSPITAL_COMMUNITY): Payer: Self-pay

## 2016-06-06 ENCOUNTER — Inpatient Hospital Stay (HOSPITAL_COMMUNITY)
Admission: AD | Admit: 2016-06-06 | Discharge: 2016-06-06 | Disposition: A | Discharge status: Home / Self Care | Payer: Medicaid Other | Source: Ambulatory Visit | Attending: Obstetrics & Gynecology | Admitting: Obstetrics & Gynecology

## 2016-06-06 DIAGNOSIS — O210 Mild hyperemesis gravidarum: Secondary | ICD-10-CM | POA: Insufficient documentation

## 2016-06-06 DIAGNOSIS — Z3A08 8 weeks gestation of pregnancy: Secondary | ICD-10-CM | POA: Insufficient documentation

## 2016-06-06 DIAGNOSIS — R1114 Bilious vomiting: Secondary | ICD-10-CM | POA: Diagnosis not present

## 2016-06-06 DIAGNOSIS — O26891 Other specified pregnancy related conditions, first trimester: Secondary | ICD-10-CM | POA: Insufficient documentation

## 2016-06-06 DIAGNOSIS — O26899 Other specified pregnancy related conditions, unspecified trimester: Secondary | ICD-10-CM

## 2016-06-06 DIAGNOSIS — R109 Unspecified abdominal pain: Secondary | ICD-10-CM | POA: Insufficient documentation

## 2016-06-06 LAB — COMPREHENSIVE METABOLIC PANEL
ALT: 36 U/L (ref 14–54)
AST: 34 U/L (ref 15–41)
Albumin: 4 g/dL (ref 3.5–5.0)
Alkaline Phosphatase: 47 U/L (ref 38–126)
Anion gap: 10 (ref 5–15)
BILIRUBIN TOTAL: 0.6 mg/dL (ref 0.3–1.2)
BUN: 7 mg/dL (ref 6–20)
CO2: 24 mmol/L (ref 22–32)
CREATININE: 0.66 mg/dL (ref 0.44–1.00)
Calcium: 9.6 mg/dL (ref 8.9–10.3)
Chloride: 98 mmol/L — ABNORMAL LOW (ref 101–111)
GFR calc Af Amer: >60 mL/min (ref >60)
Glucose, Bld: 87 mg/dL (ref 65–99)
Potassium: 3.6 mmol/L (ref 3.5–5.1)
Sodium: 132 mmol/L — ABNORMAL LOW (ref 135–145)
TOTAL PROTEIN: 8.1 g/dL (ref 6.5–8.1)

## 2016-06-06 LAB — URINALYSIS, ROUTINE W REFLEX MICROSCOPIC
Glucose, UA: NEGATIVE mg/dL
KETONES UR: NEGATIVE mg/dL
NITRITE: NEGATIVE
PROTEIN: NEGATIVE mg/dL
Specific Gravity, Urine: 1.025 (ref 1.005–1.030)
pH: 6 (ref 5.0–8.0)

## 2016-06-06 LAB — CBC
HCT: 34.6 % — ABNORMAL LOW (ref 36.0–46.0)
Hemoglobin: 11.8 g/dL — ABNORMAL LOW (ref 12.0–15.0)
MCH: 23.9 pg — ABNORMAL LOW (ref 26.0–34.0)
MCHC: 34.1 g/dL (ref 30.0–36.0)
MCV: 70.2 fL — ABNORMAL LOW (ref 78.0–100.0)
PLATELETS: 288 10*3/uL (ref 150–400)
RBC: 4.93 MIL/uL (ref 3.87–5.11)
RDW: 17.6 % — AB (ref 11.5–15.5)
WBC: 7.2 10*3/uL (ref 4.0–10.5)

## 2016-06-06 LAB — POCT PREGNANCY, URINE: Preg Test, Ur: POSITIVE — AB

## 2016-06-06 LAB — WET PREP, GENITAL
Clue Cells Wet Prep HPF POC: NONE SEEN
Sperm: NONE SEEN
Trich, Wet Prep: NONE SEEN
WBC, Wet Prep HPF POC: NONE SEEN
YEAST WET PREP: NONE SEEN

## 2016-06-06 LAB — URINALYSIS, MICROSCOPIC (REFLEX)

## 2016-06-06 LAB — ABO/RH: ABO/RH(D): B POS

## 2016-06-06 LAB — OB RESULTS CONSOLE GC/CHLAMYDIA: Gonorrhea: NEGATIVE

## 2016-06-06 LAB — HCG, QUANTITATIVE, PREGNANCY: HCG, BETA CHAIN, QUANT, S: 186408 m[IU]/mL — AB (ref <5)

## 2016-06-06 MED ORDER — SODIUM CHLORIDE 0.9 % IV SOLN
Freq: Once | INTRAVENOUS | Status: AC
  Administered 2016-06-06: 18:00:00 via INTRAVENOUS
  Filled 2016-06-06: qty 1000

## 2016-06-06 MED ORDER — PROMETHAZINE HCL 25 MG/ML IJ SOLN
25.0000 mg | Freq: Four times a day (QID) | INTRAMUSCULAR | Status: DC | PRN
Start: 2016-06-06 — End: 2016-06-06
  Administered 2016-06-06: 25 mg via INTRAVENOUS
  Filled 2016-06-06: qty 1

## 2016-06-06 MED ORDER — PROMETHAZINE HCL 25 MG PO TABS: 25.0000 mg | ORAL_TABLET | Freq: Four times a day (QID) | ORAL | 1 refills | Status: DC | PRN

## 2016-06-06 MED ORDER — METRONIDAZOLE 500 MG PO TABS: 500.0000 mg | ORAL_TABLET | Freq: Three times a day (TID) | ORAL | 0 refills | Status: AC

## 2016-06-06 MED ORDER — PROMETHAZINE HCL 25 MG/ML IJ SOLN: 25.0000 mg | Freq: Four times a day (QID) | INTRAMUSCULAR | 0 refills | Status: DC | PRN

## 2016-06-06 MED ORDER — METOCLOPRAMIDE HCL 5 MG/ML IJ SOLN: 5.0000 mg | Freq: Four times a day (QID) | INTRAMUSCULAR | Status: DC

## 2016-06-06 NOTE — MAU Note (Signed)
Pt stated she has been having N/v since Monday. Unable to keep much down. C/O body ache no fever no diarrhea.

## 2016-06-06 NOTE — Discharge Instructions (Signed)
Eating Plan for Hyperemesis Gravidarum °Hyperemesis gravidarum is a severe form of morning sickness. Because this condition causes severe nausea and vomiting, it can lead to dehydration, malnutrition, and weight loss. One way to lessen the symptoms of nausea and vomiting is to follow the eating plan for hyperemesis gravidarum. It is often used along with prescribed medicines to control your symptoms. °What can I do to relieve my symptoms? °Listen to your body. Everyone is different and has different preferences. Find what works best for you. Take any of the following actions that are helpful to you: °· Eat and drink slowly. °· Eat 5-6 small meals daily instead of 3 large meals. °· Eat crackers before you get out of bed in the morning. °· Try having a snack in the middle of the night. °· Starchy foods are usually tolerated well. Examples include cereal, toast, bread, potatoes, pasta, rice, and pretzels. °· Ginger may help with nausea. Add ¼ tsp ground ginger to hot tea or choose ginger tea. °· Try drinking 100% fruit juice or an electrolyte drink. An electrolyte drink contains sodium, potassium, and chloride. °· Continue to take your prenatal vitamins as told by your health care provider. If you are having trouble taking your prenatal vitamins, talk with your health care provider about different options. °· Include at least 1 serving of protein with your meals and snacks. Protein options include meats or poultry, beans, nuts, eggs, and yogurt. Try eating a protein-rich snack before bed. Examples of these snacks include cheese and crackers or half of a peanut butter or turkey sandwich. °· Consider eliminating foods that trigger your symptoms. These may include spicy foods, coffee, high-fat foods, very sweet foods, and acidic foods. °· Try meals that have more protein combined with bland, salty, lower-fat, and dry foods, such as nuts, seeds, pretzels, crackers, and cereal. °· Talk with your healthcare provider about  starting a supplement of vitamin B6. °· Have fluids that are cold, clear, and carbonated or sour. Examples include lemonade, ginger ale, lemon-lime soda, ice water, and sparkling water. °· Try lemon or mint tea. °· Try brushing your teeth or using a mouth rinse after meals. °What should I avoid to reduce my symptoms? °Avoiding some of the following things may help reduce your symptoms. °· Foods with strong smells. Try eating meals in well-ventilated areas that are free of odors. °· Drinking water or other beverages with meals. Try not to drink anything during the 30 minutes before and after your meals. °· Drinking more than 1 cup of fluid at a time. Sometimes using a straw helps. °· Fried or high-fat foods, such as butter and cream sauces. °· Spicy foods. °· Skipping meals as best as you can. Nausea can be more intense on an empty stomach. If you cannot tolerate food at that time, do not force it. Try sucking on ice chips or other frozen items, and make up for missed calories later. °· Lying down within 2 hours after eating. °· Environmental triggers. These may include smoky rooms, closed spaces, rooms with strong smells, warm or humid places, overly loud and noisy rooms, and rooms with motion or flickering lights. °· Quick and sudden changes in your movement. °This information is not intended to replace advice given to you by your health care provider. Make sure you discuss any questions you have with your health care provider. °Document Released: 02/14/2007 Document Revised: 12/17/2015 Document Reviewed: 11/18/2015 °Elsevier Interactive Patient Education © 2017 Else[employer] Inc. ° °

## 2016-06-06 NOTE — MAU Provider Note (Signed)
History   Patient Colon BranchKalisa Mcconville is a 26 year old G4P2012 at 7 weeks and 5 days by LMP.  She had a positive home pregnancy test on May 13, 2016.   CSN: [id number]  Arrival date and time: 06/06/16 1511   None     Chief Complaint  Patient presents with  . Emesis During Pregnancy   Emesis   This is a recurrent problem. The current episode started in the past 7 days. The problem occurs more than 10 times per day. The problem has been unchanged. The emesis has an appearance of stomach contents and bile. There has been no fever. Associated symptoms include abdominal pain. Pertinent negatives include no arthralgias, chest pain, chills, coughing, diarrhea, dizziness, fever, headaches, myalgias, sweats, URI or weight loss. Treatments tried: tried crackers, tea, soup and toast. The treatment provided no relief.  Abdominal Pain  This is a new problem. The current episode started in the past 7 days. The onset quality is gradual. The problem occurs intermittently. The pain is located in the periumbilical region. The pain is at a severity of 10/10. The quality of the pain is cramping. The abdominal pain radiates to the back. Associated symptoms include vomiting. Pertinent negatives include no anorexia, arthralgias, belching, constipation, diarrhea, dysuria, fever, flatus, frequency, headaches, hematochezia, hematuria, melena, myalgias, nausea or weight loss. Exacerbated by: eating. The pain is relieved by nothing. She has tried nothing for the symptoms. There is no history of abdominal surgery, colon cancer, Crohn's disease, gallstones, GERD, irritable bowel syndrome, pancreatitis, PUD or ulcerative colitis.    OB History    Gravida Para Term Preterm AB Living   4 2 2  0 1 2   SAB TAB Ectopic Multiple Live Births   1 0 0 0 2      Past Medical History:  Diagnosis Date  . Anemia   . Chlamydia   . Hypertension    chronic, diagnosed at age 89, takes Labetalol  . Sickle cell trait (HCC)      History reviewed. No pertinent surgical history.  Family History  Problem Relation Age of Onset  . Hypertension Mother   . Hypertension Brother   . Hypertension Maternal Aunt   . Hypertension Maternal Grandmother   . Diabetes Paternal Grandmother     Social History  Substance Use Topics  . Smoking status: Never Smoker  . Smokeless tobacco: Never Used  . Alcohol use No    Allergies: No Known Allergies  Prescriptions Prior to Admission  Medication Sig Dispense Refill Last Dose  . labetalol (NORMODYNE) 100 MG tablet Take 100 mg by mouth 2 (two) times daily.     . Prenatal MV-Min-FA-Omega-3 (PRENATAL GUMMIES/DHA & FA) 0.4-32.5 MG CHEW Chew 1 capsule by mouth 2 (two) times daily.   06/06/2016 at Unknown time    Review of Systems  Constitutional: Negative for chills, fever and weight loss.  Respiratory: Negative for cough.   Cardiovascular: Negative for chest pain.  Gastrointestinal: Positive for abdominal pain and vomiting. Negative for anorexia, constipation, diarrhea, flatus, hematochezia, melena and nausea.  Genitourinary: Negative for dysuria, frequency and hematuria.  Musculoskeletal: Negative for arthralgias and myalgias.  Neurological: Negative for dizziness and headaches.   Physical Exam   Blood pressure (!) 138/106, pulse 101, temperature 98.6 F (37 C), resp. rate 18, height 5\' 4"  (1.626 m), weight 136 lb (61.7 kg), last menstrual period 04/13/2016, unknown if currently breastfeeding.  Physical Exam  Constitutional: She is oriented to person, place, and time. She appears well-developed.  HENT:  Head: Normocephalic.  Neck: Normal range of motion.  Respiratory: Effort normal and breath sounds normal. No respiratory distress. She has no wheezes. She has no rales.  GI: Soft. Bowel sounds are normal. She exhibits no distension and no mass. There is no tenderness. There is no rebound and no guarding.  Genitourinary:  Genitourinary Comments: NEFG, vaginal walls pink  with no lesions, Cervix is pink with no lesions. No CMT, no suprapubic tenderness or adnexal tenderness. Scant white vaginal discharge in the vaginal vault.   Musculoskeletal: Normal range of motion.  Neurological: She is alert and oriented to person, place, and time.  Skin: Skin is warm and dry.  Psychiatric: She has a normal mood and affect.  Vitals are elevated because she says she has not been able to keep down her labetalol due to vomiting.   MAU Course  Procedures  MDM -CBC, ABO, beta is [id number] -[place]: SIUP at 8 weeks and 4 days with cardiac activity 174 bpm -UA: normal  -wet prep: positive for clue  Assessment and Plan   1. Bilious vomiting with nausea   2. Abdominal pain affecting pregnancy     2. D/C home with instructions to eat small, high protein meals and try start taking 1/2 tab of phenergan in the morning as well as try 25 mg of Vitamin B 6 three times a day and 1 Unisom tablet at night. As soon as her nausea is under control, resume labetalol.  Patient verbalized understanding 3. Patient will fill RX for flagyl but will not start under her nausea is under control.  3. Patient to apply for pregnancy medicaid and begin prenatal care at [facility].   Charlesetta Garibaldi Sarvesh Meddaugh CNM 06/06/2016, 4:04 PM

## 2016-06-07 LAB — GC/CHLAMYDIA PROBE AMP (~~LOC~~) NOT AT ARMC
Chlamydia: NEGATIVE
Neisseria Gonorrhea: NEGATIVE

## 2016-07-06 NOTE — Telephone Encounter (Signed)
See "Reason for call" 

## 2016-08-24 ENCOUNTER — Encounter: Payer: Self-pay | Admitting: Family Medicine

## 2016-09-30 ENCOUNTER — Other Ambulatory Visit (HOSPITAL_COMMUNITY)
Admission: RE | Admit: 2016-09-30 | Discharge: 2016-09-30 | Disposition: A | Discharge status: Home / Self Care | Payer: Medicaid Other | Source: Ambulatory Visit | Attending: Obstetrics | Admitting: Obstetrics

## 2016-09-30 ENCOUNTER — Ambulatory Visit (INDEPENDENT_AMBULATORY_CARE_PROVIDER_SITE_OTHER): Payer: Medicaid Other | Admitting: Obstetrics

## 2016-09-30 ENCOUNTER — Encounter: Payer: Self-pay | Admitting: Obstetrics

## 2016-09-30 VITALS — BP 132/90 | HR 99 | Wt 152.0 lb

## 2016-09-30 DIAGNOSIS — O0932 Supervision of pregnancy with insufficient antenatal care, second trimester: Secondary | ICD-10-CM

## 2016-09-30 DIAGNOSIS — Z3480 Encounter for supervision of other normal pregnancy, unspecified trimester: Secondary | ICD-10-CM | POA: Insufficient documentation

## 2016-09-30 DIAGNOSIS — Z3A00 Weeks of gestation of pregnancy not specified: Secondary | ICD-10-CM | POA: Diagnosis not present

## 2016-09-30 DIAGNOSIS — Z3482 Encounter for supervision of other normal pregnancy, second trimester: Secondary | ICD-10-CM | POA: Diagnosis not present

## 2016-09-30 DIAGNOSIS — I10 Essential (primary) hypertension: Secondary | ICD-10-CM

## 2016-09-30 NOTE — Progress Notes (Signed)
Patient is in the office for initial ob visit, reports good fetal movement, denies pain.

## 2016-09-30 NOTE — Patient Instructions (Addendum)

## 2016-10-01 NOTE — Progress Notes (Signed)
Subjective:    Becky Torres is being seen today for her first obstetrical visit.  This is not a planned pregnancy. She is at [redacted]w[redacted]d gestation. Her obstetrical history is significant for late prenatal care. Relationship with FOB: significant other, living together. Patient does intend to breast feed. Pregnancy history fully reviewed.  The information documented in the HPI was reviewed and verified.  Menstrual History: OB History    Gravida Para Term Preterm AB Living   4 2 2  0 1 2   SAB TAB Ectopic Multiple Live Births   1 0 0 0 2       Patient's last menstrual period was 04/13/2016.    Past Medical History:  Diagnosis Date  . Anemia   . Chlamydia   . Hypertension    chronic, diagnosed at age 39, takes Labetalol  . Sickle cell trait (HCC)     History reviewed. No pertinent surgical history.   (Not in a hospital admission) No Known Allergies  Social History  Substance Use Topics  . Smoking status: Never Smoker  . Smokeless tobacco: Never Used  . Alcohol use No    Family History  Problem Relation Age of Onset  . Hypertension Mother   . Hypertension Brother   . Hypertension Maternal Aunt   . Hypertension Maternal Grandmother      Review of Systems Constitutional: negative for weight loss Gastrointestinal: negative for vomiting Genitourinary:negative for genital lesions and vaginal discharge and dysuria Musculoskeletal:negative for back pain Behavioral/Psych: negative for abusive relationship, depression, illegal drug usage and tobacco use    Objective:    BP 132/90   Pulse 99   Wt 152 lb (68.9 kg)   LMP 04/13/2016   BMI 26.09 kg/m  General Appearance:    Alert, cooperative, no distress, appears stated age  Head:    Normocephalic, without obvious abnormality, atraumatic  Eyes:    PERRL, conjunctiva/corneas clear, EOM's intact, fundi    benign, both eyes  Ears:    Normal TM's and external ear canals, both ears  Nose:   Nares normal, septum midline, mucosa  normal, no drainage    or sinus tenderness  Throat:   Lips, mucosa, and tongue normal; teeth and gums normal  Neck:   Supple, symmetrical, trachea midline, no adenopathy;    thyroid:  no enlargement/tenderness/nodules; no carotid   bruit or JVD  Back:     Symmetric, no curvature, ROM normal, no CVA tenderness  Lungs:     Clear to auscultation bilaterally, respirations unlabored  Chest Wall:    No tenderness or deformity   Heart:    Regular rate and rhythm, S1 and S2 normal, no murmur, rub   or gallop  Breast Exam:    No tenderness, masses, or nipple abnormality  Abdomen:     Soft, non-tender, bowel sounds active all four quadrants,    no masses, no organomegaly  Genitalia:    Normal female without lesion, discharge or tenderness  Extremities:   Extremities normal, atraumatic, no cyanosis or edema  Pulses:   2+ and symmetric all extremities  Skin:   Skin color, texture, turgor normal, no rashes or lesions  Lymph nodes:   Cervical, supraclavicular, and axillary nodes normal  Neurologic:   CNII-XII intact, normal strength, sensation and reflexes    throughout      Lab Review Urine pregnancy test Labs reviewed yes Radiologic studies reviewed no  Assessment:    Pregnancy at [redacted]w[redacted]d weeks    Late Prenatal Care  Mild Hypertension   Plan:     1. Supervision of other normal pregnancy, antepartum Rx: - US MFM OB COMP + 14 WK; Future - Hemoglobinopathy evaluation - Hemoglobin A1c - Vitamin D (25 hydroxy) - Obstetric Panel, Including HIV - Culture, OB Urine - Cytology - PAP  2. Late prenatal care affecting pregnancy, antepartum, second trimester   3. Mild hypertension - follow up BP in 1 week  Supervision of other normal pregnancy, antepartum - Plan: US MFM OB COMP + 14 WK, Hemoglobinopathy evaluation, Hemoglobin A1c, Vitamin D (25 hydroxy), Obstetric Panel, Including HIV, Culture, OB Urine, Cytology - PAP  Late prenatal care affecting pregnancy, antepartum, second  trimester  Mild hypertension  Prenatal vitamins.  Counseling provided regarding continued use of seat belts, cessation of alcohol consumption, smoking or use of illicit drugs; infection precautions i.e., influenza/TDAP immunizations, toxoplasmosis,CMV, parvovirus, listeria and varicella; workplace safety, exercise during pregnancy; routine dental care, safe medications, sexual activity, hot tubs, saunas, pools, travel, caffeine use, fish and methlymercury, potential toxins, hair treatments, varicose veins Weight gain recommendations per IOM guidelines reviewed: underweight/BMI< 18.5--> gain 28 - 40 lbs; normal weight/BMI 18.5 - 24.9--> gain 25 - 35 lbs; overweight/BMI 25 - 29.9--> gain 15 - 25 lbs; obese/BMI >30->gain  11 - 20 lbs Problem list reviewed and updated. FIRST/CF mutation testing/NIPT/QUAD SCREEN/fragile X/Ashkenazi Jewish population testing/Spinal muscular atrophy discussed: too late. Role of ultrasound in pregnancy discussed; fetal survey: requested. Amniocentesis discussed: declined.   Orders Placed This Encounter  Procedures  . Culture, OB Urine  . US MFM OB COMP + 14 WK    Standing Status:   Future    Standing Expiration Date:   11/30/2017    Order Specific Question:   Reason for Exam (SYMPTOM  OR DIAGNOSIS REQUIRED)    Answer:   Anatomic Survey    Order Specific Question:   Preferred imaging location?    Answer:   [facility]-Ultrasound  . Hemoglobinopathy evaluation  . Hemoglobin A1c  . Vitamin D (25 hydroxy)  . Obstetric Panel, Including HIV    Follow up in 1 weeks. 50% of 20 min visit spent on counseling and coordination of care. Patient ID: Natale MilchKalisa P Armon, female   DOB: 08/03/1990, 26 y.o.   MRN: [id number]

## 2016-10-02 ENCOUNTER — Other Ambulatory Visit: Payer: Self-pay | Admitting: Obstetrics

## 2016-10-02 DIAGNOSIS — E559 Vitamin D deficiency, unspecified: Secondary | ICD-10-CM

## 2016-10-02 LAB — OBSTETRIC PANEL, INCLUDING HIV
Antibody Screen: NEGATIVE
BASOS ABS: 0 10*3/uL (ref 0.0–0.2)
Basos: 0 %
EOS (ABSOLUTE): 0.1 10*3/uL (ref 0.0–0.4)
Eos: 1 %
HEP B S AG: NEGATIVE
HIV SCREEN 4TH GENERATION: NONREACTIVE
Hematocrit: 33.9 % — ABNORMAL LOW (ref 34.0–46.6)
Hemoglobin: 10.9 g/dL — ABNORMAL LOW (ref 11.1–15.9)
Immature Grans (Abs): 0.1 10*3/uL (ref 0.0–0.1)
Immature Granulocytes: 1 %
LYMPHS ABS: 1.4 10*3/uL (ref 0.7–3.1)
Lymphs: 13 %
MCH: 24.7 pg — AB (ref 26.6–33.0)
MCHC: 32.2 g/dL (ref 31.5–35.7)
MCV: 77 fL — AB (ref 79–97)
MONOS ABS: 0.9 10*3/uL (ref 0.1–0.9)
Monocytes: 9 %
NEUTROS ABS: 8.2 10*3/uL — AB (ref 1.4–7.0)
Neutrophils: 76 %
PLATELETS: 239 10*3/uL (ref 150–379)
RBC: 4.42 x10E6/uL (ref 3.77–5.28)
RDW: 15.3 % (ref 12.3–15.4)
RPR: NONREACTIVE
Rh Factor: POSITIVE
Rubella Antibodies, IGG: 21.4 index (ref >0.99)
WBC: 10.7 10*3/uL (ref 3.4–10.8)

## 2016-10-02 LAB — HEMOGLOBIN A1C
Est. average glucose Bld gHb Est-mCnc: 94 mg/dL
Hgb A1c MFr Bld: 4.9 % (ref 4.8–5.6)

## 2016-10-02 LAB — HEMOGLOBINOPATHY EVALUATION
HEMOGLOBIN A2 QUANTITATION: 3.9 % — AB (ref 1.8–3.2)
HGB C: 0 %
HGB S: 36.1 % — AB
HGB VARIANT: 0 %
Hemoglobin F Quantitation: 0 % (ref 0.0–2.0)
Hgb A: 60 % — ABNORMAL LOW (ref 96.4–98.8)

## 2016-10-02 LAB — VITAMIN D 25 HYDROXY (VIT D DEFICIENCY, FRACTURES): Vit D, 25-Hydroxy: 17.8 ng/mL — ABNORMAL LOW (ref 30.0–100.0)

## 2016-10-02 MED ORDER — VITAMIN D 50 MCG (2000 UT) PO TABS: 2000.0000 [IU] | ORAL_TABLET | Freq: Every day | ORAL | 5 refills | Status: DC

## 2016-10-05 ENCOUNTER — Encounter (HOSPITAL_COMMUNITY): Payer: Self-pay | Admitting: Obstetrics

## 2016-10-05 LAB — CYTOLOGY - PAP
Diagnosis: NEGATIVE
HPV: NOT DETECTED

## 2016-10-06 ENCOUNTER — Other Ambulatory Visit: Payer: Self-pay | Admitting: Obstetrics

## 2016-10-06 DIAGNOSIS — N3000 Acute cystitis without hematuria: Secondary | ICD-10-CM

## 2016-10-06 LAB — URINE CULTURE, OB REFLEX

## 2016-10-06 LAB — CULTURE, OB URINE

## 2016-10-06 MED ORDER — NITROFURANTOIN MONOHYD MACRO 100 MG PO CAPS: 100.0000 mg | ORAL_CAPSULE | Freq: Two times a day (BID) | ORAL | 0 refills | Status: DC

## 2016-10-07 ENCOUNTER — Ambulatory Visit: Payer: Medicaid Other

## 2016-10-07 VITALS — BP 124/85

## 2016-10-07 DIAGNOSIS — Z0130 Encounter for examination of blood pressure without abnormal findings: Secondary | ICD-10-CM

## 2016-10-07 NOTE — Progress Notes (Signed)
Nurse visit for BP check. BP WNL today.

## 2016-10-11 ENCOUNTER — Ambulatory Visit (HOSPITAL_COMMUNITY)
Admission: RE | Admit: 2016-10-11 | Discharge: 2016-10-11 | Disposition: A | Discharge status: Home / Self Care | Payer: Medicaid Other | Source: Ambulatory Visit | Attending: Obstetrics | Admitting: Obstetrics

## 2016-10-11 ENCOUNTER — Other Ambulatory Visit: Payer: Self-pay | Admitting: Obstetrics

## 2016-10-11 DIAGNOSIS — O0932 Supervision of pregnancy with insufficient antenatal care, second trimester: Secondary | ICD-10-CM

## 2016-10-11 DIAGNOSIS — Z363 Encounter for antenatal screening for malformations: Secondary | ICD-10-CM

## 2016-10-11 DIAGNOSIS — Z3A26 26 weeks gestation of pregnancy: Secondary | ICD-10-CM | POA: Diagnosis not present

## 2016-10-11 DIAGNOSIS — O10012 Pre-existing essential hypertension complicating pregnancy, second trimester: Secondary | ICD-10-CM | POA: Diagnosis not present

## 2016-10-11 DIAGNOSIS — Z3480 Encounter for supervision of other normal pregnancy, unspecified trimester: Secondary | ICD-10-CM

## 2016-10-22 ENCOUNTER — Encounter: Payer: Medicaid Other | Admitting: Obstetrics

## 2016-10-22 ENCOUNTER — Other Ambulatory Visit: Payer: Medicaid Other

## 2016-11-16 ENCOUNTER — Other Ambulatory Visit: Payer: Medicaid Other

## 2016-11-16 DIAGNOSIS — Z3480 Encounter for supervision of other normal pregnancy, unspecified trimester: Secondary | ICD-10-CM

## 2016-11-17 LAB — GLUCOSE TOLERANCE, 2 HOURS W/ 1HR
GLUCOSE, 2 HOUR: 84 mg/dL (ref 65–152)
GLUCOSE, FASTING: 76 mg/dL (ref 65–91)
Glucose, 1 hour: 116 mg/dL (ref 65–179)

## 2016-11-17 LAB — CBC
Hematocrit: 29.6 % — ABNORMAL LOW (ref 34.0–46.6)
Hemoglobin: 9.2 g/dL — ABNORMAL LOW (ref 11.1–15.9)
MCH: 21.7 pg — AB (ref 26.6–33.0)
MCHC: 31.1 g/dL — AB (ref 31.5–35.7)
MCV: 70 fL — ABNORMAL LOW (ref 79–97)
PLATELETS: 188 10*3/uL (ref 150–379)
RBC: 4.23 x10E6/uL (ref 3.77–5.28)
RDW: 16.1 % — ABNORMAL HIGH (ref 12.3–15.4)
WBC: 10.7 10*3/uL (ref 3.4–10.8)

## 2016-11-17 LAB — HIV ANTIBODY (ROUTINE TESTING W REFLEX): HIV SCREEN 4TH GENERATION: NONREACTIVE

## 2016-11-17 LAB — RPR: RPR: NONREACTIVE

## 2016-11-19 ENCOUNTER — Other Ambulatory Visit: Payer: Self-pay | Admitting: *Deleted

## 2016-11-19 DIAGNOSIS — O99013 Anemia complicating pregnancy, third trimester: Secondary | ICD-10-CM

## 2016-11-19 MED ORDER — CITRANATAL BLOOM 90-1 MG PO TABS: 1.0000 | ORAL_TABLET | Freq: Every day | ORAL | 12 refills | Status: DC

## 2016-11-19 NOTE — Progress Notes (Signed)
Patient wants different prenatal.

## 2016-11-23 ENCOUNTER — Encounter: Payer: Medicaid Other | Admitting: Obstetrics

## 2016-12-01 ENCOUNTER — Ambulatory Visit (INDEPENDENT_AMBULATORY_CARE_PROVIDER_SITE_OTHER): Payer: Medicaid Other | Admitting: Obstetrics

## 2016-12-01 VITALS — BP 131/87 | HR 77 | Wt 163.6 lb

## 2016-12-01 DIAGNOSIS — O0933 Supervision of pregnancy with insufficient antenatal care, third trimester: Secondary | ICD-10-CM

## 2016-12-01 DIAGNOSIS — Z3483 Encounter for supervision of other normal pregnancy, third trimester: Secondary | ICD-10-CM

## 2016-12-01 DIAGNOSIS — I10 Essential (primary) hypertension: Secondary | ICD-10-CM

## 2016-12-01 DIAGNOSIS — Z3480 Encounter for supervision of other normal pregnancy, unspecified trimester: Secondary | ICD-10-CM

## 2016-12-01 DIAGNOSIS — O0932 Supervision of pregnancy with insufficient antenatal care, second trimester: Secondary | ICD-10-CM

## 2016-12-01 NOTE — Progress Notes (Signed)
Patient does have a lot of pressure.

## 2016-12-02 ENCOUNTER — Encounter: Payer: Self-pay | Admitting: Obstetrics

## 2016-12-02 NOTE — Progress Notes (Signed)
Subjective:  Becky Torres is a 26 y.o. [id number] at 1891w1d being seen today for ongoing prenatal care.  She is currently monitored for the following issues for this low-risk pregnancy and has Short cervix affecting pregnancy; Preterm uterine contractions, antepartum; Supervision of other normal pregnancy; Chlamydia trachomatis infection in pregnancy; Hypertension; Hypertension in pregnancy, antepartum; Late prenatal care; and Supervision of other normal pregnancy, antepartum on her problem list.  Patient reports no complaints.  Contractions: Irregular. Vag. Bleeding: None.  Movement: Present. Denies leaking of fluid.   The following portions of the patient's history were reviewed and updated as appropriate: allergies, current medications, past family history, past medical history, past social history, past surgical history and problem list. Problem list updated.  Objective:   Vitals:   12/01/16 1525  BP: 131/87  Pulse: 77  Weight: 163 lb 9.6 oz (74.2 kg)    Fetal Status: Fetal Heart Rate (bpm): 140   Movement: Present     General:  Alert, oriented and cooperative. Patient is in no acute distress.  Skin: Skin is warm and dry. No rash noted.   Cardiovascular: Normal heart rate noted  Respiratory: Normal respiratory effort, no problems with respiration noted  Abdomen: Soft, gravid, appropriate for gestational age. Pain/Pressure: Present     Pelvic:  Cervical exam deferred        Extremities: Normal range of motion.  Edema: None  Mental Status: Normal mood and affect. Normal behavior. Normal judgment and thought content.   Urinalysis:      Assessment and Plan:  Pregnancy: [id number] at 7891w1d  1. Supervision of other normal pregnancy, antepartum   2. Late prenatal care affecting pregnancy, antepartum, second trimester   3. Mild hypertension - stable, on Labetalol   Preterm labor symptoms and general obstetric precautions including but not limited to vaginal bleeding, contractions,  leaking of fluid and fetal movement were reviewed in detail with the patient. Please refer to After Visit Summary for other counseling recommendations.  Return in about 1 week (around 12/08/2016) for ROB.   Becky Torres, Charles A, MD

## 2016-12-15 ENCOUNTER — Encounter: Payer: Medicaid Other | Admitting: Obstetrics & Gynecology

## 2016-12-29 ENCOUNTER — Inpatient Hospital Stay (HOSPITAL_COMMUNITY)
Admission: AD | Admit: 2016-12-29 | Discharge: 2016-12-29 | Disposition: A | Discharge status: Home / Self Care | Payer: Medicaid Other | Source: Ambulatory Visit | Attending: Obstetrics and Gynecology | Admitting: Obstetrics and Gynecology

## 2016-12-29 DIAGNOSIS — Z3A38 38 weeks gestation of pregnancy: Secondary | ICD-10-CM | POA: Diagnosis not present

## 2016-12-29 DIAGNOSIS — Z3483 Encounter for supervision of other normal pregnancy, third trimester: Secondary | ICD-10-CM | POA: Insufficient documentation

## 2016-12-29 DIAGNOSIS — O479 False labor, unspecified: Secondary | ICD-10-CM

## 2016-12-29 NOTE — MAU Note (Signed)
..  I have communicated with Dr. Frances FurbishWinfrey and reviewed vital signs: There were no vitals filed for this visit.  Vaginal exam:  Dilation: 3 Effacement (%): 80 Cervical Position: Middle Station: -1 Presentation: Vertex Exam by:: Avari Nevares, RN ,   Also reviewed contraction pattern and that non-stress test is reactive.  It has been documented that patient is contracting irregular with no cervical change over 1.5 hours not indicating active labor.  Patient denies any other complaints.  Based on this report provider has given order for discharge.  A discharge order and diagnosis entered by a provider.   Labor discharge instructions reviewed with patient.

## 2016-12-29 NOTE — Discharge Instructions (Signed)

## 2016-12-31 ENCOUNTER — Inpatient Hospital Stay (HOSPITAL_COMMUNITY): Payer: Medicaid Other | Admitting: Anesthesiology

## 2016-12-31 ENCOUNTER — Encounter (HOSPITAL_COMMUNITY): Payer: Self-pay | Admitting: *Deleted

## 2016-12-31 ENCOUNTER — Inpatient Hospital Stay (HOSPITAL_COMMUNITY)
Admission: AD | Admit: 2016-12-31 | Discharge: 2017-01-01 | DRG: 774 | Disposition: A | Discharge status: Home / Self Care | Payer: Medicaid Other | Source: Ambulatory Visit | Attending: Obstetrics & Gynecology | Admitting: Obstetrics & Gynecology

## 2016-12-31 DIAGNOSIS — O9902 Anemia complicating childbirth: Secondary | ICD-10-CM | POA: Diagnosis present

## 2016-12-31 DIAGNOSIS — O10019 Pre-existing essential hypertension complicating pregnancy, unspecified trimester: Secondary | ICD-10-CM

## 2016-12-31 DIAGNOSIS — Z3A38 38 weeks gestation of pregnancy: Secondary | ICD-10-CM

## 2016-12-31 DIAGNOSIS — D573 Sickle-cell trait: Secondary | ICD-10-CM | POA: Diagnosis present

## 2016-12-31 DIAGNOSIS — O1002 Pre-existing essential hypertension complicating childbirth: Principal | ICD-10-CM | POA: Diagnosis present

## 2016-12-31 LAB — TYPE AND SCREEN
ABO/RH(D): B POS
ANTIBODY SCREEN: NEGATIVE

## 2016-12-31 LAB — COMPREHENSIVE METABOLIC PANEL
ALBUMIN: 3 g/dL — AB (ref 3.5–5.0)
ALK PHOS: 124 U/L (ref 38–126)
ALT: 14 U/L (ref 14–54)
ANION GAP: 8 (ref 5–15)
AST: 19 U/L (ref 15–41)
BUN: 12 mg/dL (ref 6–20)
CALCIUM: 8.6 mg/dL — AB (ref 8.9–10.3)
CHLORIDE: 102 mmol/L (ref 101–111)
CO2: 22 mmol/L (ref 22–32)
CREATININE: 0.78 mg/dL (ref 0.44–1.00)
GFR calc Af Amer: >60 mL/min (ref >60)
GFR calc non Af Amer: >60 mL/min (ref >60)
GLUCOSE: 88 mg/dL (ref 65–99)
Potassium: 4 mmol/L (ref 3.5–5.1)
SODIUM: 132 mmol/L — AB (ref 135–145)
Total Bilirubin: 0.3 mg/dL (ref 0.3–1.2)
Total Protein: 7 g/dL (ref 6.5–8.1)

## 2016-12-31 LAB — RPR: RPR: NONREACTIVE

## 2016-12-31 LAB — CBC
HEMATOCRIT: 31.1 % — AB (ref 36.0–46.0)
HEMOGLOBIN: 9.8 g/dL — AB (ref 12.0–15.0)
MCH: 21.8 pg — ABNORMAL LOW (ref 26.0–34.0)
MCHC: 31.5 g/dL (ref 30.0–36.0)
MCV: 69.1 fL — ABNORMAL LOW (ref 78.0–100.0)
Platelets: 178 10*3/uL (ref 150–400)
RBC: 4.5 MIL/uL (ref 3.87–5.11)
RDW: 19 % — ABNORMAL HIGH (ref 11.5–15.5)
WBC: 9 10*3/uL (ref 4.0–10.5)

## 2016-12-31 LAB — RAPID URINE DRUG SCREEN, HOSP PERFORMED
Amphetamines: NOT DETECTED
BARBITURATES: NOT DETECTED
Benzodiazepines: NOT DETECTED
Cocaine: NOT DETECTED
OPIATES: NOT DETECTED
TETRAHYDROCANNABINOL: NOT DETECTED

## 2016-12-31 LAB — GROUP B STREP BY PCR: GROUP B STREP BY PCR: NEGATIVE

## 2016-12-31 LAB — PROTEIN / CREATININE RATIO, URINE
Creatinine, Urine: 66 mg/dL
PROTEIN CREATININE RATIO: 0.17 mg/mg{creat} — AB (ref 0.00–0.15)
Total Protein, Urine: 11 mg/dL

## 2016-12-31 MED ORDER — ONDANSETRON HCL 4 MG/2ML IJ SOLN
4.0000 mg | Freq: Four times a day (QID) | INTRAMUSCULAR | Status: DC | PRN
  Administered 2016-12-31: 4 mg via INTRAVENOUS
  Filled 2016-12-31: qty 2

## 2016-12-31 MED ORDER — LACTATED RINGERS IV SOLN: 500.0000 mL | INTRAVENOUS | Status: DC | PRN

## 2016-12-31 MED ORDER — TETANUS-DIPHTH-ACELL PERTUSSIS 5-2.5-18.5 LF-MCG/0.5 IM SUSP
0.5000 mL | Freq: Once | INTRAMUSCULAR | Status: AC
  Administered 2017-01-01: 0.5 mL via INTRAMUSCULAR
  Filled 2016-12-31: qty 0.5

## 2016-12-31 MED ORDER — ONDANSETRON HCL 4 MG/2ML IJ SOLN: 4.0000 mg | INTRAMUSCULAR | Status: DC | PRN

## 2016-12-31 MED ORDER — COCONUT OIL OIL: 1.0000 "application " | TOPICAL_OIL | Status: DC | PRN

## 2016-12-31 MED ORDER — LIDOCAINE HCL (PF) 1 % IJ SOLN
30.0000 mL | INTRAMUSCULAR | Status: DC | PRN
  Filled 2016-12-31: qty 30

## 2016-12-31 MED ORDER — OXYCODONE-ACETAMINOPHEN 5-325 MG PO TABS: 2.0000 | ORAL_TABLET | ORAL | Status: DC | PRN

## 2016-12-31 MED ORDER — SOD CITRATE-CITRIC ACID 500-334 MG/5ML PO SOLN: 30.0000 mL | ORAL | Status: DC | PRN

## 2016-12-31 MED ORDER — DIPHENHYDRAMINE HCL 25 MG PO CAPS: 25.0000 mg | ORAL_CAPSULE | Freq: Four times a day (QID) | ORAL | Status: DC | PRN

## 2016-12-31 MED ORDER — PHENYLEPHRINE 40 MCG/ML (10ML) SYRINGE FOR IV PUSH (FOR BLOOD PRESSURE SUPPORT)
80.0000 ug | PREFILLED_SYRINGE | INTRAVENOUS | Status: DC | PRN
  Filled 2016-12-31: qty 5

## 2016-12-31 MED ORDER — TERBUTALINE SULFATE 1 MG/ML IJ SOLN
0.2500 mg | Freq: Once | INTRAMUSCULAR | Status: DC | PRN
Start: 2016-12-31 — End: 2016-12-31
  Filled 2016-12-31: qty 1

## 2016-12-31 MED ORDER — OXYTOCIN 40 UNITS IN LACTATED RINGERS INFUSION - SIMPLE MED
2.5000 [IU]/h | INTRAVENOUS | Status: DC
  Filled 2016-12-31 (×2): qty 1000

## 2016-12-31 MED ORDER — PENICILLIN G POT IN DEXTROSE 60000 UNIT/ML IV SOLN: 3.0000 10*6.[IU] | INTRAVENOUS | Status: DC

## 2016-12-31 MED ORDER — SIMETHICONE 80 MG PO CHEW: 80.0000 mg | CHEWABLE_TABLET | ORAL | Status: DC | PRN

## 2016-12-31 MED ORDER — DIPHENHYDRAMINE HCL 50 MG/ML IJ SOLN: 12.5000 mg | INTRAMUSCULAR | Status: DC | PRN

## 2016-12-31 MED ORDER — EPHEDRINE 5 MG/ML INJ
10.0000 mg | INTRAVENOUS | Status: DC | PRN
  Filled 2016-12-31: qty 2

## 2016-12-31 MED ORDER — OXYTOCIN 40 UNITS IN LACTATED RINGERS INFUSION - SIMPLE MED
1.0000 m[IU]/min | INTRAVENOUS | Status: DC
  Administered 2016-12-31: 4 m[IU]/min via INTRAVENOUS
  Administered 2016-12-31: 2 m[IU]/min via INTRAVENOUS

## 2016-12-31 MED ORDER — ACETAMINOPHEN 325 MG PO TABS: 650.0000 mg | ORAL_TABLET | ORAL | Status: DC | PRN

## 2016-12-31 MED ORDER — LABETALOL HCL 100 MG PO TABS
100.0000 mg | ORAL_TABLET | Freq: Two times a day (BID) | ORAL | Status: DC
  Administered 2016-12-31 (×2): 100 mg via ORAL
  Filled 2016-12-31: qty 1

## 2016-12-31 MED ORDER — LACTATED RINGERS IV SOLN
500.0000 mL | Freq: Once | INTRAVENOUS | Status: AC
  Administered 2016-12-31: 500 mL via INTRAVENOUS

## 2016-12-31 MED ORDER — SENNOSIDES-DOCUSATE SODIUM 8.6-50 MG PO TABS
2.0000 | ORAL_TABLET | ORAL | Status: DC
  Administered 2016-12-31: 2 via ORAL
  Filled 2016-12-31: qty 2

## 2016-12-31 MED ORDER — WITCH HAZEL-GLYCERIN EX PADS: 1.0000 "application " | MEDICATED_PAD | CUTANEOUS | Status: DC | PRN

## 2016-12-31 MED ORDER — DIBUCAINE 1 % RE OINT: 1.0000 "application " | TOPICAL_OINTMENT | RECTAL | Status: DC | PRN

## 2016-12-31 MED ORDER — TERBUTALINE SULFATE 1 MG/ML IJ SOLN
0.2500 mg | Freq: Once | INTRAMUSCULAR | Status: DC | PRN
  Filled 2016-12-31: qty 1

## 2016-12-31 MED ORDER — AMLODIPINE BESYLATE 5 MG PO TABS
5.0000 mg | ORAL_TABLET | Freq: Every day | ORAL | Status: DC
  Administered 2016-12-31 – 2017-01-01 (×2): 5 mg via ORAL
  Filled 2016-12-31 (×3): qty 1

## 2016-12-31 MED ORDER — FENTANYL 2.5 MCG/ML BUPIVACAINE 1/10 % EPIDURAL INFUSION (WH - ANES)
14.0000 mL/h | INTRAMUSCULAR | Status: DC | PRN
  Filled 2016-12-31: qty 100

## 2016-12-31 MED ORDER — ONDANSETRON HCL 4 MG PO TABS: 4.0000 mg | ORAL_TABLET | ORAL | Status: DC | PRN

## 2016-12-31 MED ORDER — FLEET ENEMA 7-19 GM/118ML RE ENEM: 1.0000 | ENEMA | RECTAL | Status: DC | PRN

## 2016-12-31 MED ORDER — PRENATAL MULTIVITAMIN CH
1.0000 | ORAL_TABLET | Freq: Every day | ORAL | Status: DC
  Administered 2017-01-01: 1 via ORAL
  Filled 2016-12-31: qty 1

## 2016-12-31 MED ORDER — BENZOCAINE-MENTHOL 20-0.5 % EX AERO: 1.0000 "application " | INHALATION_SPRAY | CUTANEOUS | Status: DC | PRN

## 2016-12-31 MED ORDER — OXYTOCIN BOLUS FROM INFUSION
500.0000 mL | Freq: Once | INTRAVENOUS | Status: AC
  Administered 2016-12-31: 500 mL via INTRAVENOUS

## 2016-12-31 MED ORDER — IBUPROFEN 600 MG PO TABS
600.0000 mg | ORAL_TABLET | Freq: Four times a day (QID) | ORAL | Status: DC
  Administered 2016-12-31 – 2017-01-01 (×5): 600 mg via ORAL
  Filled 2016-12-31 (×5): qty 1

## 2016-12-31 MED ORDER — LACTATED RINGERS IV SOLN
INTRAVENOUS | Status: DC
  Administered 2016-12-31 (×3): via INTRAVENOUS

## 2016-12-31 MED ORDER — PHENYLEPHRINE 40 MCG/ML (10ML) SYRINGE FOR IV PUSH (FOR BLOOD PRESSURE SUPPORT)
80.0000 ug | PREFILLED_SYRINGE | INTRAVENOUS | Status: DC | PRN
  Filled 2016-12-31: qty 5
  Filled 2016-12-31: qty 10

## 2016-12-31 MED ORDER — OXYCODONE-ACETAMINOPHEN 5-325 MG PO TABS
1.0000 | ORAL_TABLET | ORAL | Status: DC | PRN
Start: 2016-12-31 — End: 2016-12-31

## 2016-12-31 MED ORDER — LABETALOL HCL 5 MG/ML IV SOLN: 20.0000 mg | INTRAVENOUS | Status: DC | PRN

## 2016-12-31 MED ORDER — HYDRALAZINE HCL 20 MG/ML IJ SOLN
5.0000 mg | INTRAMUSCULAR | Status: DC | PRN
  Administered 2016-12-31: 5 mg via INTRAVENOUS
  Filled 2016-12-31: qty 1

## 2016-12-31 MED ORDER — PENICILLIN G POTASSIUM 5000000 UNITS IJ SOLR: 5.0000 10*6.[IU] | Freq: Once | INTRAVENOUS | Status: DC

## 2016-12-31 MED ORDER — ZOLPIDEM TARTRATE 5 MG PO TABS: 5.0000 mg | ORAL_TABLET | Freq: Every evening | ORAL | Status: DC | PRN

## 2016-12-31 MED ORDER — FENTANYL CITRATE (PF) 100 MCG/2ML IJ SOLN: 50.0000 ug | INTRAMUSCULAR | Status: DC | PRN

## 2016-12-31 NOTE — H&P (Signed)
LABOR AND DELIVERY ADMISSION HISTORY AND PHYSICAL NOTE  Becky Torres is a 26 y.o. female [phone] with IUP at [redacted]w[redacted]d by 2nd trimester [place] presenting for SOL.  She endorses contractions since Wednesday that have progressively gotten stronger.  She reports positive fetal movement. She denies leakage of fluid or vaginal bleeding.  She states she has been diagnosed with HTN at age 10 and has a strong FH of HTN. She is on labetalol. Denies blurred vision, headache, RUQ pain.  Prenatal History/Complications:  Past Medical History: Past Medical History:  Diagnosis Date  . Anemia   . Chlamydia   . Hypertension    chronic, diagnosed at age 28, takes Labetalol  . Sickle cell trait (HCC)     Past Surgical History: History reviewed. No pertinent surgical history.  Obstetrical History: OB History    Gravida Para Term Preterm AB Living   4 2 2  0 1 2   SAB TAB Ectopic Multiple Live Births   1 0 0 0 2      Social History: Social History   Social History  . Marital status: Single    Spouse name: N/A  . Number of children: N/A  . Years of education: N/A   Social History Main Topics  . Smoking status: Never Smoker  . Smokeless tobacco: Never Used  . Alcohol use No  . Drug use: No  . Sexual activity: Yes    Birth control/ protection: None   Other Topics Concern  . None   Social History Narrative  . None    Family History: Family History  Problem Relation Age of Onset  . Hypertension Mother   . Hypertension Brother   . Hypertension Maternal Aunt   . Hypertension Maternal Grandmother     Allergies: No Known Allergies  Prescriptions Prior to Admission  Medication Sig Dispense Refill Last Dose  . labetalol (NORMODYNE) 100 MG tablet Take 100 mg by mouth 2 (two) times daily.   12/30/2016 at 2100  . Prenatal MV-Min-FA-Omega-3 (PRENATAL GUMMIES/DHA & FA) 0.4-32.5 MG CHEW Chew 1 capsule by mouth 2 (two) times daily.   12/30/2016 at Unknown time  . Cholecalciferol (VITAMIN  D) 2000 units tablet Take 1 tablet (2,000 Units total) by mouth daily. 30 tablet 5 Taking  . Prenatal-DSS-FeCb-FeGl-FA (CITRANATAL BLOOM) 90-1 MG TABS Take 1 tablet by mouth daily. 30 tablet 12 Taking  . promethazine (PHENERGAN) 25 MG tablet Take 1 tablet (25 mg total) by mouth every 6 (six) hours as needed for nausea or vomiting. (Patient not taking: Reported on 09/30/2016) 30 tablet 1 Not Taking     Review of Systems   All systems reviewed and negative except as stated in HPI  Blood pressure (!) 135/99, pulse 92, temperature 98.5 F (36.9 C), temperature source Oral, resp. rate 20, height 5\' 4"  (1.626 m), weight 77.2 kg (170 lb 4 oz), last menstrual period 04/13/2016, unknown if currently breastfeeding. General appearance: alert, cooperative and no distress Lungs: no respiratory distress Heart: regular rate Abdomen: soft, non-tender Extremities: No calf swelling or tenderness Presentation: cephalic by bedside US Fetal monitoring: baseline 135, moderate variability, accelerations present, no decelerations Uterine activity: every 5-6 minutes Dilation: 4.5 Effacement (%): 80 Exam by:: dr Wonda Olds verified presentation by bedside u/s   Prenatal labs: ABO, Rh: B/Positive/-- (05/31 1645) Antibody: Negative (05/31 1645) Rubella: immune RPR: Non Reactive (07/17 1100)  HBsAg: Negative (05/31 1645)  HIV:   NR GBS:    None collected Genetic screening:  Too late to care Anatomy  US: normal 26 week US  Prenatal Transfer Tool  Maternal Diabetes: No Genetic Screening: did not get Maternal Ultrasounds/Referrals: Normal Fetal Ultrasounds or other Referrals:  None Maternal Substance Abuse:  No Significant Maternal Medications:  Meds include: Other:  labetalol 100 mg BID Significant Maternal Lab Results: None  No results found for this or any previous visit (from the past 24 hour(s)).  Patient Active Problem List   Diagnosis Date Noted  . Supervision of other normal pregnancy, antepartum  09/30/2016  . Late prenatal care 09/19/2012  . Supervision of other normal pregnancy 07/24/2012  . Chlamydia trachomatis infection in pregnancy 07/24/2012  . Hypertension 07/24/2012  . Hypertension in pregnancy, antepartum 07/24/2012  . Short cervix affecting pregnancy 07/01/2012  . Preterm uterine contractions, antepartum 07/01/2012    Assessment: Becky Torres is a 26 y.o. [id number] at 4043w2d here for SOL. She has pre-existing HTN on labetalol 100 mg BID and late prenatal care (26 weeks) and limited PNC (2 OB visits) complicating pregnancy.  #Labor: SOL will augment as needed #Pain: Desires epidural #FWB: Cat 1 tracing #ID:  GBS unknown #MOF: bottle #MOC: depo #Circ:  N/a  Obtain UDS due to limited/[facility] GBS PCR ordered   Tillman SersAngela C Riccio, DO PGY-2 8/31/20183:27 AM   I have seen and examined this patient and agree with the management plan.

## 2016-12-31 NOTE — Anesthesia Postprocedure Evaluation (Signed)
Anesthesia Post Note  Patient: Becky Torres  Procedure(s) Performed: * No procedures listed *     Patient location during evaluation: Mother Baby Anesthesia Type: Epidural Level of consciousness: awake and alert Pain management: pain level controlled Vital Signs Assessment: post-procedure vital signs reviewed and stable Respiratory status: spontaneous breathing, nonlabored ventilation and respiratory function stable Cardiovascular status: stable Postop Assessment: no headache, no backache and epidural receding Anesthetic complications: no    Last Vitals:  Vitals:   12/31/16 0831 12/31/16 0901  BP: (!) 145/94 124/85  Pulse: (!) 56 76  Resp: 16   Temp:  36.6 C  SpO2:      Last Pain:  Vitals:   12/31/16 0901  TempSrc: Oral  PainSc:    Pain Goal:                 Retina Bernardy

## 2016-12-31 NOTE — Anesthesia Preprocedure Evaluation (Signed)
Anesthesia Evaluation  Patient identified by MRN, date of birth, ID band Patient awake    Reviewed: Allergy & Precautions, NPO status , Patient's Chart, lab work & pertinent test results  Airway Mallampati: II  TM Distance: >3 FB Neck ROM: Full    Dental no notable dental hx. (+) Dental Advisory Given   Pulmonary neg pulmonary ROS,    Pulmonary exam normal        Cardiovascular hypertension, Normal cardiovascular exam     Neuro/Psych negative neurological ROS  negative psych ROS   GI/Hepatic negative GI ROS, Neg liver ROS,   Endo/Other  negative endocrine ROS  Renal/GU negative Renal ROS  negative genitourinary   Musculoskeletal negative musculoskeletal ROS (+)   Abdominal   Peds negative pediatric ROS (+)  Hematology negative hematology ROS (+)   Anesthesia Other Findings   Reproductive/Obstetrics (+) Pregnancy                             Anesthesia Physical Anesthesia Plan  ASA: II  Anesthesia Plan: Epidural   Post-op Pain Management:    Induction:   PONV Risk Score and Plan:   Airway Management Planned: Natural Airway  Additional Equipment:   Intra-op Plan:   Post-operative Plan:   Informed Consent: I have reviewed the patients History and Physical, chart, labs and discussed the procedure including the risks, benefits and alternatives for the proposed anesthesia with the patient or authorized representative who has indicated his/her understanding and acceptance.     Dental advisory given  Plan Discussed with: Anesthesiologist  Anesthesia Plan Comments:         Anesthesia Quick Evaluation  

## 2016-12-31 NOTE — Anesthesia Pain Management Evaluation Note (Signed)
  CRNA Pain Management Visit Note  Patient: Becky Torres, 26 y.o., female  "Hello I am a [facility] the anesthesia [facility]. We have an anesthesia team available at all times to provide care throughout the hospital, including epidural management and anesthesia for C-section. I don't know your plan for the delivery whether it a natural birth, water birth, IV sedation, nitrous supplementation, doula or epidural, but we want to meet your pain goals."   1.Was your pain managed to your expectations on prior hospitalizations?   Yes   2.What is your expectation for pain management during this hospitalization?     Epidural  3.How can we help you reach that goal?   Record the patient's initial score and the patient's pain goal.   Pain: 0  Pain Goal: 7 [facility] wants you to be able to say your pain was always managed very well.  Laban EmperorMalinova,Vicenta Olds Hristova 12/31/2016

## 2016-12-31 NOTE — Anesthesia Postprocedure Evaluation (Signed)
Anesthesia Post Note  Patient: Becky Torres  Procedure(s) Performed: * No procedures listed *     Patient location during evaluation: Mother Baby Anesthesia Type: Epidural Level of consciousness: awake and alert and oriented Pain management: satisfactory to patient Vital Signs Assessment: post-procedure vital signs reviewed and stable Respiratory status: spontaneous breathing and nonlabored ventilation Cardiovascular status: stable Postop Assessment: no headache, no backache, no signs of nausea or vomiting, adequate PO intake and patient able to bend at knees (patient up walking) Anesthetic complications: no    Last Vitals:  Vitals:   12/31/16 1330 12/31/16 1430  BP: 119/72 103/67  Pulse: 77 98  Resp: 16 16  Temp: 36.9 C 36.7 C  SpO2:      Last Pain:  Vitals:   12/31/16 1430  TempSrc: Axillary  PainSc: 0-No pain   Pain Goal:                 Hydia Copelin

## 2016-12-31 NOTE — MAU Note (Signed)
PT SAYS SHE WAS HERE ON WED   VE 3 CM.     HAVE GOTTEN STRONGER   .   DENIES  HSV AND   MRSA.  GBS- UNSURE

## 2016-12-31 NOTE — Anesthesia Procedure Notes (Signed)
Epidural Patient location during procedure: OB Start time: 12/31/2016 5:29 AM End time: 12/31/2016 5:39 AM  Staffing Anesthesiologist: Heather RobertsSINGER, Oumou Smead Performed: anesthesiologist   Preanesthetic Checklist Completed: patient identified, site marked, pre-op evaluation, timeout performed, IV checked, risks and benefits discussed and monitors and equipment checked  Epidural Patient position: sitting Prep: DuraPrep Patient monitoring: heart rate, cardiac monitor, continuous pulse ox and blood pressure Approach: midline Location: L2-L3 Injection technique: LOR saline  Needle:  Needle type: Tuohy  Needle gauge: 17 G Needle length: 9 cm Needle insertion depth: 5 cm Catheter size: 20 Guage Catheter at skin depth: 9 cm Test dose: negative and Other  Assessment Events: blood not aspirated, injection not painful, no injection resistance and negative IV test  Additional Notes Informed consent obtained prior to proceeding including risk of failure, 1% risk of PDPH, risk of minor discomfort and bruising.  Discussed rare but serious complications including epidural abscess, permanent nerve injury, epidural hematoma.  Discussed alternatives to epidural analgesia and patient desires to proceed.  Timeout performed pre-procedure verifying patient name, procedure, and platelet count.  Patient tolerated procedure well.

## 2016-12-31 NOTE — Addendum Note (Signed)
Addendum  created 12/31/16 1658 by Shanon PayorGregory, Williard Keller M, CRNA   Charge Capture section accepted, Sign clinical note

## 2017-01-01 MED ORDER — AMLODIPINE BESYLATE 5 MG PO TABS: 5.0000 mg | ORAL_TABLET | Freq: Every day | ORAL | 1 refills | Status: DC

## 2017-01-01 MED ORDER — ACETAMINOPHEN 325 MG PO TABS: 650.0000 mg | ORAL_TABLET | ORAL | Status: DC | PRN

## 2017-01-01 NOTE — Discharge Summary (Signed)
OB Discharge Summary     Patient Name: Becky Torres DOB: Aug 18, 1990 MRN: [id number]  Date of admission: 12/31/2016 Delivering MD: Lezlie Octave C   Date of discharge: 01/01/2017  Admitting diagnosis: 39 WEEKS CTX Intrauterine pregnancy: [redacted]w[redacted]d     Secondary diagnosis:  Active Problems:   SVD (spontaneous vaginal delivery)  Additional problems: cHTN; limited OB care of 2 visits     Discharge diagnosis: Term Pregnancy Delivered and CHTN                                                                                                Post partum procedures:none  Augmentation: Pitocin  Complications: None  Hospital course:  Onset of Labor With Vaginal Delivery     26 y.o. yo [id number] at [redacted]w[redacted]d was admitted in Latent Labor on 12/31/2016. Pre-e labs were collected due to some elevated BPs, and the labs were normal. Patient had an uncomplicated labor course as follows:  Membrane Rupture Time/Date: 8:48 AM ,12/31/2016   Intrapartum Procedures: Episiotomy: None [1]                                         Lacerations:  None [1]  Patient had a delivery of a Viable infant. 12/31/2016  Information for the patient's newborn:  Nolie, [place] [facility] [[id number]]  Delivery Method: Vaginal, Spontaneous Delivery (Filed from Delivery Summary)    Patient's postpartum course was significant for being started on Norvasc 5mg  after delivery.  She is ambulating, tolerating a regular diet, passing flatus, and urinating well. Her blood pressures are nl on medication and she desires early d/c. Patient is discharged home in stable condition on 01/01/17, but will be seen by SW prior to d/c due to her limited prenatal care of 2 visits.   Physical exam  Vitals:   12/31/16 1430 12/31/16 1822 01/01/17 0230 01/01/17 0600  BP: 103/67 123/87 120/86 128/61  Pulse: 98 86 77 60  Resp: 16 16 16 18   Temp: 98 F (36.7 C) 97.8 F (36.6 C) 98.3 F (36.8 C) 98.1 F (36.7 C)  TempSrc: Axillary Oral Oral Oral  SpO2:   100%    Weight:      Height:       General: alert and cooperative Lochia: appropriate Uterine Fundus: firm Incision: N/A DVT Evaluation: No evidence of DVT seen on physical exam. Labs: Lab Results  Component Value Date   WBC 9.0 12/31/2016   HGB 9.8 (L) 12/31/2016   HCT 31.1 (L) 12/31/2016   MCV 69.1 (L) 12/31/2016   PLT 178 12/31/2016   CMP Latest Ref Rng & Units 12/31/2016  Glucose 65 - 99 mg/dL 88  BUN 6 - 20 mg/dL 12  Creatinine 1.30 - 8.65 mg/dL 7.84  Sodium 696 - 295 mmol/L 132(L)  Potassium 3.5 - 5.1 mmol/L 4.0  Chloride 101 - 111 mmol/L 102  CO2 22 - 32 mmol/L 22  Calcium 8.9 - 10.3 mg/dL 2.8(U)  Total Protein 6.5 - 8.1 g/dL 7.0  Total Bilirubin  0.3 - 1.2 mg/dL 0.3  Alkaline Phos 38 - 126 U/L 124  AST 15 - 41 U/L 19  ALT 14 - 54 U/L 14    Discharge instruction: per After Visit Summary and "Baby and Me Booklet".  After visit meds:  Allergies as of 01/01/2017   No Known Allergies     Medication List    STOP taking these medications   labetalol 100 MG tablet Commonly known as:  NORMODYNE     TAKE these medications   acetaminophen 325 MG tablet Commonly known as:  TYLENOL Take 2 tablets (650 mg total) by mouth every 4 (four) hours as needed (for pain scale < 4).   amLODipine 5 MG tablet Commonly known as:  NORVASC Take 1 tablet (5 mg total) by mouth daily.   PRENATAL GUMMIES/DHA & FA 0.4-32.5 MG Chew Chew 1 capsule by mouth 2 (two) times daily.            Discharge Care Instructions        Start     Ordered   01/01/17 0000  amLODipine (NORVASC) 5 MG tablet  Daily    Question:  Supervising Provider  Answer:  Jaynie CollinsNYANWU, UGONNA A   01/01/17 0903   01/01/17 0000  acetaminophen (TYLENOL) 325 MG tablet  Every 4 hours PRN    Question:  Supervising Provider  Answer:  Tereso NewcomerNYANWU, UGONNA A   01/01/17 [id number]   01/01/17 0000  Discharge patient    Comments:  Pt needs to see social work before she is discharged  Question Answer Comment  Discharge disposition  01-Home or Self Care   Discharge patient date 01/01/2017      01/01/17 0903   12/31/16 0000  OB RESULTS CONSOLE GC/Chlamydia    Comments:  This external order was created through the Results Console.   12/31/16 0405      Diet: routine diet  Activity: Advance as tolerated. Pelvic rest for 6 weeks.   Outpatient follow up:1 week for BP check, then 4 week PP visit Follow up Appt:Future Appointments Date Time Provider Department Center  01/31/2017 8:30 AM Adam PhenixArnold, James G, MD [facility] None   Follow up Visit:No Follow-up on file.  Postpartum contraception: Depo Provera  Newborn Data: Live born female  Birth Weight: 5 lb 15.9 oz (2719 g) APGAR: 9, 9  Baby Feeding: Bottle Disposition:home with mother   01/01/2017 Cam HaiSHAW, Morayma [place], CNM  9:26 AM

## 2017-01-01 NOTE — Clinical Social Work Maternal (Addendum)
  CLINICAL SOCIAL WORK MATERNAL/CHILD NOTE  Patient Details  Name: Becky Torres MRN: [id number] Date of Birth: [birth year]  Date:  01/01/2017  Clinical Social Worker Initiating Note:  Laurey Arrow Date/ Time Initiated:  01/01/17/1107     Child's Name:  Becky Torres   Legal Guardian:  Mother (FOB is Becky Torres 11/28/83)   Need for Interpreter:  None   Date of Referral:  01/01/17     Reason for Referral:  Late or No Prenatal Care    Referral Source:  [facility]   Address:  [address] [place] [address]  Phone number:  [phone]   Household Members:  (S) Self, Minor Children, Relatives (MOB lives with MOB's great grandmother and MOB's 3 children Becky Torres 07/26/10 and Becky Torres. 09/19/12))   Natural Supports (not living in the home):  Immediate Family, Friends, Extended Family, Spouse/significant other, Artist Supports: None   Employment: Animator   Type of Work: Engineer, petroleum at [employer]   Education:  [employer] Herbalist Resources:  [employer]   Other Resources:  Physicist, medical    Cultural/Religious Considerations Which May Impact Care:  Per [employer] Presenter, broadcasting, MOB religion is Non-denominational.   Strengths:  Ability to meet basic needs , Pediatrician chosen , Home prepared for child    Risk Factors/Current Problems:  None   Cognitive State:  Alert , Able to Concentrate , Linear Thinking , Insightful    Mood/Affect:  Calm , Happy , Bright , Interested , Comfortable    CSW Assessment: CSW met with MOB to complete an assessment for limited PNC. When CSW arrived, MOB was in bed engaging in skin to skin with infant. CSW inquired about MOB's limited PNC and MOB acknowledged attending 2 prenatal visits. MOB reported that MOB was intensely sick during MOB's first trimester and was unable to get out of bed. MOB stated work, and MOB's hectic daily schedule prevented MOB from consistently attending  appointment. MOB denied barriers to follow-up appointments for MOB and infant after d/c.   CSW informed MOB of hospital's drug screen policy regarding limited PNC, and encouraged MOB to ask questions.  MOB was understanding and denied the use of all illicit substance. CSW informed MOB that the infant's UDS was negative and CSW will continue to monitor the infant's CDS. CSW made MOB aware that if CDS results are positive for infant for any substance without an explanation, CSW will make a report to [facility] CPS. MOB understood the hospital's policy and procedures and did not have any questions or concerns; MOB also denied CPS hx.   MOB was appropriate with infant during the assessment and responded appropriately to the infant's cues. MOB communicated MOB is prepared for the infant and has all needed items. CSW thanked MOB for meeting with CSW and provided MOB with CSW contact information.    CSW Plan/Description:  (S) Information/Referral to [employer] , [employer] , No Further Intervention Required/No Barriers to Discharge (CSW will monitor infant's CDS and will make a report to [facility] if warranted. )   Laurey Arrow, MSW, LCSW Clinical Social Work [phone]  [name], [place] 01/01/2017, 12:11 PM

## 2017-01-01 NOTE — Discharge Instructions (Signed)
Postpartum Hypertension °Postpartum hypertension is high blood pressure after pregnancy that remains higher than normal for more than two days after delivery. You may not realize that you have postpartum hypertension if your blood pressure is not being checked regularly. In some cases, postpartum hypertension will go away on its own, usually within a week of delivery. However, for some women, medical treatment is required to prevent serious complications, such as seizures or stroke. °The following things can affect your blood pressure: °· The type of delivery you had. °· Having received IV fluids or other medicines during or after delivery. ° °What are the causes? °Postpartum hypertension may be caused by any of the following or by a combination of any of the following: °· Hypertension that existed before pregnancy (chronic hypertension). °· Gestational hypertension. °· Preeclampsia or eclampsia. °· Receiving a lot of fluid through an IV during or after delivery. °· Medicines. °· HELLP syndrome. °· Hyperthyroidism. °· Stroke. °· Other rare neurological or blood disorders. ° °In some cases, the cause may not be known. °What increases the risk? °Postpartum hypertension can be related to one or more risk factors, such as: °· Chronic hypertension. In some cases, this may not have been diagnosed before pregnancy. °· Obesity. °· Type 2 diabetes. °· Kidney disease. °· Family history of preeclampsia. °· Other medical conditions that cause hormonal imbalances. ° °What are the signs or symptoms? °As with all types of hypertension, postpartum hypertension may not have any symptoms. Depending on how high your blood pressure is, you may experience: °· Headaches. These may be mild, moderate, or severe. They may also be steady, constant, or sudden in onset (thunderclap headache). °· Visual changes. °· Dizziness. °· Shortness of breath. °· Swelling of your hands, feet, lower legs, or face. In some cases, you may have swelling in  more than one of these locations. °· Heart palpitations or a racing heartbeat. °· Difficulty breathing while lying down. °· Decreased urination. ° °Other rare signs and symptoms may include: °· Sweating more than usual. This lasts longer than a few days after delivery. °· Chest pain. °· Sudden dizziness when you get up from sitting or lying down. °· Seizures. °· Nausea or vomiting. °· Abdominal pain. ° °How is this diagnosed? °The diagnosis of postpartum hypertension is made through a combination of physical examination findings and testing of your blood and urine. You may also have additional tests, such as a CT scan or an MRI, to check for other complications of postpartum hypertension. °How is this treated? °When blood pressure is high enough to require treatment, your options may include: °· Medicines to reduce blood pressure (antihypertensives). Tell your health care provider if you are breastfeeding or if you plan to breastfeed. There are many antihypertensive medicines that are safe to take while breastfeeding. °· Stopping medicines that may be causing hypertension. °· Treating medical conditions that are causing hypertension. °· Treating the complications of hypertension, such as seizures, stroke, or kidney problems. ° °Your health care provider will also continue to monitor your blood pressure closely and repeatedly until it is within a safe range for you. °Follow these instructions at home: °· Take medicines only as directed by your health care provider. °· Get regular exercise after your health care provider tells you that it is safe. °· Follow your health care provider’s recommendations on fluid and salt restrictions. °· Do not use any tobacco products, including cigarettes, chewing tobacco, or electronic cigarettes. If you need help quitting, ask your health care provider. °·   Keep all follow-up visits as directed by your health care provider. This is important. °Contact a health care provider  if: °· Your symptoms get worse. °· You have new symptoms, such as: °? Headache. °? Dizziness. °? Visual changes. °Get help right away if: °· You develop a severe or sudden headache. °· You have seizures. °· You develop numbness or weakness on one side of your body. °· You have difficulty thinking, speaking, or swallowing. °· You develop severe abdominal pain. °· You develop difficulty breathing, chest pain, a racing heartbeat, or heart palpitations. °These symptoms may represent a serious problem that is an emergency. Do not wait to see if the symptoms will go away. Get medical help right away. Call your local emergency services (911 in the [place] Do not drive yourself to the hospital. °This information is not intended to replace advice given to you by your health care provider. Make sure you discuss any questions you have with your health care provider. °Document Released: 12/21/2013 Document Revised: 09/22/2015 Document Reviewed: 11/01/2013 °Elsevier Interactive Patient Education © 2018 Elsevier Inc. ° °Home Care Instructions for Mom °ACTIVITY °· Gradually return to your regular activities. °· Let yourself rest. Nap while your baby sleeps. °· Avoid lifting anything that is heavier than 10 lb (4.5 kg) until your health care provider says it is okay. °· Avoid activities that take a lot of effort and energy (are strenuous) until approved by your health care provider. Walking at a slow-to-moderate pace is usually safe. °· If you had a cesarean delivery: °? Do not vacuum, climb stairs, or drive a car for 4-6 weeks. °? Have someone help you at home until you feel like you can do your usual activities yourself. °? Do exercises as told by your health care provider, if this applies. ° °VAGINAL BLEEDING °You may continue to bleed for 4-6 weeks after delivery. Over time, the amount of blood usually decreases and the color of the blood usually gets lighter. However, the flow of bright red blood may increase if you have been  too active. If you need to use more than one pad in an hour because your pad gets soaked, or if you pass a large clot: °· Lie down. °· Raise your feet. °· Place a cold compress on your lower abdomen. °· Rest. °· Call your health care provider. ° °If you are breastfeeding, your period should return anytime between 8 weeks after delivery and the time that you stop breastfeeding. If you are not breastfeeding, your period should return 6-8 weeks after delivery. °PERINEAL CARE °The perineal area, or perineum, is the part of your body between your thighs. After delivery, this area needs special care. Follow these instructions as told by your health care provider. °· Take warm tub baths for 15-20 minutes. °· Use medicated pads and pain-relieving sprays and creams as told. °· Do not use tampons or douches until vaginal bleeding has stopped. °· Each time you go to the bathroom: °? Use a peri bottle. °? Change your pad. °? Use towelettes in place of toilet paper until your stitches have healed. °· Do Kegel exercises every day. Kegel exercises help to maintain the muscles that support the vagina, bladder, and bowels. You can do these exercises while you are standing, sitting, or lying down. To do Kegel exercises: °? Tighten the muscles of your abdomen and the muscles that surround your birth canal. °? Hold for a few seconds. °? Relax. °? Repeat until you have done this 5 times   in a row. °· To prevent hemorrhoids from developing or getting worse: °? Drink enough fluid to keep your urine clear or pale yellow. °? Avoid straining when having a bowel movement. °? Take over-the-counter medicines and stool softeners as told by your health care provider. ° °BREAST CARE °· Wear a tight-fitting bra. °· Avoid taking over-the-counter pain medicine for breast discomfort. °· Apply ice to the breasts to help with discomfort as needed: °? Put ice in a plastic bag. °? Place a towel between your skin and the bag. °? Leave the ice on for 20  minutes or as told by your health care provider. ° °NUTRITION °· Eat a well-balanced diet. °· Do not try to lose weight quickly by cutting back on calories. °· Take your prenatal vitamins until your postpartum checkup or until your health care provider tells you to stop. ° °POSTPARTUM DEPRESSION °You may find yourself crying for no apparent reason and unable to cope with all of the changes that come with having a newborn. This mood is called postpartum depression. Postpartum depression happens because your hormone levels change after delivery. If you have postpartum depression, get support from your partner, friends, and family. If the depression does not go away on its own after several weeks, contact your health care provider. °BREAST SELF-EXAM °Do a breast self-exam each month, at the same time of the month. If you are breastfeeding, check your breasts just after a feeding, when your breasts are less full. If you are breastfeeding and your period has started, check your breasts on day 5, 6, or 7 of your period. °Report any lumps, bumps, or discharge to your health care provider. Know that breasts are normally lumpy if you are breastfeeding. This is temporary, and it is not a health risk. °INTIMACY AND SEXUALITY °Avoid sexual activity for at least 3-4 weeks after delivery or until the brownish-red vaginal flow is completely gone. If you want to avoid pregnancy, use some form of birth control. You can get pregnant after delivery, even if you have not had your period. °SEEK MEDICAL CARE IF: °· You feel unable to cope with the changes that a child brings to your life, and these feelings do not go away after several weeks. °· You notice a lump, a bump, or discharge on your breast. ° °SEEK IMMEDIATE MEDICAL CARE IF: °· Blood soaks your pad in 1 hour or less. °· You have: °? Severe pain or cramping in your lower abdomen. °? A bad-smelling vaginal discharge. °? A fever that is not controlled by medicine. °? A fever, and  an area of your breast is red and sore. °? Pain or redness in your calf. °? Sudden, severe chest pain. °? Shortness of breath. °? Painful or bloody urination. °? Problems with your vision. °· You vomit for 12 hours or longer. °· You develop a severe headache. °· You have serious thoughts about hurting yourself, your child, or anyone else. ° °This information is not intended to replace advice given to you by your health care provider. Make sure you discuss any questions you have with your health care provider. °Document Released: 04/16/2000 Document Revised: 09/25/2015 Document Reviewed: 10/21/2014 °Elsevier Interactive Patient Education © 2017 Elsevier Inc. ° °

## 2017-01-01 NOTE — Lactation Note (Signed)
This note was copied from a baby's chart. Lactation Consultation Note  Patient Name: Girl Colon BranchKalisa Donna UJWJX'BToday's Date: 01/01/2017 Reason for consult: Initial assessment  Baby 25 hours old. Mom reports that she thought she wanted to attempt to nurse some, but then decided to just give formula. Asked mom about her decision, and mom stated that she was afraid her diet--not always eating often enough d/t busy schedule and eating out often--might make her milk not ideal for baby. Discussed benefits of mom's milk and provided education regarding nutrition. However, mom stated that she intends to "stick with formula."  Enc mom to call for assistance as needed.   Maternal Data    Feeding Feeding Type: Bottle Fed - Formula Nipple Type: Slow - flow  LATCH Score                   Interventions    Lactation Tools Discussed/Used     Consult Status Consult Status: Complete    Sherlyn HayJennifer D Georg Ang 01/01/2017, 10:48 AM

## 2017-01-04 ENCOUNTER — Ambulatory Visit: Payer: Medicaid Other

## 2017-01-04 VITALS — BP 135/105 | HR 94 | Wt 158.4 lb

## 2017-01-04 DIAGNOSIS — I10 Essential (primary) hypertension: Secondary | ICD-10-CM

## 2017-01-04 NOTE — Addendum Note (Signed)
Addended by: Maretta BeesMCGLASHAN, Zabria Liss J on: 01/04/2017 10:35 AM   Modules accepted: Level of Service

## 2017-01-04 NOTE — Progress Notes (Signed)
Patient presents for BP check, 4 days PP. Denies headaches, dizziness, auroras, fainting and nausea. Complains of breast pains because of milk production.  She did not take current medications to visit.  She took her med. 1 hr. before visit  Subjective:  Becky Torres is a 26 y.o. female with hypertension. BP 135/105 Pulse 94  Current Outpatient Prescriptions  Medication Sig Dispense Refill  . acetaminophen (TYLENOL) 325 MG tablet Take 2 tablets (650 mg total) by mouth every 4 (four) hours as needed (for pain scale < 4).    Marland Kitchen. amLODipine (NORVASC) 5 MG tablet Take 1 tablet (5 mg total) by mouth daily. 30 tablet 1  . Prenatal MV-Min-FA-Omega-3 (PRENATAL GUMMIES/DHA & FA) 0.4-32.5 MG CHEW Chew 1 capsule by mouth 2 (two) times daily.     No current facility-administered medications for this visit.     Hypertension ROS: taking medications as instructed, no medication side effects noted, no TIA's, no chest pain on exertion, no dyspnea on exertion and no swelling of ankles.  New concerns:    Objective:  LMP 04/13/2016   Appearance alert, well appearing, and in no distress. General exam BP noted to be well controlled today in office.    Assessment:   Hypertension no significant medication side effects noted.   Plan:  The following changes are to be made: Take 2 tablets per day (10 mg) and recheck Bp on Friday..Marland Kitchen

## 2017-01-06 ENCOUNTER — Encounter: Payer: Medicaid Other | Admitting: Obstetrics and Gynecology

## 2017-01-31 ENCOUNTER — Ambulatory Visit: Payer: Self-pay | Admitting: Obstetrics & Gynecology

## 2017-02-17 ENCOUNTER — Ambulatory Visit: Payer: Self-pay | Admitting: Obstetrics and Gynecology

## 2017-06-21 ENCOUNTER — Emergency Department (HOSPITAL_COMMUNITY)
Admission: EM | Admit: 2017-06-21 | Discharge: 2017-06-21 | Disposition: A | Discharge status: Home / Self Care | Payer: Self-pay | Attending: Emergency Medicine | Admitting: Emergency Medicine

## 2017-06-21 ENCOUNTER — Other Ambulatory Visit: Payer: Self-pay

## 2017-06-21 ENCOUNTER — Emergency Department (HOSPITAL_COMMUNITY): Payer: Self-pay

## 2017-06-21 DIAGNOSIS — R519 Headache, unspecified: Secondary | ICD-10-CM

## 2017-06-21 DIAGNOSIS — Z79899 Other long term (current) drug therapy: Secondary | ICD-10-CM | POA: Insufficient documentation

## 2017-06-21 DIAGNOSIS — R51 Headache: Secondary | ICD-10-CM | POA: Insufficient documentation

## 2017-06-21 DIAGNOSIS — I10 Essential (primary) hypertension: Secondary | ICD-10-CM | POA: Insufficient documentation

## 2017-06-21 DIAGNOSIS — R55 Syncope and collapse: Secondary | ICD-10-CM | POA: Insufficient documentation

## 2017-06-21 LAB — CBC WITH DIFFERENTIAL/PLATELET
Basophils Absolute: 0 10*3/uL (ref 0.0–0.1)
Basophils Relative: 0 %
Eosinophils Absolute: 0.2 10*3/uL (ref 0.0–0.7)
Eosinophils Relative: 2 %
HCT: 32 % — ABNORMAL LOW (ref 36.0–46.0)
Hemoglobin: 10.2 g/dL — ABNORMAL LOW (ref 12.0–15.0)
Lymphocytes Relative: 14 %
Lymphs Abs: 1.2 10*3/uL (ref 0.7–4.0)
MCH: 23.3 pg — ABNORMAL LOW (ref 26.0–34.0)
MCHC: 31.9 g/dL (ref 30.0–36.0)
MCV: 73.2 fL — ABNORMAL LOW (ref 78.0–100.0)
Monocytes Absolute: 0.7 10*3/uL (ref 0.1–1.0)
Monocytes Relative: 8 %
Neutro Abs: 6.3 10*3/uL (ref 1.7–7.7)
Neutrophils Relative %: 76 %
Platelets: 277 10*3/uL (ref 150–400)
RBC: 4.37 MIL/uL (ref 3.87–5.11)
RDW: 17.7 % — ABNORMAL HIGH (ref 11.5–15.5)
WBC: 8.4 10*3/uL (ref 4.0–10.5)

## 2017-06-21 LAB — BASIC METABOLIC PANEL
Anion gap: 10 (ref 5–15)
BUN: 8 mg/dL (ref 6–20)
CO2: 24 mmol/L (ref 22–32)
Calcium: 8.8 mg/dL — ABNORMAL LOW (ref 8.9–10.3)
Chloride: 104 mmol/L (ref 101–111)
Creatinine, Ser: 0.87 mg/dL (ref 0.44–1.00)
GFR calc Af Amer: >60 mL/min (ref >60)
GFR calc non Af Amer: >60 mL/min (ref >60)
Glucose, Bld: 100 mg/dL — ABNORMAL HIGH (ref 65–99)
Potassium: 3.4 mmol/L — ABNORMAL LOW (ref 3.5–5.1)
Sodium: 138 mmol/L (ref 135–145)

## 2017-06-21 LAB — I-STAT BETA HCG BLOOD, ED (MC, WL, AP ONLY): I-stat hCG, quantitative: <5 m[IU]/mL (ref <5)

## 2017-06-21 LAB — CBG MONITORING, ED: Glucose-Capillary: 82 mg/dL (ref 65–99)

## 2017-06-21 MED ORDER — POTASSIUM CHLORIDE CRYS ER 20 MEQ PO TBCR
40.0000 meq | EXTENDED_RELEASE_TABLET | Freq: Once | ORAL | Status: AC
  Administered 2017-06-21: 40 meq via ORAL
  Filled 2017-06-21: qty 2

## 2017-06-21 MED ORDER — SODIUM CHLORIDE 0.9 % IV BOLUS (SEPSIS)
1000.0000 mL | Freq: Once | INTRAVENOUS | Status: AC
  Administered 2017-06-21: 1000 mL via INTRAVENOUS

## 2017-06-21 NOTE — Discharge Instructions (Signed)
Start taking her blood pressure medication consistently.  Drink plenty of fluids and get plenty of rest.  Change positions slowly when you go from a laying to standing position.  Follow-up with your primary care physician for reevaluation of your high blood pressure and your syncopal episodes.  Return to the emergency department if any concerning signs or symptoms develop such as severe headache, vision changes, slurred speech, passing out, or weakness.

## 2017-06-21 NOTE — ED Notes (Signed)
Bed: WA06 Expected date:  Expected time:  Means of arrival:  Comments: Ems syncope

## 2017-06-21 NOTE — ED Provider Notes (Signed)
[facility]-EMERGENCY DEPT Provider Note   CSN: [id number] Arrival date & time: 06/21/17  1359     History   Chief Complaint Chief Complaint  Patient presents with  . Loss of Consciousness    HPI Becky Torres is a 27 y.o. female with history of hypertension, sickle cell trait presents today for evaluation of syncopal episode.  She states that just prior to arrival she was in the kitchen fixing her infant's meal when she began to feel acutely lightheaded.  She states that she walked with her infant and hand towards the couch but lost consciousness.  She thinks she did hit her head.  Per her partner she had lost consciousness for approximately 10-15 seconds.  She states that when she regained consciousness she remained feeling lightheaded for approximately 5 minutes before resolution.  At this time she endorses mild aching headache to the occipital region which does not radiate.  Denies vision changes, numbness, tingling, weakness, chest pain, or shortness of breath.  She endorses aching right-sided neck pain which worsens with movement.  She states that directly after syncopal episode she had one episode of nonbloody nonbilious emesis.  No nausea at this time.  No medications prior to arrival.  She has been ambulatory since without difficulty. States she has a history of similar syncopal episodes in the past and states she was told it was due to "getting up too quickly and the blood leaving my brain too fast".   The history is provided by the patient.    Past Medical History:  Diagnosis Date  . Anemia   . Chlamydia   . Hypertension    chronic, diagnosed at age 77, takes Labetalol  . Sickle cell trait [facility])     Patient Active Problem List   Diagnosis Date Noted  . SVD (spontaneous vaginal delivery) 12/31/2016  . Supervision of other normal pregnancy, antepartum 09/30/2016  . Late prenatal care 09/19/2012  . Supervision of other normal pregnancy 07/24/2012  .  Chlamydia trachomatis infection in pregnancy 07/24/2012  . Hypertension 07/24/2012  . Hypertension in pregnancy, antepartum 07/24/2012  . Short cervix affecting pregnancy 07/01/2012  . Preterm uterine contractions, antepartum 07/01/2012    No past surgical history on file.  OB History    Gravida Para Term Preterm AB Living   4 3 3  0 1 3   SAB TAB Ectopic Multiple Live Births   1 0 0 0 3       Home Medications    Prior to Admission medications   Medication Sig Start Date End Date Taking? Authorizing Provider  acetaminophen (TYLENOL) 325 MG tablet Take 2 tablets (650 mg total) by mouth every 4 (four) hours as needed (for pain scale < 4). 01/01/17  Yes Arabella Merles, CNM  labetalol (NORMODYNE) 200 MG tablet Take 200 mg by mouth once.   Yes [provider]  amLODipine (NORVASC) 5 MG tablet Take 1 tablet (5 mg total) by mouth daily. Patient not taking: Reported on 06/21/2017 01/01/17   Arabella Merles, CNM  Prenatal MV-Min-FA-Omega-3 (PRENATAL GUMMIES/DHA & FA) 0.4-32.5 MG CHEW Chew 1 capsule by mouth 2 (two) times daily.    [provider]    Family History Family History  Problem Relation Age of Onset  . Hypertension Mother   . Hypertension Brother   . Hypertension Maternal Aunt   . Hypertension Maternal Grandmother     Social History Social History   Tobacco Use  . Smoking status: Never Smoker  .  Smokeless tobacco: Never Used  Substance Use Topics  . Alcohol use: No  . Drug use: No     Allergies   Patient has no known allergies.   Review of Systems Review of Systems  Constitutional: Negative for chills and fever.  Eyes: Negative for visual disturbance.  Respiratory: Negative for shortness of breath.   Cardiovascular: Negative for chest pain.  Gastrointestinal: Positive for vomiting. Negative for nausea.  Musculoskeletal: Positive for neck pain.  Neurological: Positive for syncope, light-headedness and headaches.  All other systems reviewed  and are negative.    Physical Exam Updated Vital Signs BP (!) 168/107   Pulse 77   Temp 98 F (36.7 C) (Oral)   Resp 18   Ht 5\' 4"  (1.626 m)   Wt 68 kg (150 lb)   LMP 05/21/2017 (Approximate)   SpO2 99%   Breastfeeding? No Comment: Neg U preg 06/21/17  BMI 25.75 kg/m   Physical Exam  Constitutional: She is oriented to person, place, and time. She appears well-developed and well-nourished. No distress.  HENT:  Head: Normocephalic and atraumatic.  Right Ear: External ear normal.  Left Ear: External ear normal.  Mouth/Throat: Oropharynx is clear and moist.  No Battle's signs, no raccoon's eyes, no rhinorrhea. No tenderness to palpation of the face or skull. No deformity, crepitus, or swelling noted.   Eyes: Conjunctivae and EOM are normal. Pupils are equal, round, and reactive to light. Right eye exhibits no discharge. Left eye exhibits no discharge.  Neck: Normal range of motion. Neck supple. No JVD present. No tracheal deviation present.  No midline spine TTP, right paracervical muscle tenderness overlying the trapezius, no deformity, crepitus, or step-off noted   Cardiovascular: Normal rate, regular rhythm, normal heart sounds and intact distal pulses.  Pulmonary/Chest: Effort normal and breath sounds normal. No stridor. No respiratory distress. She has no wheezes. She has no rales. She exhibits no tenderness.  Abdominal: Soft. Bowel sounds are normal. She exhibits no distension. There is no tenderness. There is no guarding.  Musculoskeletal: Normal range of motion. She exhibits no edema or tenderness.  No midline spine TTP, no paraspinal muscle tenderness, no deformity, crepitus, or step-off noted.  5/5 strength of BUE and BLE major muscle groups with no deformity, crepitus, swelling, or ecchymosis noted on palpation of the extremities  Neurological: She is alert and oriented to person, place, and time. No cranial nerve deficit or sensory deficit. She exhibits normal muscle tone.   Mental Status:  Alert, thought content appropriate, able to give a coherent history. Speech fluent without evidence of aphasia. Able to follow 2 step commands without difficulty.  Cranial Nerves:  II:  Peripheral visual fields grossly normal, pupils equal, round, reactive to light III,IV, VI: ptosis not present, extra-ocular motions intact bilaterally  V,VII: smile symmetric, facial light touch sensation equal VIII: hearing grossly normal to voice  X: uvula elevates symmetrically  XI: bilateral shoulder shrug symmetric and strong XII: midline tongue extension without fassiculations Motor:  Normal tone. 5/5 strength of BUE and BLE major muscle groups including strong and equal grip strength and dorsiflexion/plantar flexion Sensory: light touch normal in all extremities. Cerebellar: normal finger-to-nose with bilateral upper extremities Gait: normal gait and balance. Able to walk on toes and heels with ease.  CV: 2+ radial and DP/PT pulses   Skin: Skin is warm and dry. No erythema.  Psychiatric: She has a normal mood and affect. Her behavior is normal.  Nursing note and vitals reviewed.    ED  Treatments / Results  Labs (all labs ordered are listed, but only abnormal results are displayed) Labs Reviewed  BASIC METABOLIC PANEL - Abnormal; Notable for the following components:      Result Value   Potassium 3.4 (*)    Glucose, Bld 100 (*)    Calcium 8.8 (*)    All other components within normal limits  CBC WITH DIFFERENTIAL/PLATELET - Abnormal; Notable for the following components:   Hemoglobin 10.2 (*)    HCT 32.0 (*)    MCV 73.2 (*)    MCH 23.3 (*)    RDW 17.7 (*)    All other components within normal limits  CBG MONITORING, ED  I-STAT BETA HCG BLOOD, ED ([facility], [facility], [facility] ONLY)    EKG  EKG Interpretation  Date/Time:  Tuesday June 21 2017 14:20:07 EST Ventricular Rate:  65 PR Interval:    QRS Duration: 85 QT Interval:  468 QTC Calculation: 487 R Axis:   75 Text  Interpretation:  Sinus rhythm Nonspecific T abnormalities, anterior leads Borderline prolonged QT interval Confirmed by Raeford Razor [phone]) on 06/21/2017 4:24:26 PM       Radiology Ct Head Wo Contrast  Result Date: 06/21/2017 CLINICAL DATA:  Syncopal episode with resultant posttraumatic headache after abruptly standing up from laying position. EXAM: CT HEAD WITHOUT CONTRAST TECHNIQUE: Contiguous axial images were obtained from the base of the skull through the vertex without intravenous contrast. COMPARISON:  None. FINDINGS: Brain: No evidence of acute infarction, hemorrhage, hydrocephalus, extra-axial collection or mass lesion/mass effect. Vascular: No hyperdense vessel or unexpected calcification. Skull: Normal. Negative for fracture or focal lesion. Sinuses/Orbits: No acute finding. Other: None IMPRESSION: Normal head CT. Electronically Signed   By: Tollie Eth M.D.   On: 06/21/2017 16:16    Procedures Procedures (including critical care time)  Medications Ordered in ED Medications  sodium chloride 0.9 % bolus 1,000 mL (0 mLs Intravenous Stopped 06/21/17 1650)  potassium chloride SA (K-DUR,KLOR-CON) CR tablet 40 mEq (40 mEq Oral Given 06/21/17 1651)     Initial Impression / Assessment and Plan / ED Course  I have reviewed the triage vital signs and the nursing notes.  Pertinent labs & imaging results that were available during my care of the patient were reviewed by me and considered in my medical decision making (see chart for details).     Patient presents for evaluation after syncopal episode.  She has a history of the same in the past.  She was found to be hypertensive while in the emergency department but states she has not been taking her blood pressure medication.  She is nontoxic in appearance.  No focal neurologic deficits.  She complains of a very mild headache to the occipital region, no pain on palpation.  CT of the head shows no acute abnormalities.  No evidence of ICH,  SAH, or skull fracture.  EKG shows no significant changes from last tracing, no evidence of ST segment abnormality or arrhythmia.  She is mildly hypokalemic, replenished in the ED.  No other electrolyte abnormalities, no leukocytosis.  She has a stable anemia.  She is not hypoglycemic, and not pregnant.  Ambulatory without difficulty, no lightheadedness or abnormal gait.  On reevaluation, patient is resting comfortably and states she feels much better.  Suspect either orthostatic hypotension versus vasovagal syncope of which patient has a history of both.  Encouraged patient to take her blood pressure medications as prescribed and follow-up with her primary care physician for reevaluation of syncopal episodes and  hypertension.  Discussed indications for return to the ED. Pt verbalized understanding of and agreement with plan and is safe for discharge home at this time.  She has no complaints prior to discharge.  Final Clinical Impressions(s) / ED Diagnoses   Final diagnoses:  Syncope and collapse  Mild headache  Asymptomatic hypertension    ED Discharge Orders    None       Jeanie Sewer, PA-C 06/21/17 1656    Melene Plan, DO 06/22/17 1049

## 2017-06-21 NOTE — ED Triage Notes (Signed)
Per EMS, patient comes from home with husband. Laying on the couch with baby when she stood up quickly from the couch and had a syncopal episode. Husband reports she was out for ~10-15 seconds. On arrival patient was alert and laying on couch feeling lightheaded. Happened to her once before when she was 17. No pain. Hypertensive, suppose to be on labetalol but not taking meds. 12lead unremarkable, orthostatics normal.

## 2017-07-03 DIAGNOSIS — I10 Essential (primary) hypertension: Secondary | ICD-10-CM | POA: Insufficient documentation

## 2017-07-03 DIAGNOSIS — Y999 Unspecified external cause status: Secondary | ICD-10-CM | POA: Insufficient documentation

## 2017-07-03 DIAGNOSIS — Z79899 Other long term (current) drug therapy: Secondary | ICD-10-CM | POA: Insufficient documentation

## 2017-07-03 DIAGNOSIS — Y939 Activity, unspecified: Secondary | ICD-10-CM | POA: Insufficient documentation

## 2017-07-03 DIAGNOSIS — S0083XA Contusion of other part of head, initial encounter: Secondary | ICD-10-CM | POA: Insufficient documentation

## 2017-07-03 DIAGNOSIS — Y929 Unspecified place or not applicable: Secondary | ICD-10-CM | POA: Insufficient documentation

## 2017-07-03 DIAGNOSIS — M79605 Pain in left leg: Secondary | ICD-10-CM | POA: Insufficient documentation

## 2017-07-04 ENCOUNTER — Emergency Department (HOSPITAL_COMMUNITY): Payer: Self-pay

## 2017-07-04 ENCOUNTER — Encounter (HOSPITAL_COMMUNITY): Payer: Self-pay | Admitting: Emergency Medicine

## 2017-07-04 ENCOUNTER — Emergency Department (HOSPITAL_COMMUNITY)
Admission: EM | Admit: 2017-07-04 | Discharge: 2017-07-04 | Disposition: A | Discharge status: Home / Self Care | Payer: Self-pay | Attending: Emergency Medicine | Admitting: Emergency Medicine

## 2017-07-04 ENCOUNTER — Other Ambulatory Visit: Payer: Self-pay

## 2017-07-04 DIAGNOSIS — S0083XA Contusion of other part of head, initial encounter: Secondary | ICD-10-CM

## 2017-07-04 DIAGNOSIS — M79605 Pain in left leg: Secondary | ICD-10-CM

## 2017-07-04 LAB — I-STAT CHEM 8, ED
BUN: 12 mg/dL (ref 6–20)
CALCIUM ION: 1.2 mmol/L (ref 1.15–1.40)
Chloride: 102 mmol/L (ref 101–111)
Creatinine, Ser: 0.9 mg/dL (ref 0.44–1.00)
GLUCOSE: 98 mg/dL (ref 65–99)
HCT: 35 % — ABNORMAL LOW (ref 36.0–46.0)
Hemoglobin: 11.9 g/dL — ABNORMAL LOW (ref 12.0–15.0)
Potassium: 3.6 mmol/L (ref 3.5–5.1)
SODIUM: 140 mmol/L (ref 135–145)
TCO2: 28 mmol/L (ref 22–32)

## 2017-07-04 LAB — I-STAT BETA HCG BLOOD, ED (MC, WL, AP ONLY): I-stat hCG, quantitative: <5 m[IU]/mL (ref <5)

## 2017-07-04 MED ORDER — IOPAMIDOL (ISOVUE-370) INJECTION 76%
100.0000 mL | Freq: Once | INTRAVENOUS | Status: AC | PRN
  Administered 2017-07-04: 100 mL via INTRAVENOUS

## 2017-07-04 MED ORDER — SODIUM CHLORIDE 0.9 % IJ SOLN
INTRAMUSCULAR | Status: AC
  Filled 2017-07-04: qty 50

## 2017-07-04 MED ORDER — IBUPROFEN 800 MG PO TABS: 800.0000 mg | ORAL_TABLET | Freq: Three times a day (TID) | ORAL | 0 refills | Status: DC

## 2017-07-04 MED ORDER — IBUPROFEN 800 MG PO TABS
800.0000 mg | ORAL_TABLET | Freq: Once | ORAL | Status: AC
  Administered 2017-07-04: 800 mg via ORAL
  Filled 2017-07-04: qty 1

## 2017-07-04 MED ORDER — IOPAMIDOL (ISOVUE-370) INJECTION 76%
INTRAVENOUS | Status: AC
  Filled 2017-07-04: qty 100
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
## 2017-07-04 NOTE — ED Notes (Signed)
Pt. Stated she is on blood pressure medicine at home every morning,labetalol 200mg , that her latest BP is like her normal range of BP. Father at bedside and confirmed. Wanted to be leave ASAP.
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
## 2017-07-04 NOTE — Discharge Instructions (Signed)
1. Medications: alternate naprosyn and tylenol for pain control, usual home medications 2. Treatment: rest, ice, elevate, drink plenty of fluids, gentle stretching 3. Follow Up: Please followup with your PCP in 1 week if no improvement for discussion of your diagnoses and further evaluation after today's visit; if you do not have a primary care doctor use the resource guide provided to find one; Please return to the ER for worsening symptoms, vision changes, vomiting, difficulty breathing, passing out or other concerns

## 2017-12-16 ENCOUNTER — Ambulatory Visit (HOSPITAL_COMMUNITY)
Admission: EM | Admit: 2017-12-16 | Discharge: 2017-12-16 | Disposition: A | Discharge status: Home / Self Care | Payer: Self-pay | Attending: Family Medicine | Admitting: Family Medicine

## 2017-12-16 ENCOUNTER — Encounter (HOSPITAL_COMMUNITY): Payer: Self-pay

## 2017-12-16 DIAGNOSIS — R509 Fever, unspecified: Secondary | ICD-10-CM | POA: Insufficient documentation

## 2017-12-16 DIAGNOSIS — Z8619 Personal history of other infectious and parasitic diseases: Secondary | ICD-10-CM | POA: Insufficient documentation

## 2017-12-16 DIAGNOSIS — N3000 Acute cystitis without hematuria: Secondary | ICD-10-CM

## 2017-12-16 DIAGNOSIS — M791 Myalgia, unspecified site: Secondary | ICD-10-CM | POA: Insufficient documentation

## 2017-12-16 DIAGNOSIS — Z8249 Family history of ischemic heart disease and other diseases of the circulatory system: Secondary | ICD-10-CM | POA: Insufficient documentation

## 2017-12-16 DIAGNOSIS — I10 Essential (primary) hypertension: Secondary | ICD-10-CM | POA: Insufficient documentation

## 2017-12-16 DIAGNOSIS — D573 Sickle-cell trait: Secondary | ICD-10-CM | POA: Insufficient documentation

## 2017-12-16 LAB — POCT URINALYSIS DIP (DEVICE)
Bilirubin Urine: NEGATIVE
GLUCOSE, UA: NEGATIVE mg/dL
Ketones, ur: NEGATIVE mg/dL
Nitrite: POSITIVE — AB
Protein, ur: NEGATIVE mg/dL
Specific Gravity, Urine: 1.015 (ref 1.005–1.030)
Urobilinogen, UA: 0.2 mg/dL (ref 0.0–1.0)
pH: 5.5 (ref 5.0–8.0)

## 2017-12-16 MED ORDER — CEPHALEXIN 500 MG PO CAPS: 500.0000 mg | ORAL_CAPSULE | Freq: Three times a day (TID) | ORAL | 0 refills | Status: DC

## 2017-12-16 NOTE — ED Triage Notes (Signed)
Pt presents with generalized body aches and ongoing fever

## 2017-12-16 NOTE — ED Provider Notes (Signed)
[facility]   [id number] 12/16/17 Arrival Time: 1458   SUBJECTIVE:  Becky Torres is a 27 y.o. female who presents to the urgent care with complaint of fever and generalized myalgia.   Patient has noted dark urine.  There is been no sore throat, cough, rash, upset stomach.     Past Medical History:  Diagnosis Date  . Anemia   . Chlamydia   . Hypertension    chronic, diagnosed at age 27, takes Labetalol  . Sickle cell trait (HCC)    Family History  Problem Relation Age of Onset  . Hypertension Mother   . Hypertension Brother   . Hypertension Maternal Aunt   . Hypertension Maternal Grandmother    Social History   Socioeconomic History  . Marital status: Single    Spouse name: Not on file  . Number of children: Not on file  . Years of education: Not on file  . Highest education level: Not on file  Occupational History  . Not on file  Social Needs  . Financial resource strain: Not on file  . Food insecurity:    Worry: Not on file    Inability: Not on file  . Transportation needs:    Medical: Not on file    Non-medical: Not on file  Tobacco Use  . Smoking status: Never Smoker  . Smokeless tobacco: Never Used  Substance and Sexual Activity  . Alcohol use: No  . Drug use: No  . Sexual activity: Yes    Birth control/protection: None  Lifestyle  . Physical activity:    Days per week: Not on file    Minutes per session: Not on file  . Stress: Not on file  Relationships  . Social connections:    Talks on phone: Not on file    Gets together: Not on file    Attends religious service: Not on file    Active member of club or organization: Not on file    Attends meetings of clubs or organizations: Not on file    Relationship status: Not on file  . Intimate partner violence:    Fear of current or ex partner: Not on file    Emotionally abused: Not on file    Physically abused: Not on file    Forced sexual activity: Not on file  Other Topics Concern    . Not on file  Social History Narrative  . Not on file   No outpatient medications have been marked as taking for the 12/16/17 encounter [facility](Hospital Encounter).   No Known Allergies    ROS: As per HPI, remainder of ROS negative.   OBJECTIVE:   Vitals:   12/16/17 1520  BP: (!) 148/96  Pulse: (!) 120  Resp: 20  Temp: (!) 100.6 F (38.1 C)  TempSrc: Temporal  SpO2: 100%     General appearance: alert; no distress Eyes: PERRL; EOMI; conjunctiva normal HENT: normocephalic; atraumatic; TMs normal, canal normal, external ears normal without trauma;oral mucosa normal although tongue has papular changes. Neck: supple Lungs: clear to auscultation bilaterally Heart: regular rate and rhythm Abdomen: soft, non-tender; bowel sounds normal; no masses or organomegaly; no guarding or rebound tenderness Back: no CVA tenderness Extremities: no cyanosis or edema; symmetrical with no gross deformities Skin: warm and dry Neurologic: normal gait; grossly normal Psychological: alert and cooperative; normal mood and affect    Labs:  Results for orders placed or performed during the hospital encounter of 07/04/17  I-stat chem 8, ed  Result Value Ref Range   Sodium 140 135 - 145 mmol/L   Potassium 3.6 3.5 - 5.1 mmol/L   Chloride 102 101 - 111 mmol/L   BUN 12 6 - 20 mg/dL   Creatinine, Ser 4.090.90 0.44 - 1.00 mg/dL   Glucose, Bld 98 65 - 99 mg/dL   Calcium, Ion 8.111.20 1.15 - 1.40 mmol/L   TCO2 28 22 - 32 mmol/L   Hemoglobin 11.9 (L) 12.0 - 15.0 g/dL   HCT 91.435.0 (L) 78.236.0 - 95.646.0 %  I-Stat Beta hCG blood, ED ([facility], [facility], [facility] only)  Result Value Ref Range   I-stat hCG, quantitative <5.0 <5 mIU/mL   Comment 3            Labs Reviewed - No data to display  No results found.     ASSESSMENT & PLAN:  No diagnosis found.  No orders of the defined types were placed in this encounter.   Reviewed expectations re: course of current medical issues. Questions answered. Outlined signs and  symptoms indicating need for more acute intervention. Patient verbalized understanding. After Visit Summary given.    Procedures:      Elvina SidleLauenstein, Quenisha Lovins, MD 12/16/17 1600

## 2017-12-18 LAB — URINE CULTURE: Culture: >=100000 — AB
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
## 2019-11-08 ENCOUNTER — Other Ambulatory Visit: Payer: Self-pay

## 2019-11-08 ENCOUNTER — Encounter (HOSPITAL_COMMUNITY): Payer: Self-pay | Admitting: Emergency Medicine

## 2019-11-08 ENCOUNTER — Emergency Department (HOSPITAL_COMMUNITY)
Admission: EM | Admit: 2019-11-08 | Discharge: 2019-11-08 | Disposition: A | Discharge status: Home / Self Care | Payer: Self-pay | Attending: Emergency Medicine | Admitting: Emergency Medicine

## 2019-11-08 DIAGNOSIS — N3090 Cystitis, unspecified without hematuria: Secondary | ICD-10-CM | POA: Insufficient documentation

## 2019-11-08 DIAGNOSIS — I10 Essential (primary) hypertension: Secondary | ICD-10-CM | POA: Insufficient documentation

## 2019-11-08 LAB — URINALYSIS, ROUTINE W REFLEX MICROSCOPIC
Bilirubin Urine: NEGATIVE
Glucose, UA: NEGATIVE mg/dL
Hgb urine dipstick: NEGATIVE
Ketones, ur: NEGATIVE mg/dL
Nitrite: POSITIVE — AB
Protein, ur: NEGATIVE mg/dL
Specific Gravity, Urine: 1.01 (ref 1.005–1.030)
pH: 6 (ref 5.0–8.0)

## 2019-11-08 MED ORDER — CEPHALEXIN 500 MG PO CAPS
500.0000 mg | ORAL_CAPSULE | Freq: Once | ORAL | Status: AC
  Administered 2019-11-08: 500 mg via ORAL
  Filled 2019-11-08: qty 1

## 2019-11-08 MED ORDER — CEPHALEXIN 500 MG PO CAPS: 500.0000 mg | ORAL_CAPSULE | Freq: Two times a day (BID) | ORAL | 0 refills | Status: AC

## 2019-11-08 NOTE — ED Provider Notes (Signed)
[facility] EMERGENCY DEPARTMENT Provider Note   CSN: [id number] Arrival date & time: 11/08/19  1758     History Chief Complaint  Patient presents with   possible UTI    Becky Torres is a 29 y.o. female presented to the ED with dysuria.  This been going on for several days.  She has a PCP appointment coming up with [place] try to get things sorted out with her insurance, was unable to see them.  She is here is the mother of her son, he was also checked into the ER, and states that she want to get checked out for her dysuria while she is here.  She denies any burning pain, fevers, chills, nausea or vomiting.  She reports a distant history of one UTI in the past but says she had fevers at that time.  She otherwise has been healthy and does not take any chronic medications.  HPI     Past Medical History:  Diagnosis Date   Anemia    Chlamydia    Hypertension    chronic, diagnosed at age 54, takes Labetalol   Sickle cell trait [facility])     Patient Active Problem List   Diagnosis Date Noted   SVD (spontaneous vaginal delivery) 12/31/2016   Supervision of other normal pregnancy, antepartum 09/30/2016   Late prenatal care 09/19/2012   Supervision of other normal pregnancy 07/24/2012   Chlamydia trachomatis infection in pregnancy 07/24/2012   Hypertension 07/24/2012   Hypertension in pregnancy, antepartum 07/24/2012   Short cervix affecting pregnancy 07/01/2012   Preterm uterine contractions, antepartum 07/01/2012    History reviewed. No pertinent surgical history.   OB History    Gravida  4   Para  3   Term  3   Preterm  0   AB  1   Living  3     SAB  1   TAB  0   Ectopic  0   Multiple  0   Live Births  3           Family History  Problem Relation Age of Onset   Hypertension Mother    Hypertension Brother    Hypertension Maternal Aunt    Hypertension Maternal Grandmother     Social History   Tobacco Use   Smoking status: Never  Smoker   Smokeless tobacco: Never Used  Substance Use Topics   Alcohol use: No   Drug use: No    Home Medications Prior to Admission medications   Medication Sig Start Date End Date Taking? Authorizing Provider  acetaminophen (TYLENOL) 325 MG tablet Take 2 tablets (650 mg total) by mouth every 4 (four) hours as needed (for pain scale < 4). 01/01/17  Yes Arabella Merles, CNM  cephALEXin (KEFLEX) 500 MG capsule Take 1 capsule (500 mg total) by mouth 3 (three) times daily. Patient not taking: Reported on 11/08/2019 12/16/17   Elvina Sidle, MD  cephALEXin (KEFLEX) 500 MG capsule Take 1 capsule (500 mg total) by mouth 2 (two) times daily for 5 days. 11/09/19 11/14/19  Terald Sleeper, MD  ibuprofen (ADVIL,MOTRIN) 800 MG tablet Take 1 tablet (800 mg total) by mouth 3 (three) times daily. Patient not taking: Reported on 11/08/2019 07/04/17   Muthersbaugh, Dahlia Client, PA-C    Allergies    Patient has no known allergies.  Review of Systems   Review of Systems  Constitutional: Negative for chills and fever.  Respiratory: Negative for cough and shortness of breath.  Cardiovascular: Negative for chest pain and palpitations.  Gastrointestinal: Negative for abdominal pain and vomiting.  Genitourinary: Positive for difficulty urinating and dysuria. Negative for hematuria.  Musculoskeletal: Negative for arthralgias and back pain.  Skin: Negative for color change and rash.  Neurological: Negative for syncope and headaches.  All other systems reviewed and are negative.   Physical Exam Updated Vital Signs BP (!) 142/74 (BP Location: Right Arm)    Pulse 74    Temp 98.1 F (36.7 C) (Oral)    Resp 16    Ht 5\' 3"  (1.6 m)    Wt 81.2 kg    LMP 10/18/2019 (Approximate)    SpO2 98%    BMI 31.71 kg/m   Physical Exam Vitals and nursing note reviewed.  Constitutional:      General: She is not in acute distress.    Appearance: She is well-developed.  HENT:     Head: Normocephalic and atraumatic.  Eyes:      Conjunctiva/sclera: Conjunctivae normal.  Cardiovascular:     Rate and Rhythm: Normal rate and regular rhythm.  Pulmonary:     Effort: Pulmonary effort is normal. No respiratory distress.  Musculoskeletal:     Cervical back: Neck supple.  Skin:    General: Skin is warm and dry.  Neurological:     General: No focal deficit present.     Mental Status: She is alert and oriented to person, place, and time.  Psychiatric:        Mood and Affect: Mood normal.        Behavior: Behavior normal.     ED Results / Procedures / Treatments   Labs (all labs ordered are listed, but only abnormal results are displayed) Labs Reviewed  URINALYSIS, ROUTINE W REFLEX MICROSCOPIC - Abnormal; Notable for the following components:      Result Value   APPearance HAZY (*)    Nitrite POSITIVE (*)    Leukocytes,Ua SMALL (*)    Bacteria, UA RARE (*)    All other components within normal limits  URINE CULTURE    EKG None  Radiology No results found.  Procedures Procedures (including critical care time)  Medications Ordered in ED Medications  cephALEXin (KEFLEX) capsule 500 mg (500 mg Oral Given 11/08/19 2040)    ED Course  I have reviewed the triage vital signs and the nursing notes.  Pertinent labs & imaging results that were available during my care of the patient were reviewed by me and considered in my medical decision making (see chart for details).  29 yo female here with dysuria No evidence of sepsis or pyelonephritis Vitals wnl Symptoms ongoing for many days, cannot see PCP due to insurance issues  UA suggestive of UTI, will tx with keflex     Final Clinical Impression(s) / ED Diagnoses Final diagnoses:  Cystitis    Rx / DC Orders ED Discharge Orders         Ordered    cephALEXin (KEFLEX) 500 MG capsule  2 times daily     Discontinue  Reprint     11/08/19 2028           2029, MD 11/09/19 [phone]

## 2019-11-08 NOTE — ED Triage Notes (Signed)
Patient c/o increased frequency and inability to empty bladder with urination.

## 2019-11-11 LAB — URINE CULTURE: Culture: >=100000 — AB

## 2021-04-11 ENCOUNTER — Other Ambulatory Visit: Payer: Self-pay

## 2021-04-11 ENCOUNTER — Inpatient Hospital Stay (HOSPITAL_COMMUNITY)
Admission: AD | Admit: 2021-04-11 | Discharge: 2021-04-11 | Disposition: A | Discharge status: Home / Self Care | Payer: Medicaid Other | Attending: Obstetrics & Gynecology | Admitting: Obstetrics & Gynecology

## 2021-04-11 DIAGNOSIS — N926 Irregular menstruation, unspecified: Secondary | ICD-10-CM

## 2021-04-11 DIAGNOSIS — Z049 Encounter for examination and observation for unspecified reason: Secondary | ICD-10-CM | POA: Insufficient documentation

## 2021-04-11 NOTE — MAU Provider Note (Signed)
Event Date/Time   First Provider Initiated Contact with Patient 04/11/21 2041      S [name] is a 30 y.o. [phone] patient who presents to MAU today with complaint of missed menses. LMP was 10/30. Reports normal monthly periods. Has not been on contraception in years. Had negative pregnancy test at home as recently as yesterday. Denies abdominal pain or vaginal bleeding.   O BP (!) 159/98 (BP Location: Right Arm)   Pulse 97   Temp 98.6 F (37 C) (Oral)   Resp 17   Ht 5\' 4"  (1.626 m)   Wt 101.9 kg   SpO2 100%   BMI 38.57 kg/m  Physical Exam Vitals and nursing note reviewed.  Constitutional:      General: She is not in acute distress.    Appearance: Normal appearance. She is not ill-appearing.  HENT:     Head: Normocephalic and atraumatic.  Eyes:     General: No scleral icterus.    Conjunctiva/sclera: Conjunctivae normal.     Pupils: Pupils are equal, round, and reactive to light.  Pulmonary:     Effort: Pulmonary effort is normal. No respiratory distress.  Neurological:     General: No focal deficit present.     Mental Status: She is alert.  Psychiatric:        Mood and Affect: Mood normal.        Behavior: Behavior normal.    A Medical screening exam complete Missed menses - Plan: Discharge patient   P Discharge from MAU in stable condition Informed that we don't do pregnancy verifications in the MAU Patient previously seen at [facility] but now lives in [place] and [facility] no [facility] in 3 months    [place], NP 04/11/2021 8:41 PM

## 2021-04-11 NOTE — MAU Note (Signed)
...  Becky Torres is a 30 y.o. at Unknown here in MAU reporting: Patient states her LMP was 03/01/2021 and has not had a period since. She states she took two pregnancy tests and they were both negative. She would like to confirm whether or not she is pregnant. No VB and denies pain.   BP: 159/98 P: 97 T: 98.6 F oral R: 19 O2: 100%

## 2021-05-03 NOTE — L&D Delivery Note (Signed)
Delivery Note Becky Torres is a 31 y.o. [id number] at [redacted]w[redacted]d admitted for SOL.   GBS Status: Unknown, treated with Penicillin G x4 doses.  Maximum Maternal Temperature: 98.6  Labor course: Initial SVE: 3.5/50/ . Augmentation with: AROM and Pitocin. She then progressed to complete.  ROM: 5h 39m with clear fluid  Birth: At 1425 a viable female was delivered via spontaneous vaginal delivery (Presentation: LOA). Nuchal cord present: No.  Shoulders and body delivered in usual fashion. Infant placed directly on mom's abdomen for bonding/skin-to-skin, baby dried and stimulated. Cord clamped x 2 after 1 minute and cut by FOB.  Cord blood collected.  The placenta separated spontaneously and delivered via gentle cord traction.  Pitocin infused rapidly IV per protocol.  Fundus firm with massage.  Placenta inspected and appears to be intact with a 3 VC.  Placenta/Cord with the following complications: N/a .   Sponge and instrument count were correct x2.  Intrapartum complications:  Preterm Labor Anesthesia:  epidural Episiotomy: none Lacerations:  Perineal abrasion, well approximated and hemostatic. Suture Repair: N/a EBL (mL): 200   Infant: APGAR (1 MIN): 9   APGAR (5 MINS): 9   APGAR (10 MINS):    Infant weight: pending  Mom to postpartum.  Baby to Couplet care / Skin to Skin. Placenta to L&D   Plans to Breastfeed Contraception: tubal ligation Circumcision: wants inpatient  Note sent to [facility]: [facility] for pp visit.  Owens Loffler, SNM 02/19/2022 2:50 PM

## 2021-07-08 ENCOUNTER — Other Ambulatory Visit: Payer: Self-pay

## 2021-07-08 ENCOUNTER — Inpatient Hospital Stay (HOSPITAL_COMMUNITY)
Admission: AD | Admit: 2021-07-08 | Discharge: 2021-07-08 | Disposition: A | Discharge status: Home / Self Care | Payer: Medicaid Other | Attending: Obstetrics & Gynecology | Admitting: Obstetrics & Gynecology

## 2021-07-08 ENCOUNTER — Encounter (HOSPITAL_COMMUNITY): Payer: Self-pay

## 2021-07-08 DIAGNOSIS — N926 Irregular menstruation, unspecified: Secondary | ICD-10-CM | POA: Insufficient documentation

## 2021-07-08 NOTE — MAU Provider Note (Signed)
Event Date/Time  ? First Provider Initiated Contact with Patient 07/08/21 1848   ?  ? ?S ?[name] is a 31 y.o. [phone] patient who presents to MAU today for pregnancy confirmation to support her change in insurance. She denies pregnancy-related complaints. ? ?O ?BP 137/85 (BP Location: Right Arm)   Pulse 95   Temp 98.3 ?F (36.8 ?C) (Oral)   Resp 16   Ht 5\' 4"  (1.626 m)   Wt 99.7 kg   LMP 06/08/2021   SpO2 100% Comment: room air  BMI 37.75 kg/m?   ? ?Physical Exam ?Vitals and nursing note reviewed.  ?Constitutional:   ?   Appearance: Normal appearance.  ?Cardiovascular:  ?   Rate and Rhythm: Normal rate.  ?Pulmonary:  ?   Effort: Pulmonary effort is normal.  ?Neurological:  ?   Mental Status: She is alert and oriented to person, place, and time.  ?Psychiatric:     ?   Mood and Affect: Mood normal.     ?   Behavior: Behavior normal.     ?   Thought Content: Thought content normal.     ?   Judgment: Judgment normal.  ? ?A ?Medical screening exam complete ?No pregnancy-related complaints ? ?P ?Discharge from MAU in stable condition ? ?F/U: ?Patient may obtain pregnancy confirmation on a walk-in basis at any [facility] office ?Given address for [facility] ? ?[facility], [place] ?07/08/2021 7:01 PM  ? ?

## 2021-07-08 NOTE — MAU Note (Signed)
Becky Torres is a 31 y.o. here in MAU reporting: had 2 + UPT at home. States she needs to have pregnancy test for medicaid.  ? ?LMP: 06/08/21 ? ?Onset of complaint: yesterday ? ?Pain score: 0/10 ? ?Vitals:  ? 07/08/21 1846  ?BP: 137/85  ?Pulse: 95  ?Resp: 16  ?Temp: 98.3 ?F (36.8 ?C)  ?SpO2: 100%  ?   ?Lab orders placed from triage: none ? ?

## 2021-07-19 DIAGNOSIS — R102 Pelvic and perineal pain: Secondary | ICD-10-CM | POA: Diagnosis not present

## 2021-07-19 DIAGNOSIS — O209 Hemorrhage in early pregnancy, unspecified: Secondary | ICD-10-CM | POA: Diagnosis not present

## 2021-07-19 DIAGNOSIS — Z3A01 Less than 8 weeks gestation of pregnancy: Secondary | ICD-10-CM | POA: Diagnosis not present

## 2021-07-19 DIAGNOSIS — O26891 Other specified pregnancy related conditions, first trimester: Secondary | ICD-10-CM | POA: Diagnosis not present

## 2021-07-20 ENCOUNTER — Encounter (HOSPITAL_COMMUNITY): Payer: Self-pay | Admitting: Obstetrics & Gynecology

## 2021-07-20 ENCOUNTER — Other Ambulatory Visit: Payer: Self-pay

## 2021-07-20 ENCOUNTER — Inpatient Hospital Stay (HOSPITAL_COMMUNITY)
Admission: AD | Admit: 2021-07-20 | Discharge: 2021-07-20 | Disposition: A | Discharge status: Home / Self Care | Payer: Medicaid Other | Attending: Obstetrics & Gynecology | Admitting: Obstetrics & Gynecology

## 2021-07-20 DIAGNOSIS — N939 Abnormal uterine and vaginal bleeding, unspecified: Secondary | ICD-10-CM

## 2021-07-20 DIAGNOSIS — Z3A01 Less than 8 weeks gestation of pregnancy: Secondary | ICD-10-CM | POA: Insufficient documentation

## 2021-07-20 DIAGNOSIS — O3680X0 Pregnancy with inconclusive fetal viability, not applicable or unspecified: Secondary | ICD-10-CM | POA: Insufficient documentation

## 2021-07-20 DIAGNOSIS — O26851 Spotting complicating pregnancy, first trimester: Secondary | ICD-10-CM | POA: Insufficient documentation

## 2021-07-20 DIAGNOSIS — Z6790 Unspecified blood type, Rh positive: Secondary | ICD-10-CM | POA: Insufficient documentation

## 2021-07-20 LAB — URINALYSIS, ROUTINE W REFLEX MICROSCOPIC
Bilirubin Urine: NEGATIVE
Glucose, UA: NEGATIVE mg/dL
Hgb urine dipstick: NEGATIVE
Ketones, ur: NEGATIVE mg/dL
Nitrite: NEGATIVE
Protein, ur: NEGATIVE mg/dL
Specific Gravity, Urine: 1.014 (ref 1.005–1.030)
pH: 6 (ref 5.0–8.0)

## 2021-07-20 LAB — POCT PREGNANCY, URINE: Preg Test, Ur: POSITIVE — AB

## 2021-07-20 NOTE — MAU Provider Note (Signed)
History ?ALEJANDRO ADCOX is a 31 y.o. [id number] at [redacted]w[redacted]d who presents for follow up. Had pink vaginal spotting over the weekend. No bleeding since Saturday. Denies abdominal pain. Was seen at an ED in [place] last night & was told to follow up when she came back home. Is scheduled for prenatal care at [facility] next month. Currently no complaints.  ? ?Physical exam ?Patient Vitals for the past 24 hrs: ? BP Temp Temp src Pulse Resp SpO2 Height Weight  ?07/20/21 1711 136/90 98 ?F (36.7 ?C) Oral 82 16 100 % 5\' 4"  (1.626 m) 101 kg  ? ?Physical Examination: General appearance - alert, well appearing, and in no distress ?Mental status - normal mood, behavior, speech, dress, motor activity, and thought processes ?Eyes - sclera anicteric ?Chest - normal respiratory effort ? ?Assessment/Plan ?1. Pregnancy with inconclusive fetal viability, single or unspecified fetus  ?-Reviewed Care Everywhere [facility] ED visit on 3/19. Patient had ultrasound that showed IUGS with yolk sac. ?-No complaints today - will ordered outpatient viability scan in 10-14 days  ?2. Vaginal spotting  ?-Currently asymptomatic ?-RH positive ?-Reviewed bleeding precautions & reasons to return to MAU  ?3. [redacted] weeks gestation of pregnancy   ? ? ?01-26-1979, NP  ? ? ? ?

## 2021-07-20 NOTE — Discharge Instructions (Signed)
Return to care  If you have heavier bleeding that soaks through more than 2 pads per hour for an hour or more If you bleed so much that you feel like you might pass out or you do pass out If you have significant abdominal pain that is not improved with Tylenol   

## 2021-07-20 NOTE — MAU Note (Signed)
Becky Torres is a 31 y.o. at Unknown here in MAU reporting: was out of town. On Saturday night, started spotting.  Went to hosp on Sun night.  Had blood work and [place]. Hasn't bled since. Feels a little pressure, like she needs to pee.  ?LMP: 2/6 ?Onset of complaint: Sat ?Pain score: 2 ?Vitals:  ? 07/20/21 1711  ?BP: 136/90  ?Pulse: 82  ?Resp: 16  ?Temp: 98 ?F (36.7 ?C)  ?SpO2: 100%  ?   ? ?Lab orders placed from triage:  urine preg ?

## 2021-07-24 LAB — CULTURE, OB URINE
Culture: 40000 — AB
Special Requests: NORMAL

## 2021-07-30 ENCOUNTER — Ambulatory Visit
Admission: RE | Admit: 2021-07-30 | Discharge: 2021-07-30 | Disposition: A | Discharge status: Home / Self Care | Payer: Medicaid Other | Source: Ambulatory Visit | Attending: Student | Admitting: Student

## 2021-07-30 DIAGNOSIS — N939 Abnormal uterine and vaginal bleeding, unspecified: Secondary | ICD-10-CM | POA: Insufficient documentation

## 2021-07-30 DIAGNOSIS — Z3A01 Less than 8 weeks gestation of pregnancy: Secondary | ICD-10-CM | POA: Diagnosis not present

## 2021-07-30 DIAGNOSIS — O3680X0 Pregnancy with inconclusive fetal viability, not applicable or unspecified: Secondary | ICD-10-CM | POA: Diagnosis not present

## 2021-08-08 ENCOUNTER — Inpatient Hospital Stay (HOSPITAL_COMMUNITY)
Admission: AD | Admit: 2021-08-08 | Discharge: 2021-08-09 | Disposition: A | Discharge status: Home / Self Care | Payer: Medicaid Other | Attending: Obstetrics and Gynecology | Admitting: Obstetrics and Gynecology

## 2021-08-08 ENCOUNTER — Encounter (HOSPITAL_COMMUNITY): Payer: Self-pay | Admitting: Obstetrics and Gynecology

## 2021-08-08 ENCOUNTER — Other Ambulatory Visit: Payer: Self-pay

## 2021-08-08 DIAGNOSIS — Z3A01 Less than 8 weeks gestation of pregnancy: Secondary | ICD-10-CM | POA: Diagnosis not present

## 2021-08-08 DIAGNOSIS — O219 Vomiting of pregnancy, unspecified: Secondary | ICD-10-CM | POA: Diagnosis not present

## 2021-08-08 LAB — URINALYSIS, ROUTINE W REFLEX MICROSCOPIC
Bilirubin Urine: NEGATIVE
Glucose, UA: NEGATIVE mg/dL
Hgb urine dipstick: NEGATIVE
Ketones, ur: 20 mg/dL — AB
Nitrite: NEGATIVE
Protein, ur: NEGATIVE mg/dL
Specific Gravity, Urine: 1.02 (ref 1.005–1.030)
pH: 5 (ref 5.0–8.0)

## 2021-08-08 MED ORDER — ONDANSETRON 4 MG PO TBDP
8.0000 mg | ORAL_TABLET | Freq: Once | ORAL | Status: AC
  Administered 2021-08-08: 8 mg via ORAL
  Filled 2021-08-08: qty 2

## 2021-08-08 NOTE — MAU Note (Signed)
.  Becky Torres is a 31 y.o. at [redacted]w[redacted]d here in MAU reporting: not able to keep anything down since Monday. Vomiting and spitting. [facility] appointment [facility].  ?LMP: 06/08/21 ?Onset of complaint: Monday ?Pain score: 0 ?Vitals:  ? 08/08/21 2216  ?BP: (!) 130/99  ?Pulse: (!) 105  ?Resp: 18  ?Temp: 98.7 ?F (37.1 ?C)  ?   ?FHT:n/a ?Lab orders placed from triage:  u/a ? ?

## 2021-08-09 DIAGNOSIS — O219 Vomiting of pregnancy, unspecified: Secondary | ICD-10-CM | POA: Diagnosis not present

## 2021-08-09 DIAGNOSIS — Z3A01 Less than 8 weeks gestation of pregnancy: Secondary | ICD-10-CM

## 2021-08-09 MED ORDER — DOXYLAMINE-PYRIDOXINE 10-10 MG PO TBEC: 1.0000 | DELAYED_RELEASE_TABLET | Freq: Every day | ORAL | 0 refills | Status: DC

## 2021-08-09 MED ORDER — LACTATED RINGERS IV BOLUS
1000.0000 mL | Freq: Once | INTRAVENOUS | Status: AC
  Administered 2021-08-09: 1000 mL via INTRAVENOUS

## 2021-08-09 MED ORDER — PROMETHAZINE HCL 25 MG/ML IJ SOLN
6.2500 mg | Freq: Once | INTRAMUSCULAR | Status: AC
  Administered 2021-08-09: 6.25 mg via INTRAMUSCULAR
  Filled 2021-08-09: qty 1

## 2021-08-09 MED ORDER — ONDANSETRON HCL 4 MG PO TABS: 4.0000 mg | ORAL_TABLET | Freq: Every day | ORAL | 0 refills | Status: DC | PRN

## 2021-08-09 NOTE — Discharge Instructions (Addendum)
You came to the MAU because you had nausea and vomiting in early pregnancy. It is common to have nausea and vomiting in early pregnancy because of the hormone levels in your body. We gave you a shot and IV fluids and you felt better. We sent you medications to your pharmacy for nausea. You can take the Diclegis as follows: ?Two tablets at bedtime on days 1 and 2; if symptoms persist, take 1 tablet in morning and 2 tablets at bedtime on day 3; if symptoms persist, may further increase to 1 tablet in morning, 1 tablet mid-afternoon, and 2 tablets at bedtime on day 4 (maximum:  is 4 tables total per day).  ? ?If the Diclegis isn't fully relieving your symptoms you can take Zofran as needed in between. ? ?Please seek medical care if you feel dizzy or lightheaded or have not been able to keep any liquid or food down for multiple days in a row. ?

## 2021-08-09 NOTE — MAU Provider Note (Signed)
?History  ?  ? ?CSN: [id number] ? ?Arrival date and time: 08/08/21 2117 ? ? None  ?  ? ?Chief Complaint  ?Patient presents with  ? Emesis  ? Nausea  ? ?HPI ?31 yo F Becky Torres at [redacted]w[redacted]d presents after 5 days of  nausea and vomiting ? ?Hasn't tried any medication for it ?Also having increased saliva ?Last fluid intake 10AM today ?No dizziness ?Feels weak but no lightheadedness ?Last able to keep meal down last Monday ? ? ? ? ?OB History   ? ? Gravida  ?5  ? Para  ?3  ? Term  ?3  ? Preterm  ?0  ? AB  ?1  ? Living  ?3  ?  ? ? SAB  ?1  ? IAB  ?0  ? Ectopic  ?0  ? Multiple  ?0  ? Live Births  ?3  ?   ?  ?  ? ? ?Past Medical History:  ?Diagnosis Date  ? Anemia   ? Chlamydia   ? Hypertension   ? chronic, diagnosed at age 34, takes Labetalol  ? Sickle cell trait ([place])   ? ? ?History reviewed. No pertinent surgical history. ? ?Family History  ?Problem Relation Age of Onset  ? Hypertension Mother   ? Hypertension Brother   ? Hypertension Maternal Aunt   ? Hypertension Maternal Grandmother   ? ? ?Social History  ? ?Tobacco Use  ? Smoking status: Never  ? Smokeless tobacco: Never  ?Substance Use Topics  ? Alcohol use: No  ? Drug use: No  ? ? ?Allergies: No Known Allergies ? ?Medications Prior to Admission  ?Medication Sig Dispense Refill Last Dose  ? acetaminophen (TYLENOL) 325 MG tablet Take 2 tablets (650 mg total) by mouth every 4 (four) hours as needed (for pain scale < 4).   Past Week  ? Prenat-Fe Carbonyl-FA-Omega 3 (ONE-A-DAY WOMENS PRENATAL 1) 28-0.8-235 MG CAPS Take 1 capsule by mouth daily.   08/08/2021  ? ? ?Review of Systems  ?Constitutional:  Negative for chills and fever.  ?Respiratory:  Negative for shortness of breath.   ?Cardiovascular:  Negative for chest pain.  ?Gastrointestinal:  Positive for nausea and vomiting. Negative for abdominal pain, constipation and diarrhea.  ?Endocrine: Negative for polyuria.  ?Genitourinary:  Negative for dysuria.  ?Musculoskeletal:  Negative for back pain.  ?Neurological:  Negative for  light-headedness.  ?Physical Exam  ? ?Blood pressure (!) 133/97, pulse 92, temperature 97.9 ?F (36.6 ?C), temperature source Oral, resp. rate 17, height 5\' 4"  (1.626 m), weight 96.2 kg, last menstrual period 06/08/2021, SpO2 100 %. ? ?Physical Exam ?Vitals and nursing note reviewed.  ?HENT:  ?   Head: Normocephalic.  ?Cardiovascular:  ?   Rate and Rhythm: Normal rate.  ?   Pulses: Normal pulses.  ?Pulmonary:  ?   Effort: Pulmonary effort is normal.  ?Abdominal:  ?   General: Abdomen is flat. There is no distension.  ?   Tenderness: There is no abdominal tenderness.  ?Musculoskeletal:     ?   General: Normal range of motion.  ?Skin: ?   General: Skin is warm.  ?   Capillary Refill: Capillary refill takes less than 2 seconds.  ?Neurological:  ?   General: No focal deficit present.  ?   Mental Status: She is alert and oriented to person, place, and time.  ? ? ?MAU Course  ?Procedures ? ?MDM ?31 yo F Becky Torres at [redacted]w[redacted]d presents after 5 days of  nausea and vomiting. Per  patient has not been able to keep any food down. Treated with Phenergan and IV fluids. Sx improved. Sent Diclegis to patient's pharmacy as well as PRN zofran. Discussed return precautions including signs of dehydration. ? ?Assessment and Plan  ?Nausea of early pregnancy ?Improved with phenergan and fluids ?Sent Diclegis to patient's pharmacy as well as PRN zofran. ?Return precautions provided as well as counseling on foods to avoid. ? ?Renard Matter, MD, MPH ?OB Fellow, Faculty Practice ? ? ?

## 2021-08-11 ENCOUNTER — Telehealth (INDEPENDENT_AMBULATORY_CARE_PROVIDER_SITE_OTHER): Payer: Self-pay

## 2021-08-11 DIAGNOSIS — O0990 Supervision of high risk pregnancy, unspecified, unspecified trimester: Secondary | ICD-10-CM

## 2021-08-11 DIAGNOSIS — Z3A00 Weeks of gestation of pregnancy not specified: Secondary | ICD-10-CM

## 2021-08-11 DIAGNOSIS — O352XX0 Maternal care for (suspected) hereditary disease in fetus, not applicable or unspecified: Secondary | ICD-10-CM

## 2021-08-11 DIAGNOSIS — O10019 Pre-existing essential hypertension complicating pregnancy, unspecified trimester: Secondary | ICD-10-CM

## 2021-08-11 NOTE — Progress Notes (Signed)
New OB Intake ? ?I connected with  Becky Torres on 08/11/21 at  9:15 AM EDT by MyChart Video Visit and verified that I am speaking with the correct person using two identifiers. Nurse is located at [facility] and pt is located at home. ? ?I discussed the limitations, risks, security and privacy concerns of performing an evaluation and management service by telephone and the availability of in person appointments. I also discussed with the patient that there may be a patient responsible charge related to this service. The patient expressed understanding and agreed to proceed. ? ?I explained I am completing New OB Intake today. We discussed her EDD of 03/22/2022 that is based on ultrasound 07/30/2021. Pt is G5/P3013. I reviewed her allergies, medications, Medical/Surgical/OB history, and appropriate screenings. I informed her of [facility] services. Based on history, this is a/an  pregnancy complicated by hypertension  ? ?Patient Active Problem List  ? Diagnosis Date Noted  ? Supervision of high risk pregnancy, antepartum 08/11/2021  ? Hypertension 07/24/2012  ? Short cervix affecting pregnancy 07/01/2012  ? ? ?Concerns addressed today ?-Patient stated she has hypertension but is not on medication for it. ?-Patient's first son has the sickle cell disease, and patient has the sickle cell traits.  ? ?Delivery Plans:  ?Plans to deliver at [facility].  ? ?MyChart/Babyscripts ?MyChart access verified. I explained pt will have some visits in office and some virtually. Babyscripts instructions given and order placed. Patient verifies receipt of registration text/e-mail. Account successfully created and app downloaded. ? ?Blood Pressure Cuff  ?Blood pressure cuff ordered for patient to pick-up from [employer]. Explained after first prenatal appt pt will check weekly and document in Babyscripts. ? ?Weight scale: Patient does not  have weight scale. Weight scale ordered for patient to pick up from [employer].  ? ?Anatomy  US ?Explained first scheduled [place] will be around 19 weeks. Anatomy US scheduled for 10/26/2021 at 10:30AM. Pt notified to arrive at 10:15AM. ? ?Labs ?Discussed Avelina Laine genetic screening with patient. Would like both Panorama and Horizon drawn at new OB visit.Also if interested in genetic testing, tell patient she will need AFP 15-21 weeks to complete genetic testing .Routine prenatal labs needed. ? ?Covid Vaccine ?Patient has covid vaccine.  ? ?Is patient a CenteringPregnancy candidate? Declined   ?Is patient a Mom+Baby Combined Care candidate? Not a candidate   ?  ? ?Informed patient of [facility] website  and placed link in her AVS.  ? ?Social Determinants of Health ?Food Insecurity: Patient denies food insecurity. ?WIC Referral: Patient is interested in referral to [facility].  ?Transportation: Patient denies transportation needs. ?Childcare: Discussed no children allowed at ultrasound appointments. Offered childcare services; patient declines childcare services at this time. ? ? ? ?Placed OB Box on problem list and updated ? ?First visit review ?I reviewed new OB appt with pt. I explained she will have a pelvic exam, ob bloodwork with genetic screening, and PAP smear. Explained pt will be seen by [name] MD at first visit; encounter routed to appropriate provider. Explained that patient will be seen by pregnancy navigator following visit with provider. [facility] information placed in AVS.  ? ?Vidal Schwalbe, CMA ?08/11/2021  9:45 AM  ?

## 2021-08-18 ENCOUNTER — Inpatient Hospital Stay (HOSPITAL_COMMUNITY)
Admission: AD | Admit: 2021-08-18 | Discharge: 2021-08-19 | Disposition: A | Discharge status: Home / Self Care | Payer: Medicaid Other | Attending: Obstetrics and Gynecology | Admitting: Obstetrics and Gynecology

## 2021-08-18 ENCOUNTER — Encounter (HOSPITAL_COMMUNITY): Payer: Self-pay | Admitting: Obstetrics and Gynecology

## 2021-08-18 DIAGNOSIS — O211 Hyperemesis gravidarum with metabolic disturbance: Secondary | ICD-10-CM | POA: Insufficient documentation

## 2021-08-18 DIAGNOSIS — Z3A09 9 weeks gestation of pregnancy: Secondary | ICD-10-CM | POA: Insufficient documentation

## 2021-08-18 DIAGNOSIS — E860 Dehydration: Secondary | ICD-10-CM | POA: Diagnosis not present

## 2021-08-18 DIAGNOSIS — O219 Vomiting of pregnancy, unspecified: Secondary | ICD-10-CM | POA: Diagnosis not present

## 2021-08-18 DIAGNOSIS — O210 Mild hyperemesis gravidarum: Secondary | ICD-10-CM | POA: Diagnosis present

## 2021-08-18 LAB — URINALYSIS, ROUTINE W REFLEX MICROSCOPIC
Bacteria, UA: NONE SEEN
Bilirubin Urine: NEGATIVE
Glucose, UA: NEGATIVE mg/dL
Hgb urine dipstick: NEGATIVE
Ketones, ur: 80 mg/dL — AB
Nitrite: NEGATIVE
Protein, ur: NEGATIVE mg/dL
Specific Gravity, Urine: 1.021 (ref 1.005–1.030)
pH: 5 (ref 5.0–8.0)

## 2021-08-18 MED ORDER — LACTATED RINGERS IV SOLN: Freq: Once | INTRAVENOUS | Status: AC

## 2021-08-18 MED ORDER — METOCLOPRAMIDE HCL 5 MG/ML IJ SOLN
10.0000 mg | Freq: Once | INTRAMUSCULAR | Status: AC
Start: 2021-08-18 — End: 2021-08-18
  Administered 2021-08-18: 10 mg via INTRAVENOUS
  Filled 2021-08-18: qty 2

## 2021-08-18 MED ORDER — SCOPOLAMINE 1 MG/3DAYS TD PT72
1.0000 | MEDICATED_PATCH | Freq: Once | TRANSDERMAL | Status: DC
  Administered 2021-08-18: 1.5 mg via TRANSDERMAL
  Filled 2021-08-18: qty 1

## 2021-08-18 MED ORDER — ONDANSETRON 4 MG PO TBDP
4.0000 mg | ORAL_TABLET | Freq: Once | ORAL | Status: AC
  Administered 2021-08-18: 4 mg via ORAL
  Filled 2021-08-18: qty 1

## 2021-08-18 MED ORDER — DIPHENHYDRAMINE HCL 50 MG/ML IJ SOLN
25.0000 mg | Freq: Once | INTRAMUSCULAR | Status: AC
  Administered 2021-08-18: 25 mg via INTRAVENOUS
  Filled 2021-08-18: qty 1

## 2021-08-18 NOTE — MAU Provider Note (Signed)
Chief Complaint: Emesis ? ? Event Date/Time  ? First Provider Initiated Contact with Patient 08/18/21 2035   ?  ?   ?SUBJECTIVE ?HPI: Becky Torres is a 31 y.o. [id number] at [redacted]w[redacted]d by LMP who presents to maternity admissions reporting nausea and vomiting that has persisted despite medications.  Was seen last week for this and given Diclegis and Zofran which helped for a while.   Was only given 5 Zofran tablets and ran out. Marland Kitchen ?She denies vaginal bleeding, vaginal itching/burning, urinary symptoms, h/a, dizziness or fever/chills.   ? ?Emesis  ?This is a recurrent problem. The problem has been unchanged. There has been no fever. Pertinent negatives include no abdominal pain, chills, diarrhea, dizziness, fever or headaches. Treatments tried: Diclegis and Zofran. The treatment provided mild relief.  ? ?RN Note: ?Becky Torres is a 31 y.o. at [redacted]w[redacted]d here in MAU reporting: has had n/v for several weeks. Had been using Unisom and b6. Had zofran but only 5 pills and has no more . Can't keep the b6 ans Unisom down now.  ?LMP:  ?Onset of complaint: 3 days ?Pain score: 0 ? ?Past Medical History:  ?Diagnosis Date  ? Anemia   ? Chlamydia   ? Hypertension   ? chronic, diagnosed at age 18, takes Labetalol  ? Sickle cell trait (HCC)   ? ?History reviewed. No pertinent surgical history. ?Social History  ? ?Socioeconomic History  ? Marital status: Single  ?  Spouse name: Not on file  ? Number of children: Not on file  ? Years of education: Not on file  ? Highest education level: Not on file  ?Occupational History  ? Not on file  ?Tobacco Use  ? Smoking status: Never  ? Smokeless tobacco: Never  ?Vaping Use  ? Vaping Use: Never used  ?Substance and Sexual Activity  ? Alcohol use: No  ? Drug use: No  ? Sexual activity: Yes  ?  Birth control/protection: None  ?Other Topics Concern  ? Not on file  ?Social History Narrative  ? Not on file  ? ?Social Determinants of Health  ? ?Financial Resource Strain: Not on file  ?Food Insecurity: Not on  file  ?Transportation Needs: Not on file  ?Physical Activity: Not on file  ?Stress: Not on file  ?Social Connections: Not on file  ?Intimate Partner Violence: Not on file  ? ?No current facility-administered medications on file prior to encounter.  ? ?Current Outpatient Medications on File Prior to Encounter  ?Medication Sig Dispense Refill  ? acetaminophen (TYLENOL) 325 MG tablet Take 2 tablets (650 mg total) by mouth every 4 (four) hours as needed (for pain scale < 4).    ? Doxylamine-Pyridoxine (DICLEGIS) 10-10 MG TBEC Take 1 tablet by mouth daily. Two tablets at bedtime on days 1 and 2; if symptoms persist, take 1 tablet in morning and 2 tablets at bedtime on day 3; if symptoms persist, may further increase to 1 tablet in morning, 1 tablet mid-afternoon, and 2 tablets at bedtime on day 4 (maximum: doxylamine 40 mg/pyridoxine 40 mg [4 tablets] per day). (Patient not taking: Reported on 08/11/2021) 60 tablet 0  ? ondansetron (ZOFRAN) 4 MG tablet Take 1 tablet (4 mg total) by mouth daily as needed for nausea, vomiting or refractory nausea / vomiting. 5 tablet 0  ? Prenat-Fe Carbonyl-FA-Omega 3 (ONE-A-DAY WOMENS PRENATAL 1) 28-0.8-235 MG CAPS Take 1 capsule by mouth daily.    ? ?No Known Allergies ? ?I have reviewed patient's Past Medical Hx,  Surgical Hx, Family Hx, Social Hx, medications and allergies.  ? ?ROS:  ?Review of Systems  ?Constitutional:  Negative for chills and fever.  ?Gastrointestinal:  Positive for vomiting. Negative for abdominal pain and diarrhea.  ?Neurological:  Negative for dizziness and headaches.  ?Review of Systems  ?Other systems negative ? ? ?Physical Exam  ?Physical Exam ?Patient Vitals for the past 24 hrs: ? BP Temp Pulse Resp Height Weight  ?08/18/21 2005 (!) 129/93 98.9 ?F (37.2 ?C) (!) 102 18 5\' 4"  (1.626 m) 92.1 kg  ? ?Vitals:  ? 08/18/21 2005 08/19/21 0024  ?BP: (!) 129/93 (!) 147/94  ?Pulse: (!) 102 97  ?Resp: 18   ?Temp: 98.9 ?F (37.2 ?C)   ?Weight: 92.1 kg   ?Height: 5\' 4"  (1.626  m)   ? ? ?Constitutional: Well-developed, well-nourished female in no acute distress.  ?Cardiovascular: normal rate ?Respiratory: normal effort ?GI: Abd soft, non-tender. Pos BS x 4 ?MS: Extremities nontender, no edema, normal ROM ?Neurologic: Alert and oriented x 4.  ?GU: Neg CVAT. ? ?PELVIC EXAM: Cervix pink, visually closed, without lesion, scant white creamy discharge, vaginal walls and external genitalia normal ?Bimanual exam: Cervix 0/long/high, firm, anterior, neg CMT, uterus nontender, nonenlarged, adnexa without tenderness, enlargement, or mass ? ?LAB RESULTS ?Results for orders placed or performed during the hospital encounter of 08/18/21 (from the past 24 hour(s))  ?Urinalysis, Routine w reflex microscopic Urine, Clean Catch     Status: Abnormal  ? Collection Time: 08/18/21  8:29 PM  ?Result Value Ref Range  ? Color, Urine AMBER (A) YELLOW  ? APPearance CLEAR CLEAR  ? Specific Gravity, Urine 1.021 1.005 - 1.030  ? pH 5.0 5.0 - 8.0  ? Glucose, UA NEGATIVE NEGATIVE mg/dL  ? Hgb urine dipstick NEGATIVE NEGATIVE  ? Bilirubin Urine NEGATIVE NEGATIVE  ? Ketones, ur 80 (A) NEGATIVE mg/dL  ? Protein, ur NEGATIVE NEGATIVE mg/dL  ? Nitrite NEGATIVE NEGATIVE  ? Leukocytes,Ua TRACE (A) NEGATIVE  ? RBC / HPF 0-5 0 - 5 RBC/hpf  ? WBC, UA 0-5 0 - 5 WBC/hpf  ? Bacteria, UA NONE SEEN NONE SEEN  ? Squamous Epithelial / LPF 6-10 0 - 5  ? Mucus PRESENT   ?  ? ?IMAGING ? ?MAU Management/MDM: ?Ordered IV fluids, Reglan, Benadryl, and Scopolamine patch.  She got some relief from this but was still nauseated   Zofran given with good relief ?Two liters of fluid given.  ?Discussed small frequent meals and meds ?Will give note for off work this week to catch up.  ? ?ASSESSMENT ?Single IUP at [redacted]w[redacted]d ?Nausea and vomiting ?Dehydration ? ?PLAN ?Discharge home ?Rx Scopolamine patch ?New Rx for Zofran sent ?Advance diet as tolerated ? ?Pt stable at time of discharge. ?Encouraged to return here if she develops worsening of symptoms,  increase in pain, fever, or other concerning symptoms.  ? ? ?08/20/21 CNM, MSN ?Certified Nurse-Midwife ?08/18/2021  ?8:35 PM ? ? ? ? ?

## 2021-08-18 NOTE — MAU Note (Signed)
.  Becky Torres is a 31 y.o. at [redacted]w[redacted]d here in MAU reporting: has had n/v for several weeks. Had been using Unisom and b6. Had zofran but only 5 pills and has no more . Can't keep the b6 ans Unisom down now.  ?LMP:  ?Onset of complaint: 3 days ?Pain score: 0 ?Vitals:  ? 08/18/21 2005  ?BP: (!) 129/93  ?Pulse: (!) 102  ?Resp: 18  ?Temp: 98.9 ?F (37.2 ?C)  ?   ?FHT:n/a ?Lab orders placed from triage:  u/a ? ? ?

## 2021-08-19 DIAGNOSIS — Z3A09 9 weeks gestation of pregnancy: Secondary | ICD-10-CM | POA: Diagnosis not present

## 2021-08-19 DIAGNOSIS — O219 Vomiting of pregnancy, unspecified: Secondary | ICD-10-CM | POA: Diagnosis not present

## 2021-08-19 DIAGNOSIS — E860 Dehydration: Secondary | ICD-10-CM

## 2021-08-19 MED ORDER — ONDANSETRON HCL 4 MG PO TABS: 4.0000 mg | ORAL_TABLET | Freq: Three times a day (TID) | ORAL | 1 refills | Status: DC | PRN

## 2021-08-19 MED ORDER — SCOPOLAMINE 1 MG/3DAYS TD PT72: 1.0000 | MEDICATED_PATCH | TRANSDERMAL | 12 refills | Status: DC

## 2021-08-20 ENCOUNTER — Telehealth: Payer: Self-pay | Admitting: Lactation Services

## 2021-08-20 NOTE — Telephone Encounter (Signed)
PA for Diclegis submitted via Cover My Meds. Awaiting on response.  ?

## 2021-08-24 ENCOUNTER — Encounter: Payer: Self-pay | Admitting: Obstetrics and Gynecology

## 2021-08-25 ENCOUNTER — Telehealth: Payer: Self-pay | Admitting: Lactation Services

## 2021-08-25 NOTE — Telephone Encounter (Signed)
Received PA approval for Diclegis.  ? ?Called Pharmacy to let them know Diclegis has been approved. Per Pharmacy, patient has already picked up.  ?

## 2021-08-26 ENCOUNTER — Ambulatory Visit (INDEPENDENT_AMBULATORY_CARE_PROVIDER_SITE_OTHER): Payer: Medicaid Other | Admitting: Obstetrics and Gynecology

## 2021-08-26 ENCOUNTER — Other Ambulatory Visit (HOSPITAL_COMMUNITY)
Admission: RE | Admit: 2021-08-26 | Discharge: 2021-08-26 | Disposition: A | Discharge status: Home / Self Care | Payer: Medicaid Other | Source: Ambulatory Visit | Attending: Obstetrics and Gynecology | Admitting: Obstetrics and Gynecology

## 2021-08-26 ENCOUNTER — Encounter: Payer: Self-pay | Admitting: Obstetrics and Gynecology

## 2021-08-26 VITALS — BP 129/99 | HR 146 | Wt 197.8 lb

## 2021-08-26 DIAGNOSIS — O99619 Diseases of the digestive system complicating pregnancy, unspecified trimester: Secondary | ICD-10-CM

## 2021-08-26 DIAGNOSIS — O0990 Supervision of high risk pregnancy, unspecified, unspecified trimester: Secondary | ICD-10-CM | POA: Insufficient documentation

## 2021-08-26 DIAGNOSIS — Z1331 Encounter for screening for depression: Secondary | ICD-10-CM

## 2021-08-26 DIAGNOSIS — Z0289 Encounter for other administrative examinations: Secondary | ICD-10-CM

## 2021-08-26 DIAGNOSIS — K117 Disturbances of salivary secretion: Secondary | ICD-10-CM | POA: Insufficient documentation

## 2021-08-26 DIAGNOSIS — D573 Sickle-cell trait: Secondary | ICD-10-CM

## 2021-08-26 DIAGNOSIS — Z3A10 10 weeks gestation of pregnancy: Secondary | ICD-10-CM

## 2021-08-26 DIAGNOSIS — O219 Vomiting of pregnancy, unspecified: Secondary | ICD-10-CM | POA: Insufficient documentation

## 2021-08-26 MED ORDER — PROMETHAZINE HCL 25 MG RE SUPP: 25.0000 mg | Freq: Four times a day (QID) | RECTAL | 0 refills | Status: DC | PRN

## 2021-08-26 MED ORDER — GLYCOPYRROLATE 2 MG PO TABS: 1.0000 mg | ORAL_TABLET | Freq: Three times a day (TID) | ORAL | 3 refills | Status: DC | PRN

## 2021-08-26 NOTE — Patient Instructions (Signed)
[facility] [address] [place], [place] [address] [phone] (main office) [phone] (Millicent Blazejewski's office)   

## 2021-08-26 NOTE — Progress Notes (Addendum)
? ?INITIAL PRENATAL VISIT NOTE ? ?Subjective:  ?Becky Torres is a 31 y.o. [id number] at [redacted]w[redacted]d by early ultrasound being seen today for her initial prenatal visit.  She has an obstetric history significant for increased nausea and vomiting. She has a medical history significant for anemia and possible chronic hypertension. ? ?Patient reports nausea.  Contractions: Not present. Vag. Bleeding: None.   . Denies leaking of fluid. She has had several MAU visits over nausea.  She is intermittently keeping down fluids, but has not tried rectal phenergan. ? ? ?Past Medical History:  ?Diagnosis Date  ? Anemia   ? Chlamydia   ? Hypertension   ? chronic, diagnosed at age 32, takes Labetalol  ? Sickle cell trait (HCC)   ? ? ?No past surgical history on file. ? ?OB History  ?Gravida Para Term Preterm AB Living  ?5 3 3  0 1 3  ?SAB IAB Ectopic Multiple Live Births  ?1 0 0 0 3  ?  ?# Outcome Date GA Lbr Len/2nd Weight Sex Delivery Anes PTL Lv  ?5 Current           ?4 Term 12/31/16 101w2d 01:33 / 00:20 2.719 kg F Vag-Spont EPI  LIV  ?3 SAB 12/10/15 [redacted]w[redacted]d         ?2 Term 09/19/12 [redacted]w[redacted]d 14:08 / 00:52 2.59 kg M Vag-Spont EPI  LIV  ?1 Term 07/26/10 [redacted]w[redacted]d   M Vag-Spont EPI N LIV  ? ? ?Social History  ? ?Socioeconomic History  ? Marital status: Single  ?  Spouse name: Not on file  ? Number of children: Not on file  ? Years of education: Not on file  ? Highest education level: Not on file  ?Occupational History  ? Not on file  ?Tobacco Use  ? Smoking status: Never  ? Smokeless tobacco: Never  ?Vaping Use  ? Vaping Use: Never used  ?Substance and Sexual Activity  ? Alcohol use: No  ? Drug use: No  ? Sexual activity: Yes  ?  Birth control/protection: None  ?Other Topics Concern  ? Not on file  ?Social History Narrative  ? Not on file  ? ?Social Determinants of Health  ? ?Financial Resource Strain: Not on file  ?Food Insecurity: Not on file  ?Transportation Needs: Not on file  ?Physical Activity: Not on file  ?Stress: Not on file  ?Social  Connections: Not on file  ? ? ?Family History  ?Problem Relation Age of Onset  ? Hypertension Mother   ? Hypertension Brother   ? Sickle cell anemia Son   ? Hypertension Maternal Aunt   ? Hypertension Maternal Grandmother   ? ? ? ?Current Outpatient Medications:  ?  acetaminophen (TYLENOL) 325 MG tablet, Take 2 tablets (650 mg total) by mouth every 4 (four) hours as needed (for pain scale < 4)., Disp: , Rfl:  ?  glycopyrrolate (ROBINUL) 2 MG tablet, Take 0.5 tablets (1 mg total) by mouth 3 (three) times daily as needed., Disp: 30 tablet, Rfl: 3 ?  ondansetron (ZOFRAN) 4 MG tablet, Take 1 tablet (4 mg total) by mouth every 8 (eight) hours as needed for nausea, vomiting or refractory nausea / vomiting., Disp: 20 tablet, Rfl: 1 ?  Prenat-Fe Carbonyl-FA-Omega 3 (ONE-A-DAY WOMENS PRENATAL 1) 28-0.8-235 MG CAPS, Take 1 capsule by mouth daily., Disp: , Rfl:  ?  promethazine (PHENERGAN) 25 MG suppository, Place 1 suppository (25 mg total) rectally every 6 (six) hours as needed for nausea or vomiting., Disp: 12 each, Rfl:  0 ?  Doxylamine-Pyridoxine (DICLEGIS) 10-10 MG TBEC, Take 1 tablet by mouth daily. Two tablets at bedtime on days 1 and 2; if symptoms persist, take 1 tablet in morning and 2 tablets at bedtime on day 3; if symptoms persist, may further increase to 1 tablet in morning, 1 tablet mid-afternoon, and 2 tablets at bedtime on day 4 (maximum: doxylamine 40 mg/pyridoxine 40 mg [4 tablets] per day). (Patient not taking: Reported on 08/11/2021), Disp: 60 tablet, Rfl: 0 ?  scopolamine (TRANSDERM-SCOP) 1 MG/3DAYS, Place 1 patch (1.5 mg total) onto the skin every 3 (three) days. (Patient not taking: Reported on 08/26/2021), Disp: 10 patch, Rfl: 12 ? ?No Known Allergies ? ?Review of Systems: Negative except for what is mentioned in HPI. ? ?Objective:  ? ?Vitals:  ? 08/26/21 1341 08/26/21 1347  ?BP: (!) 134/105 (!) 129/99  ?Pulse: (!) 146   ?Weight: 89.7 kg   ? ? ?Fetal Status: Fetal Heart Rate (bpm): 125        ? ?Physical  Exam: ?BP (!) 129/99   Pulse (!) 146   Wt 89.7 kg   LMP 06/08/2021   BMI 33.95 kg/m?  ?CONSTITUTIONAL: Well-developed, well-nourished female in no acute distress.  ?NEUROLOGIC: Alert and oriented to person, place, and time. Normal reflexes, muscle tone coordination. No cranial nerve deficit noted. ?PSYCHIATRIC: Normal mood and affect. Normal behavior. Normal judgment and thought content. ?SKIN: Skin is warm and dry. No rash noted. Not diaphoretic. No erythema. No pallor. ?HENT:  Normocephalic, atraumatic, External right and left ear normal. Oropharynx is clear and moist ?EYES: Conjunctivae and EOM are normal. Pupils are equal, round, and reactive to light. No scleral icterus.  ?NECK: Normal range of motion, supple, no masses ?CARDIOVASCULAR: Normal heart rate noted, regular rhythm ?RESPIRATORY: Effort and breath sounds normal, no problems with respiration noted ?BREASTS: deferred ?ABDOMEN: Soft, nontender, nondistended, gravid. ?GU: normal appearing external female genitalia, multiparous normal appearing cervix, scant white discharge in vagina, no lesions noted, pap taken, chaperone present ?Bimanual: 10 weeks sized uterus, no adnexal tenderness or palpable lesions noted ?MUSCULOSKELETAL: Normal range of motion. ?EXT:  No edema and no tenderness. 2+ distal pulses. ? ? ?Assessment and Plan:  ?Pregnancy: [id number] at [redacted]w[redacted]d by ultrasound ? ?1. Positive depression screening ? ?- Ambulatory referral to Integrated Behavior[facility] ? ?2. [redacted] weeks gestation of pregnancy ? ? ?3. Supervision of high risk pregnancy, antepartum ?Continue routine care ?Pt desires BTL ? ?- Cervicovaginal ancillary only( [place]) ?- Genetic Screening ?- CBC/D/Plt+RPR+Rh+ABO+RubIgG... ?- Protein / creatinine ratio, urine ?- TSH ?- Hemoglobin A1c ?- Cytology - PAP( [place]) ? ?4. Sickle cell trait (HCC) ? ? ?5. Nausea and vomiting during pregnancy ?Pt still notes some moderate nausea and vomiting.  Discussed continuing zofran but added  rectal suppositories with phenergan.  If nausea worsens , she may need to be admitted for IV hydration and steroid therapy per the [facility] doctrine.  Pt did have scopolamine previously ordered but has not picked it up yet. ? ?- promethazine (PHENERGAN) 25 MG suppository; Place 1 suppository (25 mg total) rectally every 6 (six) hours as needed for nausea or vomiting.  Dispense: 12 each; Refill: 0 ? ?6. Excessive salivation while pregnant ? ?- glycopyrrolate (ROBINUL) 2 MG tablet; Take 0.5 tablets (1 mg total) by mouth 3 (three) times daily as needed.  Dispense: 30 tablet; Refill: 3 ? ? ?Preterm labor symptoms and general obstetric precautions including but not limited to vaginal bleeding, contractions, leaking of fluid and fetal movement were  reviewed in detail with the patient. ? ?Please refer to After Visit Summary for other counseling recommendations.  ? ?[facility]2021[facility] ? ?Warden FillersLawrence A Brenlee Koskela ?08/26/2021 ?6:08 PM  ?

## 2021-08-26 NOTE — Progress Notes (Signed)
PT states that she cant keep anything down & has excessive saliva. ? ?PHQ-9: 25 ?GAD-7: 21 ?Question 9, she chose a 1 ?

## 2021-08-27 ENCOUNTER — Encounter (HOSPITAL_COMMUNITY): Payer: Self-pay | Admitting: Obstetrics and Gynecology

## 2021-08-27 ENCOUNTER — Other Ambulatory Visit: Payer: Self-pay

## 2021-08-27 ENCOUNTER — Inpatient Hospital Stay (HOSPITAL_COMMUNITY)
Admission: AD | Admit: 2021-08-27 | Discharge: 2021-09-03 | DRG: 831 | Disposition: A | Discharge status: Home / Self Care | Payer: Medicaid Other | Attending: Obstetrics & Gynecology | Admitting: Obstetrics & Gynecology

## 2021-08-27 DIAGNOSIS — Z3A11 11 weeks gestation of pregnancy: Secondary | ICD-10-CM

## 2021-08-27 DIAGNOSIS — Z3A10 10 weeks gestation of pregnancy: Secondary | ICD-10-CM

## 2021-08-27 DIAGNOSIS — R7989 Other specified abnormal findings of blood chemistry: Secondary | ICD-10-CM

## 2021-08-27 DIAGNOSIS — N390 Urinary tract infection, site not specified: Secondary | ICD-10-CM | POA: Diagnosis present

## 2021-08-27 DIAGNOSIS — O219 Vomiting of pregnancy, unspecified: Secondary | ICD-10-CM

## 2021-08-27 DIAGNOSIS — K831 Obstruction of bile duct: Secondary | ICD-10-CM | POA: Diagnosis present

## 2021-08-27 DIAGNOSIS — D571 Sickle-cell disease without crisis: Secondary | ICD-10-CM | POA: Diagnosis not present

## 2021-08-27 DIAGNOSIS — R748 Abnormal levels of other serum enzymes: Secondary | ICD-10-CM | POA: Diagnosis present

## 2021-08-27 DIAGNOSIS — O0990 Supervision of high risk pregnancy, unspecified, unspecified trimester: Secondary | ICD-10-CM

## 2021-08-27 DIAGNOSIS — O2341 Unspecified infection of urinary tract in pregnancy, first trimester: Secondary | ICD-10-CM | POA: Diagnosis present

## 2021-08-27 DIAGNOSIS — E876 Hypokalemia: Secondary | ICD-10-CM

## 2021-08-27 DIAGNOSIS — K828 Other specified diseases of gallbladder: Secondary | ICD-10-CM | POA: Diagnosis present

## 2021-08-27 DIAGNOSIS — O26891 Other specified pregnancy related conditions, first trimester: Secondary | ICD-10-CM | POA: Diagnosis present

## 2021-08-27 DIAGNOSIS — R7401 Elevation of levels of liver transaminase levels: Secondary | ICD-10-CM | POA: Diagnosis present

## 2021-08-27 DIAGNOSIS — R1110 Vomiting, unspecified: Secondary | ICD-10-CM | POA: Diagnosis present

## 2021-08-27 DIAGNOSIS — K117 Disturbances of salivary secretion: Secondary | ICD-10-CM

## 2021-08-27 DIAGNOSIS — N179 Acute kidney failure, unspecified: Secondary | ICD-10-CM | POA: Diagnosis present

## 2021-08-27 DIAGNOSIS — D573 Sickle-cell trait: Secondary | ICD-10-CM | POA: Diagnosis present

## 2021-08-27 DIAGNOSIS — O10011 Pre-existing essential hypertension complicating pregnancy, first trimester: Secondary | ICD-10-CM | POA: Diagnosis present

## 2021-08-27 DIAGNOSIS — K8020 Calculus of gallbladder without cholecystitis without obstruction: Secondary | ICD-10-CM | POA: Diagnosis present

## 2021-08-27 DIAGNOSIS — O26831 Pregnancy related renal disease, first trimester: Secondary | ICD-10-CM | POA: Diagnosis present

## 2021-08-27 DIAGNOSIS — O211 Hyperemesis gravidarum with metabolic disturbance: Principal | ICD-10-CM

## 2021-08-27 DIAGNOSIS — O99011 Anemia complicating pregnancy, first trimester: Secondary | ICD-10-CM | POA: Diagnosis present

## 2021-08-27 DIAGNOSIS — O26611 Liver and biliary tract disorders in pregnancy, first trimester: Secondary | ICD-10-CM | POA: Diagnosis present

## 2021-08-27 DIAGNOSIS — O2340 Unspecified infection of urinary tract in pregnancy, unspecified trimester: Secondary | ICD-10-CM | POA: Diagnosis present

## 2021-08-27 LAB — CERVICOVAGINAL ANCILLARY ONLY
Bacterial Vaginitis (gardnerella): POSITIVE — AB
Candida Glabrata: NEGATIVE
Candida Vaginitis: NEGATIVE
Chlamydia: NEGATIVE
Comment: NEGATIVE
Comment: NEGATIVE
Comment: NEGATIVE
Comment: NEGATIVE
Comment: NEGATIVE
Comment: NORMAL
Neisseria Gonorrhea: NEGATIVE
Trichomonas: NEGATIVE

## 2021-08-27 MED ORDER — METOCLOPRAMIDE HCL 5 MG/ML IJ SOLN
10.0000 mg | Freq: Once | INTRAMUSCULAR | Status: AC
  Administered 2021-08-28: 10 mg via INTRAVENOUS
  Filled 2021-08-27: qty 2

## 2021-08-27 MED ORDER — LACTATED RINGERS IV BOLUS
1000.0000 mL | Freq: Once | INTRAVENOUS | Status: AC
  Administered 2021-08-28: 1000 mL via INTRAVENOUS

## 2021-08-27 MED ORDER — FAMOTIDINE IN NACL 20-0.9 MG/50ML-% IV SOLN
20.0000 mg | Freq: Once | INTRAVENOUS | Status: AC
  Administered 2021-08-28: 20 mg via INTRAVENOUS
  Filled 2021-08-27: qty 50

## 2021-08-27 NOTE — MAU Note (Addendum)
Pt says she was here 2 weeks ago - for vomiting  ?Gave RX for meds -  ?[facility]- clinic ?Was Rx- scope patch- on now, Took  Phenergan supp at 0700 ?Pill for spitting ?Vomited zofran - 0700 ?Back hurts some ?

## 2021-08-27 NOTE — MAU Provider Note (Incomplete)
?History  ?  ? ?[id number] ? ?Arrival date and time: 08/27/21 2213 ?  ? ?Chief Complaint  ?Patient presents with  ? Emesis  ? ? ? ?HPI ?Becky Torres is a 31 y.o. at [redacted]w[redacted]d by *** with PMHx notable for ***, who presents for ***.  ? ?{GYN/OB [id number]} ? ?Past Medical History:  ?Diagnosis Date  ? Anemia   ? Chlamydia   ? Hypertension   ? chronic, diagnosed at age 33, takes Labetalol  ? Sickle cell trait (HCC)   ? ? ?History reviewed. No pertinent surgical history. ? ?Family History  ?Problem Relation Age of Onset  ? Hypertension Mother   ? Hypertension Brother   ? Sickle cell anemia Son   ? Hypertension Maternal Aunt   ? Hypertension Maternal Grandmother   ? ? ?No Known Allergies ? ?No current facility-administered medications on file prior to encounter.  ? ?Current Outpatient Medications on File Prior to Encounter  ?Medication Sig Dispense Refill  ? acetaminophen (TYLENOL) 325 MG tablet Take 2 tablets (650 mg total) by mouth every 4 (four) hours as needed (for pain scale < 4).    ? glycopyrrolate (ROBINUL) 2 MG tablet Take 0.5 tablets (1 mg total) by mouth 3 (three) times daily as needed. 30 tablet 3  ? ondansetron (ZOFRAN) 4 MG tablet Take 1 tablet (4 mg total) by mouth every 8 (eight) hours as needed for nausea, vomiting or refractory nausea / vomiting. 20 tablet 1  ? Prenat-Fe Carbonyl-FA-Omega 3 (ONE-A-DAY WOMENS PRENATAL 1) 28-0.8-235 MG CAPS Take 1 capsule by mouth daily.    ? promethazine (PHENERGAN) 25 MG suppository Place 1 suppository (25 mg total) rectally every 6 (six) hours as needed for nausea or vomiting. 12 each 0  ? scopolamine (TRANSDERM-SCOP) 1 MG/3DAYS Place 1 patch (1.5 mg total) onto the skin every 3 (three) days. 10 patch 12  ? Doxylamine-Pyridoxine (DICLEGIS) 10-10 MG TBEC Take 1 tablet by mouth daily. Two tablets at bedtime on days 1 and 2; if symptoms persist, take 1 tablet in morning and 2 tablets at bedtime on day 3; if symptoms persist, may further increase to 1 tablet in morning, 1  tablet mid-afternoon, and 2 tablets at bedtime on day 4 (maximum: doxylamine 40 mg/pyridoxine 40 mg [4 tablets] per day). (Patient not taking: Reported on 08/11/2021) 60 tablet 0  ? ? ? ?ROS ?Pertinent positives and negative per HPI, all others reviewed and negative ? ?Physical Exam  ? ?BP (!) 129/105 (BP Location: Right Arm)   Pulse (!) 123   Temp 99.3 ?F (37.4 ?C) (Oral)   Resp 18   Ht 5\' 4"  (1.626 m)   Wt 89.2 kg   LMP 06/08/2021   BMI 33.75 kg/m?  ? ?Patient Vitals for the past 24 hrs: ? BP Temp Temp src Pulse Resp Height Weight  ?08/27/21 2231 (!) 129/105 99.3 ?F (37.4 ?C) Oral (!) 123 18 5\' 4"  (1.626 m) 89.2 kg  ? ? ?Physical Exam  ? ?Cervical Exam ?  ? ?Bedside Ultrasound ?*** ? ?My interpretation: *** ? ?FHT ?Baseline ***, *** variability, *** accels, *** decels ?Toco: *** ?Cat: *** ? ?Labs ?No results found for this or any previous visit (from the past 24 hour(s)). ? ?Imaging ?No results found. ? ?MAU Course  ?Procedures ? ?Lab Orders    ?     Urinalysis, Routine w reflex microscopic Urine, Clean Catch    ?No orders of the defined types were placed in this encounter. ? ?Imaging Orders  ?No  imaging studies ordered today  ? ? ?MDM ?*** ?Assessment and Plan  ? ?1. Supervision of high risk pregnancy, antepartum   ? ? ? ?Becky Horn, NP ?08/27/21 ?11:02 PM ? ? ?

## 2021-08-28 ENCOUNTER — Encounter (HOSPITAL_COMMUNITY): Payer: Self-pay | Admitting: Obstetrics and Gynecology

## 2021-08-28 ENCOUNTER — Telehealth: Payer: Self-pay | Admitting: *Deleted

## 2021-08-28 DIAGNOSIS — D573 Sickle-cell trait: Secondary | ICD-10-CM | POA: Diagnosis not present

## 2021-08-28 DIAGNOSIS — R1110 Vomiting, unspecified: Secondary | ICD-10-CM | POA: Diagnosis not present

## 2021-08-28 DIAGNOSIS — R7401 Elevation of levels of liver transaminase levels: Secondary | ICD-10-CM | POA: Diagnosis not present

## 2021-08-28 DIAGNOSIS — Z3A10 10 weeks gestation of pregnancy: Secondary | ICD-10-CM

## 2021-08-28 DIAGNOSIS — O210 Mild hyperemesis gravidarum: Secondary | ICD-10-CM | POA: Diagnosis not present

## 2021-08-28 DIAGNOSIS — O99891 Other specified diseases and conditions complicating pregnancy: Secondary | ICD-10-CM | POA: Diagnosis not present

## 2021-08-28 DIAGNOSIS — R7989 Other specified abnormal findings of blood chemistry: Secondary | ICD-10-CM | POA: Diagnosis not present

## 2021-08-28 DIAGNOSIS — O10011 Pre-existing essential hypertension complicating pregnancy, first trimester: Secondary | ICD-10-CM | POA: Diagnosis not present

## 2021-08-28 DIAGNOSIS — O26611 Liver and biliary tract disorders in pregnancy, first trimester: Secondary | ICD-10-CM | POA: Diagnosis not present

## 2021-08-28 DIAGNOSIS — K828 Other specified diseases of gallbladder: Secondary | ICD-10-CM | POA: Diagnosis not present

## 2021-08-28 DIAGNOSIS — K831 Obstruction of bile duct: Secondary | ICD-10-CM | POA: Diagnosis not present

## 2021-08-28 DIAGNOSIS — O211 Hyperemesis gravidarum with metabolic disturbance: Secondary | ICD-10-CM

## 2021-08-28 DIAGNOSIS — E876 Hypokalemia: Secondary | ICD-10-CM | POA: Diagnosis not present

## 2021-08-28 DIAGNOSIS — Z3A11 11 weeks gestation of pregnancy: Secondary | ICD-10-CM | POA: Diagnosis not present

## 2021-08-28 DIAGNOSIS — B9689 Other specified bacterial agents as the cause of diseases classified elsewhere: Secondary | ICD-10-CM

## 2021-08-28 DIAGNOSIS — N179 Acute kidney failure, unspecified: Secondary | ICD-10-CM | POA: Diagnosis not present

## 2021-08-28 DIAGNOSIS — O2341 Unspecified infection of urinary tract in pregnancy, first trimester: Secondary | ICD-10-CM | POA: Diagnosis not present

## 2021-08-28 DIAGNOSIS — N390 Urinary tract infection, site not specified: Secondary | ICD-10-CM | POA: Diagnosis not present

## 2021-08-28 DIAGNOSIS — O26831 Pregnancy related renal disease, first trimester: Secondary | ICD-10-CM | POA: Diagnosis not present

## 2021-08-28 DIAGNOSIS — K117 Disturbances of salivary secretion: Secondary | ICD-10-CM | POA: Diagnosis not present

## 2021-08-28 DIAGNOSIS — Z419 Encounter for procedure for purposes other than remedying health state, unspecified: Secondary | ICD-10-CM | POA: Diagnosis not present

## 2021-08-28 DIAGNOSIS — O99011 Anemia complicating pregnancy, first trimester: Secondary | ICD-10-CM | POA: Diagnosis not present

## 2021-08-28 DIAGNOSIS — K8020 Calculus of gallbladder without cholecystitis without obstruction: Secondary | ICD-10-CM | POA: Diagnosis not present

## 2021-08-28 DIAGNOSIS — O26891 Other specified pregnancy related conditions, first trimester: Secondary | ICD-10-CM | POA: Diagnosis not present

## 2021-08-28 LAB — COMPREHENSIVE METABOLIC PANEL
ALT: 736 U/L — ABNORMAL HIGH (ref 0–44)
AST: 322 U/L — ABNORMAL HIGH (ref 15–41)
Albumin: 3.7 g/dL (ref 3.5–5.0)
Alkaline Phosphatase: 70 U/L (ref 38–126)
Anion gap: 15 (ref 5–15)
BUN: 10 mg/dL (ref 6–20)
CO2: 23 mmol/L (ref 22–32)
Calcium: 9.6 mg/dL (ref 8.9–10.3)
Chloride: 95 mmol/L — ABNORMAL LOW (ref 98–111)
Creatinine, Ser: 1.41 mg/dL — ABNORMAL HIGH (ref 0.44–1.00)
GFR, Estimated: 51 mL/min — ABNORMAL LOW (ref >60)
Glucose, Bld: 123 mg/dL — ABNORMAL HIGH (ref 70–99)
Potassium: 2.8 mmol/L — ABNORMAL LOW (ref 3.5–5.1)
Sodium: 133 mmol/L — ABNORMAL LOW (ref 135–145)
Total Bilirubin: 1.1 mg/dL (ref 0.3–1.2)
Total Protein: 8.7 g/dL — ABNORMAL HIGH (ref 6.5–8.1)

## 2021-08-28 LAB — AMYLASE: Amylase: 165 U/L — ABNORMAL HIGH (ref 28–100)

## 2021-08-28 LAB — URINALYSIS, ROUTINE W REFLEX MICROSCOPIC
Bilirubin Urine: NEGATIVE
Glucose, UA: NEGATIVE mg/dL
Hgb urine dipstick: NEGATIVE
Ketones, ur: 20 mg/dL — AB
Nitrite: NEGATIVE
Protein, ur: 30 mg/dL — AB
Specific Gravity, Urine: 1.018 (ref 1.005–1.030)
pH: 5 (ref 5.0–8.0)

## 2021-08-28 LAB — TYPE AND SCREEN
ABO/RH(D): B POS
Antibody Screen: NEGATIVE

## 2021-08-28 LAB — CBC
HCT: 42.2 % (ref 36.0–46.0)
Hemoglobin: 13.8 g/dL (ref 12.0–15.0)
MCH: 23.6 pg — ABNORMAL LOW (ref 26.0–34.0)
MCHC: 32.7 g/dL (ref 30.0–36.0)
MCV: 72.3 fL — ABNORMAL LOW (ref 80.0–100.0)
Platelets: 314 10*3/uL (ref 150–400)
RBC: 5.84 MIL/uL — ABNORMAL HIGH (ref 3.87–5.11)
RDW: 19.8 % — ABNORMAL HIGH (ref 11.5–15.5)
WBC: 13.6 10*3/uL — ABNORMAL HIGH (ref 4.0–10.5)
nRBC: 0 % (ref 0.0–0.2)

## 2021-08-28 LAB — LIPASE, BLOOD: Lipase: 42 U/L (ref 11–51)

## 2021-08-28 LAB — GLUCOSE, CAPILLARY: Glucose-Capillary: 131 mg/dL — ABNORMAL HIGH (ref 70–99)

## 2021-08-28 LAB — GLUCOSE, RANDOM: Glucose, Bld: 126 mg/dL — ABNORMAL HIGH (ref 70–99)

## 2021-08-28 MED ORDER — ZOLPIDEM TARTRATE 5 MG PO TABS: 5.0000 mg | ORAL_TABLET | Freq: Every evening | ORAL | Status: DC | PRN

## 2021-08-28 MED ORDER — POLYETHYLENE GLYCOL 3350 17 G PO PACK: 17.0000 g | PACK | Freq: Every day | ORAL | Status: DC | PRN

## 2021-08-28 MED ORDER — METRONIDAZOLE 0.75 % VA GEL: 1.0000 | Freq: Every day | VAGINAL | 0 refills | Status: AC

## 2021-08-28 MED ORDER — FAMOTIDINE 20 MG PO TABS
20.0000 mg | ORAL_TABLET | Freq: Two times a day (BID) | ORAL | Status: DC
  Administered 2021-08-28 – 2021-09-03 (×12): 20 mg via ORAL
  Filled 2021-08-28 (×13): qty 1

## 2021-08-28 MED ORDER — METHYLPREDNISOLONE 4 MG PO TABS
8.0000 mg | ORAL_TABLET | Freq: Every day | ORAL | Status: DC
  Administered 2021-09-01 – 2021-09-02 (×2): 8 mg via ORAL
  Filled 2021-08-28 (×4): qty 2

## 2021-08-28 MED ORDER — CALCIUM CARBONATE ANTACID 500 MG PO CHEW: 2.0000 | CHEWABLE_TABLET | ORAL | Status: DC | PRN

## 2021-08-28 MED ORDER — VITAMIN B-6 25 MG PO TABS
25.0000 mg | ORAL_TABLET | Freq: Two times a day (BID) | ORAL | Status: DC
  Administered 2021-08-28 – 2021-09-03 (×13): 25 mg via ORAL
  Filled 2021-08-28 (×13): qty 1

## 2021-08-28 MED ORDER — FAMOTIDINE IN NACL 20-0.9 MG/50ML-% IV SOLN
20.0000 mg | Freq: Two times a day (BID) | INTRAVENOUS | Status: DC
  Administered 2021-08-31: 20 mg via INTRAVENOUS
  Filled 2021-08-28 (×3): qty 50

## 2021-08-28 MED ORDER — DOCUSATE SODIUM 100 MG PO CAPS: 100.0000 mg | ORAL_CAPSULE | Freq: Every day | ORAL | Status: DC

## 2021-08-28 MED ORDER — KCL IN DEXTROSE-NACL 10-5-0.45 MEQ/L-%-% IV SOLN
INTRAVENOUS | Status: DC
  Filled 2021-08-28 (×5): qty 1000

## 2021-08-28 MED ORDER — METHYLPREDNISOLONE 4 MG PO TABS: 4.0000 mg | ORAL_TABLET | Freq: Every day | ORAL | Status: DC

## 2021-08-28 MED ORDER — METHYLPREDNISOLONE 4 MG PO TABS
8.0000 mg | ORAL_TABLET | Freq: Every day | ORAL | Status: DC
  Administered 2021-09-02 – 2021-09-03 (×2): 8 mg via ORAL
  Filled 2021-08-28 (×3): qty 2

## 2021-08-28 MED ORDER — PRENATAL MULTIVITAMIN CH
1.0000 | ORAL_TABLET | Freq: Every day | ORAL | Status: DC
  Administered 2021-09-01 – 2021-09-02 (×2): 1 via ORAL
  Filled 2021-08-28 (×4): qty 1

## 2021-08-28 MED ORDER — SCOPOLAMINE 1 MG/3DAYS TD PT72
1.0000 | MEDICATED_PATCH | TRANSDERMAL | Status: DC
  Administered 2021-08-30 – 2021-09-02 (×2): 1.5 mg via TRANSDERMAL
  Filled 2021-08-28 (×2): qty 1

## 2021-08-28 MED ORDER — METHYLPREDNISOLONE 16 MG PO TABS
16.0000 mg | ORAL_TABLET | Freq: Every day | ORAL | Status: AC
  Administered 2021-08-30 – 2021-08-31 (×3): 16 mg via ORAL
  Filled 2021-08-28 (×3): qty 1

## 2021-08-28 MED ORDER — METHYLPREDNISOLONE 16 MG PO TABS
16.0000 mg | ORAL_TABLET | Freq: Every day | ORAL | Status: AC
  Administered 2021-08-29 – 2021-09-01 (×4): 16 mg via ORAL
  Filled 2021-08-28 (×4): qty 1

## 2021-08-28 MED ORDER — ACETAMINOPHEN 325 MG PO TABS: 650.0000 mg | ORAL_TABLET | ORAL | Status: DC | PRN

## 2021-08-28 MED ORDER — METHYLPREDNISOLONE SODIUM SUCC 125 MG IJ SOLR
48.0000 mg | Freq: Once | INTRAMUSCULAR | Status: AC
  Administered 2021-08-28: 48 mg via INTRAVENOUS
  Filled 2021-08-28: qty 2

## 2021-08-28 MED ORDER — METHYLPREDNISOLONE 4 MG PO TABS
8.0000 mg | ORAL_TABLET | Freq: Every day | ORAL | Status: AC
  Administered 2021-08-31 – 2021-09-02 (×3): 8 mg via ORAL
  Filled 2021-08-28 (×3): qty 2

## 2021-08-28 MED ORDER — GLYCOPYRROLATE 1 MG PO TABS
1.0000 mg | ORAL_TABLET | Freq: Two times a day (BID) | ORAL | Status: DC
  Administered 2021-08-28 – 2021-09-03 (×13): 1 mg via ORAL
  Filled 2021-08-28 (×13): qty 1

## 2021-08-28 MED ORDER — METHYLPREDNISOLONE 16 MG PO TABS
16.0000 mg | ORAL_TABLET | Freq: Every day | ORAL | Status: AC
  Administered 2021-08-29 – 2021-08-30 (×2): 16 mg via ORAL
  Filled 2021-08-28 (×2): qty 1

## 2021-08-28 MED ORDER — METHYLPREDNISOLONE 4 MG PO TABS
4.0000 mg | ORAL_TABLET | Freq: Every day | ORAL | Status: DC
  Administered 2021-09-03: 4 mg via ORAL
  Filled 2021-08-28: qty 1

## 2021-08-28 MED ORDER — SODIUM CHLORIDE 0.9 % IV SOLN
25.0000 mg | Freq: Once | INTRAVENOUS | Status: AC
  Administered 2021-08-28: 25 mg via INTRAVENOUS
  Filled 2021-08-28: qty 1

## 2021-08-28 MED ORDER — DOCUSATE SODIUM 100 MG PO CAPS: 100.0000 mg | ORAL_CAPSULE | Freq: Two times a day (BID) | ORAL | Status: DC | PRN

## 2021-08-28 NOTE — H&P (Signed)
[facility]TEPART[facility] ADMISSION HISTORY AND PHYSICAL NOTE ? ? ?History of Present Illness: ?Becky Torres is a 31 y.o. at 4036w3d by ultrasound with PMHx notable for chronic hypertension, who is being admitted for management of hyperemesis gravidarum. ?This has been an ongoing issue with the pregnancy. States she has vomited countless times today & unable to keep down her meds. Used phenergan suppository this morning but it didn't help. Took zofran this morning but couldn't keep it down. Applied new scopolamine patch yesterday morning. Hasn't had any meds since 7 am this morning. Can't keep down food or fluids. Also reports excessive salivation & spitting. States her symptoms have not improved with her medications & this is a daily issues. Denies abdominal pain, fever, diarrhea, vaginal bleeding.   ?She has lost 26 lbs since 3/20.  ? ?Patient Active Problem List  ? Diagnosis Date Noted  ? Hyperemesis 08/28/2021  ? Nausea and vomiting during pregnancy 08/26/2021  ? Excessive salivation while pregnant 08/26/2021  ? Sickle cell trait (HCC)   ? Supervision of high risk pregnancy, antepartum 08/11/2021  ? Hypertension 07/24/2012  ? ? ?Past Medical History:  ?Diagnosis Date  ? Anemia   ? Chlamydia   ? Hypertension   ? chronic, diagnosed at age 89, takes Labetalol  ? Sickle cell trait (HCC)   ? ? ?History reviewed. No pertinent surgical history. ? ?OB History  ?Gravida Para Term Preterm AB Living  ?5 3 3  0 1 3  ?SAB IAB Ectopic Multiple Live Births  ?1 0 0 0 3  ?  ?# Outcome Date GA Lbr Len/2nd Weight Sex Delivery Anes PTL Lv  ?5 Current           ?4 Term 12/31/16 8568w2d 01:33 / 00:20 2719 g F Vag-Spont EPI  LIV  ?3 SAB 12/10/15 6926w1d         ?2 Term 09/19/12 2146w4d 14:08 / 00:52 2590 g M Vag-Spont EPI  LIV  ?1 Term 07/26/10 8449w0d   M Vag-Spont EPI N LIV  ? ? ?Social History  ? ?Socioeconomic History  ? Marital status: Single  ?  Spouse name: Not on file  ? Number of children: Not on file  ? Years of education: Not on  file  ? Highest education level: Not on file  ?Occupational History  ? Not on file  ?Tobacco Use  ? Smoking status: Never  ? Smokeless tobacco: Never  ?Vaping Use  ? Vaping Use: Never used  ?Substance and Sexual Activity  ? Alcohol use: No  ? Drug use: No  ? Sexual activity: Yes  ?  Birth control/protection: None  ?Other Topics Concern  ? Not on file  ?Social History Narrative  ? Not on file  ? ?Social Determinants of Health  ? ?Financial Resource Strain: Not on file  ?Food Insecurity: Not on file  ?Transportation Needs: Not on file  ?Physical Activity: Not on file  ?Stress: Not on file  ?Social Connections: Not on file  ? ? ?Family History  ?Problem Relation Age of Onset  ? Hypertension Mother   ? Hypertension Brother   ? Sickle cell anemia Son   ? Hypertension Maternal Aunt   ? Hypertension Maternal Grandmother   ? ? ?No Known Allergies ? ?Medications Prior to Admission  ?Medication Sig Dispense Refill Last Dose  ? acetaminophen (TYLENOL) 325 MG tablet Take 2 tablets (650 mg total) by mouth every 4 (four) hours as needed (for pain scale < 4).   Past Month  ?  glycopyrrolate (ROBINUL) 2 MG tablet Take 0.5 tablets (1 mg total) by mouth 3 (three) times daily as needed. 30 tablet 3 08/27/2021  ? ondansetron (ZOFRAN) 4 MG tablet Take 1 tablet (4 mg total) by mouth every 8 (eight) hours as needed for nausea, vomiting or refractory nausea / vomiting. 20 tablet 1 08/27/2021  ? Prenat-Fe Carbonyl-FA-Omega 3 (ONE-A-DAY WOMENS PRENATAL 1) 28-0.8-235 MG CAPS Take 1 capsule by mouth daily.   Past Month  ? promethazine (PHENERGAN) 25 MG suppository Place 1 suppository (25 mg total) rectally every 6 (six) hours as needed for nausea or vomiting. 12 each 0 08/27/2021  ? scopolamine (TRANSDERM-SCOP) 1 MG/3DAYS Place 1 patch (1.5 mg total) onto the skin every 3 (three) days. 10 patch 12 08/27/2021  ? Doxylamine-Pyridoxine (DICLEGIS) 10-10 MG TBEC Take 1 tablet by mouth daily. Two tablets at bedtime on days 1 and 2; if symptoms persist,  take 1 tablet in morning and 2 tablets at bedtime on day 3; if symptoms persist, may further increase to 1 tablet in morning, 1 tablet mid-afternoon, and 2 tablets at bedtime on day 4 (maximum: doxylamine 40 mg/pyridoxine 40 mg [4 tablets] per day). (Patient not taking: Reported on 08/11/2021) 60 tablet 0 More than a month  ? ? ?Review of Systems - History obtained from chart review and the patient ?General ROS: positive for  - weight loss ?negative for - chills or fever ?Respiratory ROS: no cough, shortness of breath, or wheezing ?Gastrointestinal ROS: positive for - nausea/vomiting ?negative for - abdominal pain, constipation, or diarrhea ?Genito-Urinary ROS: positive for - incontinence and oliguria ?negative for - dysuria or vaginal bleeding ? ?Vitals:  BP (!) 155/109   Pulse (!) 108   Temp 98.2 ?F (36.8 ?C) (Oral)   Resp 18   Ht 5\' 4"  (1.626 m)   Wt 89.2 kg   LMP 06/08/2021   BMI 33.75 kg/m?  ?Physical Examination: ?CONSTITUTIONAL: Well-developed, well-nourished female in no acute distress.  ?HENT:  Normocephalic, atraumatic, External right and left ear normal. Oropharynx is clear and moist ?EYES: Conjunctivae and EOM are normal. Pupils are equal, round, and reactive to light. No scleral icterus.  ?NECK: Normal range of motion, supple, no masses ?SKIN: Skin is warm and dry. No rash noted. Not diaphoretic. No erythema. No pallor. ?NEUROLGIC: Alert and oriented to person, place, and time. Normal reflexes, muscle tone coordination. No cranial nerve deficit noted. ?PSYCHIATRIC: Normal mood and affect. Normal behavior. Normal judgment and thought content. ?CARDIOVASCULAR: Normal heart rate noted, regular rhythm ?RESPIRATORY: Effort and breath sounds normal, no problems with respiration noted ?ABDOMEN: Soft, nontender, nondistended, gravid. ?MUSCULOSKELETAL: Normal range of motion. No edema and no tenderness. 2+ distal pulses. ? ? ? ?Labs:  ?Results for orders placed or performed during the hospital encounter of  08/27/21 (from the past 24 hour(s))  ?CBC  ? Collection Time: 08/27/21 11:45 PM  ?Result Value Ref Range  ? WBC 13.6 (H) 4.0 - 10.5 K/uL  ? RBC 5.84 (H) 3.87 - 5.11 MIL/uL  ? Hemoglobin 13.8 12.0 - 15.0 g/dL  ? HCT 42.2 36.0 - 46.0 %  ? MCV 72.3 (L) 80.0 - 100.0 fL  ? MCH 23.6 (L) 26.0 - 34.0 pg  ? MCHC 32.7 30.0 - 36.0 g/dL  ? RDW 19.8 (H) 11.5 - 15.5 %  ? Platelets 314 150 - 400 K/uL  ? nRBC 0.0 0.0 - 0.2 %  ?Comprehensive metabolic panel  ? Collection Time: 08/27/21 11:45 PM  ?Result Value Ref Range  ? Sodium  133 (L) 135 - 145 mmol/L  ? Potassium 2.8 (L) 3.5 - 5.1 mmol/L  ? Chloride 95 (L) 98 - 111 mmol/L  ? CO2 23 22 - 32 mmol/L  ? Glucose, Bld 123 (H) 70 - 99 mg/dL  ? BUN 10 6 - 20 mg/dL  ? Creatinine, Ser 1.41 (H) 0.44 - 1.00 mg/dL  ? Calcium 9.6 8.9 - 10.3 mg/dL  ? Total Protein 8.7 (H) 6.5 - 8.1 g/dL  ? Albumin 3.7 3.5 - 5.0 g/dL  ? AST 322 (H) 15 - 41 U/L  ? ALT 736 (H) 0 - 44 U/L  ? Alkaline Phosphatase 70 38 - 126 U/L  ? Total Bilirubin 1.1 0.3 - 1.2 mg/dL  ? GFR, Estimated 51 (L) >60 mL/min  ? Anion gap 15 5 - 15  ? ? ?Imaging Studies: ?No results found. ? ? ?Assessment and Plan: ?1. Hyperemesis gravidarum before end of [redacted] week gestation with electrolyte imbalance  ?-IV fluids & antiemetics ?-steroids  ?2. Sialorrhea in antepartum period in first trimester   ?3. Hypokalemia due to excessive gastrointestinal loss of potassium  ?-IV potassium replacement  ?4. Elevated LFTs   ?5. [redacted] weeks gestation of pregnancy   ? ? ?Judeth Horn, NP ?08/28/2021 ?1:38 AM ? ?

## 2021-08-28 NOTE — Plan of Care (Signed)
?  Problem: Education: ?Goal: Knowledge of disease or condition will improve ?08/28/2021 0241 by Bobbye Morton, RN ?Outcome: Progressing ?08/28/2021 0241 by Bobbye Morton, RN ?Outcome: Progressing ?Goal: Knowledge of the prescribed therapeutic regimen will improve ?08/28/2021 0241 by Bobbye Morton, RN ?Outcome: Progressing ?08/28/2021 0241 by Bobbye Morton, RN ?Outcome: Progressing ?Goal: Individualized Educational Video(s) ?08/28/2021 0241 by Bobbye Morton, RN ?Outcome: Progressing ?08/28/2021 0241 by Bobbye Morton, RN ?Outcome: Progressing ?  ?

## 2021-08-28 NOTE — Telephone Encounter (Addendum)
-----   Message from Griffin Basil, MD sent at 08/28/2021  8:54 AM EDT ----- ?BV noted on swab, we will offer treatment ? ?4/28  1020  Called pt and informed her of test result showing +BV. She desires treatment. Pt is currently inpatient and receiving treatment for nausea and vomiting. Rx for Metrogel e-prescribed per standing order. Pt advised to obtain the prescription after d/c from hospital. She voiced understanding.  ?

## 2021-08-28 NOTE — Progress Notes (Signed)
Pt informed that the ultrasound is considered a limited OB ultrasound and is not intended to be a complete ultrasound exam.  Patient also informed that the ultrasound is not being completed with the intent of assessing for fetal or placental anomalies or any pelvic abnormalities.  Explained that the purpose of today?s ultrasound is to assess for  viability.  Patient acknowledges the purpose of the exam and the limitations of the study.   ? ?Live IUP with FHR 152 bpm ? ? ?Judeth Horn, NP  ?

## 2021-08-29 DIAGNOSIS — N179 Acute kidney failure, unspecified: Secondary | ICD-10-CM | POA: Diagnosis present

## 2021-08-29 DIAGNOSIS — O211 Hyperemesis gravidarum with metabolic disturbance: Secondary | ICD-10-CM | POA: Diagnosis not present

## 2021-08-29 DIAGNOSIS — O26831 Pregnancy related renal disease, first trimester: Secondary | ICD-10-CM | POA: Diagnosis not present

## 2021-08-29 DIAGNOSIS — K117 Disturbances of salivary secretion: Secondary | ICD-10-CM | POA: Diagnosis not present

## 2021-08-29 DIAGNOSIS — R748 Abnormal levels of other serum enzymes: Secondary | ICD-10-CM | POA: Diagnosis present

## 2021-08-29 DIAGNOSIS — O99011 Anemia complicating pregnancy, first trimester: Secondary | ICD-10-CM | POA: Diagnosis not present

## 2021-08-29 DIAGNOSIS — Z3A10 10 weeks gestation of pregnancy: Secondary | ICD-10-CM | POA: Diagnosis not present

## 2021-08-29 DIAGNOSIS — O99891 Other specified diseases and conditions complicating pregnancy: Secondary | ICD-10-CM | POA: Diagnosis not present

## 2021-08-29 DIAGNOSIS — E876 Hypokalemia: Secondary | ICD-10-CM | POA: Diagnosis not present

## 2021-08-29 DIAGNOSIS — N390 Urinary tract infection, site not specified: Secondary | ICD-10-CM | POA: Diagnosis not present

## 2021-08-29 DIAGNOSIS — O26611 Liver and biliary tract disorders in pregnancy, first trimester: Secondary | ICD-10-CM | POA: Diagnosis not present

## 2021-08-29 DIAGNOSIS — O2340 Unspecified infection of urinary tract in pregnancy, unspecified trimester: Secondary | ICD-10-CM | POA: Diagnosis present

## 2021-08-29 DIAGNOSIS — K831 Obstruction of bile duct: Secondary | ICD-10-CM | POA: Diagnosis not present

## 2021-08-29 DIAGNOSIS — R1110 Vomiting, unspecified: Secondary | ICD-10-CM | POA: Diagnosis not present

## 2021-08-29 DIAGNOSIS — R7401 Elevation of levels of liver transaminase levels: Secondary | ICD-10-CM | POA: Diagnosis present

## 2021-08-29 DIAGNOSIS — D573 Sickle-cell trait: Secondary | ICD-10-CM | POA: Diagnosis not present

## 2021-08-29 DIAGNOSIS — D571 Sickle-cell disease without crisis: Secondary | ICD-10-CM | POA: Diagnosis not present

## 2021-08-29 DIAGNOSIS — O2341 Unspecified infection of urinary tract in pregnancy, first trimester: Secondary | ICD-10-CM | POA: Diagnosis not present

## 2021-08-29 DIAGNOSIS — O10011 Pre-existing essential hypertension complicating pregnancy, first trimester: Secondary | ICD-10-CM | POA: Diagnosis not present

## 2021-08-29 LAB — CBC/D/PLT+RPR+RH+ABO+RUBIGG...
Antibody Screen: NEGATIVE
Basophils Absolute: 0 10*3/uL (ref 0.0–0.2)
Basos: 0 %
EOS (ABSOLUTE): 0 10*3/uL (ref 0.0–0.4)
Eos: 0 %
HCV Ab: NONREACTIVE
HIV Screen 4th Generation wRfx: NONREACTIVE
Hematocrit: 44.3 % (ref 34.0–46.6)
Hemoglobin: 14 g/dL (ref 11.1–15.9)
Hepatitis B Surface Ag: NEGATIVE
Immature Grans (Abs): 0.1 10*3/uL (ref 0.0–0.1)
Immature Granulocytes: 1 %
Lymphocytes Absolute: 1.5 10*3/uL (ref 0.7–3.1)
Lymphs: 13 %
MCH: 23.3 pg — ABNORMAL LOW (ref 26.6–33.0)
MCHC: 31.6 g/dL (ref 31.5–35.7)
MCV: 74 fL — ABNORMAL LOW (ref 79–97)
Monocytes Absolute: 0.9 10*3/uL (ref 0.1–0.9)
Monocytes: 8 %
Neutrophils Absolute: 8.5 10*3/uL — ABNORMAL HIGH (ref 1.4–7.0)
Neutrophils: 78 %
Platelets: 394 10*3/uL (ref 150–450)
RBC: 6 x10E6/uL — ABNORMAL HIGH (ref 3.77–5.28)
RDW: 20.8 % — ABNORMAL HIGH (ref 11.7–15.4)
RPR Ser Ql: NONREACTIVE
Rh Factor: POSITIVE
Rubella Antibodies, IGG: 21.2 index (ref >0.99)
WBC: 10.9 10*3/uL — ABNORMAL HIGH (ref 3.4–10.8)

## 2021-08-29 LAB — URINALYSIS, MICROSCOPIC (REFLEX): RBC / HPF: NONE SEEN RBC/hpf (ref 0–5)

## 2021-08-29 LAB — HEMOGLOBIN A1C
Est. average glucose Bld gHb Est-mCnc: 105 mg/dL
Hgb A1c MFr Bld: 5.3 % (ref 4.8–5.6)

## 2021-08-29 LAB — URINALYSIS, ROUTINE W REFLEX MICROSCOPIC
Glucose, UA: NEGATIVE mg/dL
Hgb urine dipstick: NEGATIVE
Ketones, ur: NEGATIVE mg/dL
Nitrite: POSITIVE — AB
Protein, ur: NEGATIVE mg/dL
Specific Gravity, Urine: 1.02 (ref 1.005–1.030)
pH: 5.5 (ref 5.0–8.0)

## 2021-08-29 LAB — COMPREHENSIVE METABOLIC PANEL
ALT: 491 U/L — ABNORMAL HIGH (ref 0–44)
AST: 195 U/L — ABNORMAL HIGH (ref 15–41)
Albumin: 2.5 g/dL — ABNORMAL LOW (ref 3.5–5.0)
Alkaline Phosphatase: 44 U/L (ref 38–126)
Anion gap: 7 (ref 5–15)
BUN: <5 mg/dL — ABNORMAL LOW (ref 6–20)
CO2: 26 mmol/L (ref 22–32)
Calcium: 8.2 mg/dL — ABNORMAL LOW (ref 8.9–10.3)
Chloride: 101 mmol/L (ref 98–111)
Creatinine, Ser: 0.94 mg/dL (ref 0.44–1.00)
GFR, Estimated: >60 mL/min (ref >60)
Glucose, Bld: 102 mg/dL — ABNORMAL HIGH (ref 70–99)
Potassium: 2.2 mmol/L — CL (ref 3.5–5.1)
Sodium: 134 mmol/L — ABNORMAL LOW (ref 135–145)
Total Bilirubin: 0.6 mg/dL (ref 0.3–1.2)
Total Protein: 6 g/dL — ABNORMAL LOW (ref 6.5–8.1)

## 2021-08-29 LAB — GLUCOSE, RANDOM: Glucose, Bld: 104 mg/dL — ABNORMAL HIGH (ref 70–99)

## 2021-08-29 LAB — MAGNESIUM: Magnesium: 1.9 mg/dL (ref 1.7–2.4)

## 2021-08-29 LAB — AMYLASE: Amylase: 114 U/L — ABNORMAL HIGH (ref 28–100)

## 2021-08-29 LAB — TSH: TSH: 0.015 u[IU]/mL — ABNORMAL LOW (ref 0.450–4.500)

## 2021-08-29 LAB — HCV INTERPRETATION

## 2021-08-29 MED ORDER — KCL IN DEXTROSE-NACL 40-5-0.45 MEQ/L-%-% IV SOLN
INTRAVENOUS | Status: DC
  Filled 2021-08-29 (×6): qty 1000

## 2021-08-29 MED ORDER — POTASSIUM CHLORIDE CRYS ER 20 MEQ PO TBCR
20.0000 meq | EXTENDED_RELEASE_TABLET | Freq: Two times a day (BID) | ORAL | Status: AC
  Filled 2021-08-29 (×5): qty 1

## 2021-08-29 MED ORDER — POTASSIUM CHLORIDE 10 MEQ/100ML IV SOLN
10.0000 meq | INTRAVENOUS | Status: AC
  Administered 2021-08-29 (×3): 10 meq via INTRAVENOUS
  Filled 2021-08-29 (×3): qty 100

## 2021-08-29 MED ORDER — MAGNESIUM SULFATE 2 GM/50ML IV SOLN
2.0000 g | Freq: Once | INTRAVENOUS | Status: AC
  Administered 2021-08-29: 2 g via INTRAVENOUS
  Filled 2021-08-29: qty 50

## 2021-08-29 MED ORDER — ENOXAPARIN SODIUM 40 MG/0.4ML IJ SOSY
40.0000 mg | PREFILLED_SYRINGE | INTRAMUSCULAR | Status: DC
  Administered 2021-08-29 – 2021-09-03 (×6): 40 mg via SUBCUTANEOUS
  Filled 2021-08-29 (×6): qty 0.4

## 2021-08-29 NOTE — Progress Notes (Addendum)
Daily Antepartum Note  ?Admission Date: 08/27/2021 ?Current Date: 08/29/2021 ?10:30 AM ? ?Becky Torres is a 31 y.o. [id number] @ [redacted]w[redacted]d, admitted for n/v of pregnancy. ? ?Pregnancy complicated by: ?Patient Active Problem List  ? Diagnosis Date Noted  ? Patient has son with Sickle Cell Disease 08/29/2021  ? UTI in pregnancy 08/29/2021  ? Hyperemesis 08/28/2021  ? Nausea and vomiting during pregnancy 08/26/2021  ? Excessive salivation while pregnant 08/26/2021  ? Sickle cell trait (HCC)   ? Supervision of high risk pregnancy, antepartum 08/11/2021  ? Chronic hypertension complicating or reason for care during pregnancy, first trimester 07/24/2012  ? ? ?Overnight/24hr events:  ?none ? ?Subjective:  ?Taking minimal amount of PO liquids. No chest pain, sob, abdominal pain.  ? ?Objective:  ? ? Current Vital Signs 24h Vital Sign Ranges  ?T 98.2 ?F (36.8 ?C) Temp  Avg: 98.3 ?F (36.8 ?C)  Min: 98.1 ?F (36.7 ?C)  Max: 98.6 ?F (37 ?C)  ?BP 131/80 BP  Min: 131/80  Max: 140/78  ?HR 82 Pulse  Avg: 94.4  Min: 82  Max: 102  ?RR 16 Resp  Avg: 17  Min: 16  Max: 18  ?SaO2 98 %  (room air) SpO2  Avg: 98.4 %  Min: 97 %  Max: 100 %  ?    ? 24 Hour I/O Current Shift I/O  ?Time ?Ins ?Outs 04/28 0701 - 04/29 0700 ?In: 2418.7 [P.O.:360; I.V.:2058.7] ?Out: 500 [Urine:300] 04/29 0701 - 04/29 1900 ?In: 0  ?Out: 200 [Urine:200]  ? ?Physical exam: ?General: Well nourished, well developed female in no acute distress. ?Abdomen: diffusely nttp ?Cardiovascular: S1, S2 normal, no murmur, rub or gallop, regular rate and rhythm ?Respiratory: CTAB ?Extremities: no clubbing, cyanosis or edema ?Skin: Warm and dry.  ? ?Medications: ?Current Facility-Administered Medications  ?Medication Dose Route Frequency Provider Last Rate Last Admin  ? dextrose 5 % and 0.45 % NaCl with KCl 10 mEq/L infusion   Intravenous Continuous Hermina Staggers, MD 100 mL/hr at 08/29/21 0738 New Bag at 08/29/21 0738  ? dextrose 5 % and 0.45 % NaCl with KCl 40 mEq/L infusion    Intravenous Continuous [place] Bing, MD      ? enoxaparin (LOVENOX) injection 40 mg  40 mg Subcutaneous Q24H [place] Bing, MD   40 mg at 08/29/21 0941  ? famotidine (PEPCID) tablet 20 mg  20 mg Oral Q12H Hermina Staggers, MD   20 mg at 08/29/21 0941  ? Or  ? famotidine (PEPCID) IVPB 20 mg premix  20 mg Intravenous Q12H Hermina Staggers, MD      ? glycopyrrolate (ROBINUL) tablet 1 mg  1 mg Oral BID Hermina Staggers, MD   1 mg at 08/29/21 0941  ? magnesium sulfate IVPB 2 g 50 mL  2 g Intravenous Once [place] Bing, MD      ? methylPREDNISolone (MEDROL) tablet 16 mg  16 mg Oral Q breakfast Hermina Staggers, MD   16 mg at 08/29/21 0941  ? Followed by  ? [START ON 09/02/2021] methylPREDNISolone (MEDROL) tablet 8 mg  8 mg Oral Q breakfast Hermina Staggers, MD      ? Followed by  ? Melene Muller ON 09/09/2021] methylPREDNISolone (MEDROL) tablet 4 mg  4 mg Oral Q breakfast Hermina Staggers, MD      ? methylPREDNISolone (MEDROL) tablet 16 mg  16 mg Oral Q1400 Hermina Staggers, MD      ? Followed by  ? Melene Muller ON 08/31/2021] methylPREDNISolone (  MEDROL) tablet 8 mg  8 mg Oral Q1400 Hermina Staggers, MD      ? Followed by  ? Melene Muller ON 09/03/2021] methylPREDNISolone (MEDROL) tablet 4 mg  4 mg Oral Q1400 Hermina Staggers, MD      ? methylPREDNISolone (MEDROL) tablet 16 mg  16 mg Oral QHS Hermina Staggers, MD      ? Followed by  ? Melene Muller ON 09/01/2021] methylPREDNISolone (MEDROL) tablet 8 mg  8 mg Oral QHS Hermina Staggers, MD      ? Followed by  ? Melene Muller ON 09/04/2021] methylPREDNISolone (MEDROL) tablet 4 mg  4 mg Oral QHS Hermina Staggers, MD      ? polyethylene glycol (MIRALAX / GLYCOLAX) packet 17 g  17 g Oral Daily PRN [place] Bing, MD      ? potassium chloride 10 mEq in 100 mL IVPB  10 mEq Intravenous Q1 Hr x 3 Clerence Gubser, MD      ? potassium chloride SA (KLOR-CON M) CR tablet 20 mEq  20 mEq Oral BID [place] Bing, MD      ? prenatal multivitamin tablet 1 tablet  1 tablet Oral Q1200 Hermina Staggers, MD      ? Melene Muller ON  08/30/2021] scopolamine (TRANSDERM-SCOP) 1 MG/3DAYS 1.5 mg  1 patch Transdermal Q72H Hermina Staggers, MD      ? vitamin B-6 (pyridOXINE) tablet 25 mg  25 mg Oral BID  Bing, MD   25 mg at 08/29/21 0941  ? zolpidem (AMBIEN) tablet 5 mg  5 mg Oral QHS PRN Hermina Staggers, MD      ? ? ?Labs:  ?Recent Labs  ?Lab 08/26/21 ?1433 08/27/21 ?2345  ?WBC 10.9* 13.6*  ?HGB 14.0 13.8  ?HCT 44.3 42.2  ?PLT 394 314  ? ? ?Recent Labs  ?Lab 08/27/21 ?2345 08/28/21 ?6294 08/29/21 ?0501  ?NA 133*  --  134*  ?K 2.8*  --  2.2*  ?CL 95*  --  101  ?CO2 23  --  26  ?BUN 10  --  <5*  ?CREATININE 1.41*  --  0.94  ?CALCIUM 9.6  --  8.2*  ?PROT 8.7*  --  6.0*  ?BILITOT 1.1  --  0.6  ?ALKPHOS 70  --  44  ?ALT 736*  --  491*  ?AST 322*  --  195*  ?GLUCOSE 123* 126* 102*  104*  ? ?Mg 1.9 ? ?Radiology:  ?No new imaging ? ?Assessment & Plan:  ?Pt stable, worsened hypokalemia ?*Pregnancy: dopplers prior to discharge ?*N/V: continue current medication regiment. 20lbs weight loss since march but stabilizing over past 1-2wks ?*PPx: lovenox ?*FEN/GI: IV K ordered. Will see if she can take PO kdur. Increase KCL in her MIVF. Mg IV also ordered and an ECG. Repeat labs tomorrow. Cmp, amylase improving. Leave on clears ?*Renal: see above. Likely from dehydration ?*ID: UTI a month ago but I don't see any medications sent in. Pt asymptomatic. Will repeat UCx and u/a and see if they do susceptibilities to macrobid.  ?*cHTN: no issues on no meds.  ?*Dispo: when able to take better PO ? ?Cornelia Copa MD ?Attending ?[facility]) ?GYN Consult Phone: [phone] (M-F, [phone]) & [phone]  (Off hours, weekends, holidays) ? ?

## 2021-08-30 ENCOUNTER — Inpatient Hospital Stay (HOSPITAL_COMMUNITY): Payer: Medicaid Other

## 2021-08-30 DIAGNOSIS — K8020 Calculus of gallbladder without cholecystitis without obstruction: Secondary | ICD-10-CM | POA: Diagnosis present

## 2021-08-30 DIAGNOSIS — Z3A10 10 weeks gestation of pregnancy: Secondary | ICD-10-CM | POA: Diagnosis not present

## 2021-08-30 DIAGNOSIS — K828 Other specified diseases of gallbladder: Secondary | ICD-10-CM | POA: Diagnosis present

## 2021-08-30 DIAGNOSIS — O26611 Liver and biliary tract disorders in pregnancy, first trimester: Secondary | ICD-10-CM

## 2021-08-30 DIAGNOSIS — R7989 Other specified abnormal findings of blood chemistry: Secondary | ICD-10-CM | POA: Diagnosis not present

## 2021-08-30 DIAGNOSIS — O99891 Other specified diseases and conditions complicating pregnancy: Secondary | ICD-10-CM | POA: Diagnosis not present

## 2021-08-30 DIAGNOSIS — R945 Abnormal results of liver function studies: Secondary | ICD-10-CM | POA: Diagnosis not present

## 2021-08-30 DIAGNOSIS — O211 Hyperemesis gravidarum with metabolic disturbance: Secondary | ICD-10-CM | POA: Diagnosis not present

## 2021-08-30 DIAGNOSIS — E876 Hypokalemia: Secondary | ICD-10-CM | POA: Diagnosis not present

## 2021-08-30 DIAGNOSIS — R1110 Vomiting, unspecified: Secondary | ICD-10-CM | POA: Diagnosis not present

## 2021-08-30 LAB — COMPREHENSIVE METABOLIC PANEL
ALT: 507 U/L — ABNORMAL HIGH (ref 0–44)
AST: 191 U/L — ABNORMAL HIGH (ref 15–41)
Albumin: 2.6 g/dL — ABNORMAL LOW (ref 3.5–5.0)
Alkaline Phosphatase: 50 U/L (ref 38–126)
Anion gap: 6 (ref 5–15)
BUN: <5 mg/dL — ABNORMAL LOW (ref 6–20)
CO2: 23 mmol/L (ref 22–32)
Calcium: 8.7 mg/dL — ABNORMAL LOW (ref 8.9–10.3)
Chloride: 104 mmol/L (ref 98–111)
Creatinine, Ser: 0.78 mg/dL (ref 0.44–1.00)
GFR, Estimated: >60 mL/min (ref >60)
Glucose, Bld: 116 mg/dL — ABNORMAL HIGH (ref 70–99)
Potassium: 3.6 mmol/L (ref 3.5–5.1)
Sodium: 133 mmol/L — ABNORMAL LOW (ref 135–145)
Total Bilirubin: 0.6 mg/dL (ref 0.3–1.2)
Total Protein: 6.3 g/dL — ABNORMAL LOW (ref 6.5–8.1)

## 2021-08-30 LAB — AMYLASE: Amylase: 150 U/L — ABNORMAL HIGH (ref 28–100)

## 2021-08-30 LAB — MAGNESIUM: Magnesium: 2 mg/dL (ref 1.7–2.4)

## 2021-08-30 LAB — HEPATITIS A ANTIBODY, TOTAL: hep A Total Ab: NONREACTIVE

## 2021-08-30 NOTE — Progress Notes (Signed)
GI to come see. Spoke to Gen Surg and they defer any interventions until PP ? ?Cornelia Copa MD ?Attending ?[facility]) ?GYN Consult Phone: [phone] (M-F, 0800-1700) & [phone] (Off hours, weekends, holidays) ? ?

## 2021-08-30 NOTE — Progress Notes (Signed)
Daily Antepartum Note  ?Admission Date: 08/27/2021 ?Current Date: 08/30/2021 ?9:47 AM ? ?Becky Torres is a 31 y.o. [id number] @ [redacted]w[redacted]d, admitted for n/v of pregnancy. ? ?Pregnancy complicated by: ?Patient Active Problem List  ? Diagnosis Date Noted  ? Patient has son with Sickle Cell Disease 08/29/2021  ? UTI in pregnancy 08/29/2021  ? Elevated amylase 08/29/2021  ? Transaminitis 08/29/2021  ? AKI (acute kidney injury) (HCC) 08/29/2021  ? Hyperemesis 08/28/2021  ? Nausea and vomiting during pregnancy 08/26/2021  ? Excessive salivation while pregnant 08/26/2021  ? Sickle cell trait (HCC)   ? Supervision of high risk pregnancy, antepartum 08/11/2021  ? Chronic hypertension complicating or reason for care during pregnancy, first trimester 07/24/2012  ? ? ?Overnight/24hr events:  ?Ate some panera last night but the broccoli, cheese soup didn't sit well ? ?Subjective:  ?Mild nausea, no vomiting and no OB s/s ? ?Objective:  ? ? Current Vital Signs 24h Vital Sign Ranges  ?T 98 ?F (36.7 ?C) Temp  Avg: 98.3 ?F (36.8 ?C)  Min: 98 ?F (36.7 ?C)  Max: 98.6 ?F (37 ?C)  ?BP 135/79 BP  Min: 135/79  Max: 151/103  ?HR 78 Pulse  Avg: 94.7  Min: 78  Max: 119  ?RR 16 Resp  Avg: 16.6  Min: 16  Max: 18  ?SaO2 98 %  (room air) SpO2  Avg: 98.4 %  Min: 98 %  Max: 99 %  ?    ? 24 Hour I/O Current Shift I/O  ?Time ?Ins ?Outs 04/29 0701 - 04/30 0700 ?In: 2621.3 [P.O.:1080; I.V.:1191.3] ?Out: 1375 [Urine:1150] No intake/output data recorded.  ? ?FHT: 150s ? ?Physical exam: ?General: Well nourished, well developed female in no acute distress. ?Abdomen: diffusely nttp ?Cardiovascular: S1, S2 normal, no murmur, rub or gallop, regular rate and rhythm ?Respiratory: CTAB ?Extremities: no clubbing, cyanosis or edema ?Skin: Warm and dry.  ? ?Medications: ?Current Facility-Administered Medications  ?Medication Dose Route Frequency Provider Last Rate Last Admin  ? dextrose 5 % and 0.45 % NaCl with KCl 40 mEq/L infusion   Intravenous Continuous [place] Bing, MD 100 mL/hr at 08/30/21 0634 Infusion Verify at 08/30/21 0388  ? enoxaparin (LOVENOX) injection 40 mg  40 mg Subcutaneous Q24H [place] Bing, MD   40 mg at 08/29/21 0941  ? famotidine (PEPCID) tablet 20 mg  20 mg Oral Q12H Hermina Staggers, MD   20 mg at 08/30/21 0020  ? Or  ? famotidine (PEPCID) IVPB 20 mg premix  20 mg Intravenous Q12H Hermina Staggers, MD      ? glycopyrrolate (ROBINUL) tablet 1 mg  1 mg Oral BID Hermina Staggers, MD   1 mg at 08/30/21 0021  ? methylPREDNISolone (MEDROL) tablet 16 mg  16 mg Oral Q breakfast Hermina Staggers, MD   16 mg at 08/30/21 0844  ? Followed by  ? [START ON 09/02/2021] methylPREDNISolone (MEDROL) tablet 8 mg  8 mg Oral Q breakfast Hermina Staggers, MD      ? Followed by  ? Melene Muller ON 09/09/2021] methylPREDNISolone (MEDROL) tablet 4 mg  4 mg Oral Q breakfast Hermina Staggers, MD      ? methylPREDNISolone (MEDROL) tablet 16 mg  16 mg Oral Q1400 Hermina Staggers, MD   16 mg at 08/29/21 1456  ? Followed by  ? [START ON 08/31/2021] methylPREDNISolone (MEDROL) tablet 8 mg  8 mg Oral Q1400 Hermina Staggers, MD      ? Followed by  ? Melene Muller  ON 09/03/2021] methylPREDNISolone (MEDROL) tablet 4 mg  4 mg Oral Q1400 Hermina Staggers, MD      ? methylPREDNISolone (MEDROL) tablet 16 mg  16 mg Oral QHS Hermina Staggers, MD   16 mg at 08/30/21 0021  ? Followed by  ? [START ON 09/01/2021] methylPREDNISolone (MEDROL) tablet 8 mg  8 mg Oral QHS Hermina Staggers, MD      ? Followed by  ? Melene Muller ON 09/04/2021] methylPREDNISolone (MEDROL) tablet 4 mg  4 mg Oral QHS Hermina Staggers, MD      ? polyethylene glycol (MIRALAX / GLYCOLAX) packet 17 g  17 g Oral Daily PRN [place] Bing, MD      ? potassium chloride SA (KLOR-CON M) CR tablet 20 mEq  20 mEq Oral BID [place] Bing, MD      ? prenatal multivitamin tablet 1 tablet  1 tablet Oral Q1200 Hermina Staggers, MD      ? scopolamine (TRANSDERM-SCOP) 1 MG/3DAYS 1.5 mg  1 patch Transdermal Q72H Hermina Staggers, MD      ? vitamin B-6 (pyridOXINE)  tablet 25 mg  25 mg Oral BID [place] Bing, MD   25 mg at 08/30/21 0020  ? zolpidem (AMBIEN) tablet 5 mg  5 mg Oral QHS PRN Hermina Staggers, MD      ? ? ?Labs:  ?Recent Labs  ?Lab 08/27/21 ?2345 08/28/21 ?4782 08/29/21 ?0501 08/30/21 ?0503  ?NA 133*  --  134* 133*  ?K 2.8*  --  2.2* 3.6  ?CL 95*  --  101 104  ?CO2 23  --  26 23  ?BUN 10  --  <5* <5*  ?CREATININE 1.41*  --  0.94 0.78  ?CALCIUM 9.6  --  8.2* 8.7*  ?PROT 8.7*  --  6.0* 6.3*  ?BILITOT 1.1  --  0.6 0.6  ?ALKPHOS 70  --  44 50  ?ALT 736*  --  491* 507*  ?AST 322*  --  195* 191*  ?GLUCOSE 123* 126* 102*  104* 116*  ? ?Amylase: 165>114>150 ? ?Mg 2 ? ?Radiology:  ?No new imaging ? ?Assessment & Plan:  ?Pt improving ?*Pregnancy: fetal status reassuring ?*N/V: continue current medication regimen. Will get ruq u/s given labs this morning but pt is feeling well and anticipate d/c to home today.  ?*PPx: lovenox ?*FEN/GI: NPO until ruq u/s and then low fat diet ?*Renal: AKI resolved.  ?*ID: f/u UCx for possible treatment and see about macrobid susceptibilities per ID ?*cHTN: no issues on no meds. Some mild range BPs but pt is on steroids. Hold off on treatment for now ?*Dispo: likely today to complete steroid taper ? ?Cornelia Copa MD ?Attending ?[facility]) ?GYN Consult Phone: [phone] (M-F, [phone]) & [phone]  (Off hours, weekends, holidays) ? ?

## 2021-08-30 NOTE — Consult Note (Addendum)
? ?                                            Consultation Note ? ? ?Referring Provider: Arlina Robes, MD ?Primary Gastroenterologist: Becky Torres ?Reason for consultation: Elevated liver chemistries  ?Hospital Day: 4 ? ?ASSESSMENT:  ? ?31 yo female who is [redacted] weeks gestation admitted with hyperemesis gravidarum ?Symptoms improving with anti-emetics and steroids.  ?Minute amount of blood in emesis, not overly concerning in setting of severe vomiting.  ? ?Elevated amylase.  ?Possibly related to vomiting and / or pregnancy. Low suspicion for pancreatitis in absence of pain and a normal lipase.  ? ?Elevated liver enzymes. Hepatocellular liver injury. Unclear etiology ?AST stable now in 190's, AST 507. Normal alk phos and bilirubin. Cannot correlate with any medications. Hyperemesis gravidarum can cause some elevation in liver enzymes. No liver abnormalities on [place]. She does have cholelithiasis, mild wall thickening but o/w no evidence for acute cholecystitis on [place].   ? ?Cholelithiasis, seems asymptomatic at this point ? ?UTI >> > 100 K  E.Coli ?Currently not on antibiotics but appears OB may be starting macrobid ? ?See PMH for additional medical problems ? ? ?PLAN:  ?HBsAg is negative. HCV negative. Will obtain labs for HAV ?PT/INR ?Repeat LFTs in am.  ?Continue Pepcid.  ?Will follow up tomorrow.  ? ? ?Attending Physician Note  ? ?I have taken a history, reviewed the chart and examined the patient. I performed a substantive portion of this encounter, including complete performance of at least one of the key components, in conjunction with the APP. I agree with the APP's note, impression and recommendations with my edits. My additional impressions and recommendations are as follows.  ? ?*Hyperemesis gravidarum, improving  ?*Elevated LFTs likely due to HG. Check HAV IgM. PT/INR. Trend LFTs.  ?*Cholelithiasis and sludge, asymptomatic, CBD not dilated. Doubt choledocholithiasis. If abdominal pain develops consider  cholelithiasis. If LFTs remain elevated and biliary dilation develops and/or pain develops proceed with MRCP.  ?*Mildly elevated amylase, normal lipase. Elevated amylase due to pregnancy or emesis.  ?*UTI, per primary service ? ?Becky Edward, MD [facility] ?See [facility], [facility], for our on call provider  ? ? ? ? ?History of Present Illness:  ?Becky Torres is a 31 y.o. female with a past medical history significant for HTN, cholelithiasis   See PMH for any additional medical problems. ? ?Patient admitted 08/28/21 for nausea / vomiting She is [redacted] weeks gestation.  Felt to have hyperemesis gravidarum which is improving with steroids. Labs showed elevated lipase and elevated liver chemistries.   ? ?AST 322 >> 195>> 191 ?ALT 736>> 491 >> 507 ?Alk phos and T. Bili normal  ? ?Abd  US shows gallbladder sludge and cholelithiasis with mild wall thickening.  ? ?Becky Torres has no personal or family history of liver disease. She takes Zofran, scopolamine patch , Diclegis, and a MV at home. No herbs or supplements. No tylenol use. No illicit drug use. She hasn't had any abdominal pain. She arthralgias or fevers. She hasn't had mild constipation with the Zofran. Some streaks of blood in emesis but no blood in stool or melena. No liver problems with previous pregnancies ? ?Previous GI Evaluation / History   ?none ? ? ?Recent Labs and Imaging ?US Abdomen Limited RUQ (LIVER/GB) ? ?Result Date: 08/30/2021 ?CLINICAL DATA:  Abnormal LFTs EXAM: ULTRASOUND ABDOMEN LIMITED RIGHT  UPPER QUADRANT COMPARISON:  None. FINDINGS: Gallbladder: Sludge is present. An 8 mm stone is noted. Mild wall thickening measuring 4 mm. No pericholecystic fluid. No sonographic Murphy sign noted by sonographer. Common bile duct: Diameter: Within normal limits Liver: No focal lesion identified. Within normal limits in parenchymal echogenicity. Portal vein is patent on color Doppler imaging with normal direction of blood flow towards the liver. Other: None. IMPRESSION:  Gallbladder sludge and cholelithiasis with mild wall thickening. No additional evidence of acute cholecystitis. Electronically Signed   By: Becky Torres M.D.   On: 08/30/2021 10:12   ? ?Labs:  ?Recent Labs  ?  08/27/21 ?2345  ?WBC 13.6*  ?HGB 13.8  ?HCT 42.2  ?PLT 314  ? ?Recent Labs  ?  08/27/21 ?2345 08/28/21 ?3154 08/29/21 ?0501 08/30/21 ?0503  ?NA 133*  --  134* 133*  ?K 2.8*  --  2.2* 3.6  ?CL 95*  --  101 104  ?CO2 23  --  26 23  ?GLUCOSE 123* 126* 102*  104* 116*  ?BUN 10  --  <5* <5*  ?CREATININE 1.41*  --  0.94 0.78  ?CALCIUM 9.6  --  8.2* 8.7*  ? ?Recent Labs  ?  08/30/21 ?0503  ?PROT 6.3*  ?ALBUMIN 2.6*  ?AST 191*  ?ALT 507*  ?ALKPHOS 50  ?BILITOT 0.6  ? ?No results for input(s): HEPBSAG, HCVAB, HEPAIGM, HEPBIGM in the last 72 hours. ?No results for input(s): LABPROT, INR in the last 72 hours. ? ?Past Medical History:  ?Diagnosis Date  ? Anemia   ? Chlamydia   ? Hypertension   ? chronic, diagnosed at age 14, takes Labetalol  ? Sickle cell trait ([place])   ? ? ?History reviewed. No pertinent surgical history. ? ?Family History  ?Problem Relation Age of Onset  ? Hypertension Mother   ? Hypertension Brother   ? Sickle cell anemia Son   ? Hypertension Maternal Aunt   ? Hypertension Maternal Grandmother   ? ? ?Prior to Admission medications   ?Medication Sig Start Date End Date Taking? Authorizing Provider  ?acetaminophen (TYLENOL) 325 MG tablet Take 2 tablets (650 mg total) by mouth every 4 (four) hours as needed (for pain scale < 4). 01/01/17  Yes Becky Torres, CNM  ?glycopyrrolate (ROBINUL) 2 MG tablet Take 0.5 tablets (1 mg total) by mouth 3 (three) times daily as needed. 08/26/21  Yes Becky Basil, MD  ?ondansetron (ZOFRAN) 4 MG tablet Take 1 tablet (4 mg total) by mouth every 8 (eight) hours as needed for nausea, vomiting or refractory nausea / vomiting. 08/19/21 09/18/21 Yes Becky Torres, CNM  ?Prenat-Fe Carbonyl-FA-Omega 3 (ONE-A-DAY WOMENS PRENATAL 1) 28-0.8-235 MG CAPS Take 1 capsule by mouth  daily.   Yes [provider]  ?promethazine (PHENERGAN) 25 MG suppository Place 1 suppository (25 mg total) rectally every 6 (six) hours as needed for nausea or vomiting. 08/26/21  Yes Becky Basil, MD  ?scopolamine (TRANSDERM-SCOP) 1 MG/3DAYS Place 1 patch (1.5 mg total) onto the skin every 3 (three) days. 08/19/21  Yes Becky Torres, CNM  ?Doxylamine-Pyridoxine (DICLEGIS) 10-10 MG TBEC Take 1 tablet by mouth daily. Two tablets at bedtime on days 1 and 2; if symptoms persist, take 1 tablet in morning and 2 tablets at bedtime on day 3; if symptoms persist, may further increase to 1 tablet in morning, 1 tablet mid-afternoon, and 2 tablets at bedtime on day 4 (maximum: doxylamine 40 mg/pyridoxine 40 mg [4 tablets] per day). ?Patient not taking:  Reported on 08/11/2021 08/09/21   Renard Matter, MD  ?metroNIDAZOLE (METROGEL VAGINAL) 0.75 % vaginal gel Place 1 Applicatorful vaginally at bedtime for 5 days. 08/28/21 09/02/21  Becky Basil, MD  ? ? ?Current Facility-Administered Medications  ?Medication Dose Route Frequency Provider Last Rate Last Admin  ? dextrose 5 % and 0.45 % NaCl with KCl 40 mEq/L infusion   Intravenous Continuous Aletha Halim, MD 100 mL/hr at 08/30/21 1027 New Bag at 08/30/21 1027  ? enoxaparin (LOVENOX) injection 40 mg  40 mg Subcutaneous Q24H Aletha Halim, MD   40 mg at 08/30/21 1021  ? famotidine (PEPCID) tablet 20 mg  20 mg Oral Q12H Chancy Milroy, MD   20 mg at 08/30/21 1021  ? Or  ? famotidine (PEPCID) IVPB 20 mg premix  20 mg Intravenous Q12H Chancy Milroy, MD      ? glycopyrrolate (ROBINUL) tablet 1 mg  1 mg Oral BID Chancy Milroy, MD   1 mg at 08/30/21 1020  ? methylPREDNISolone (MEDROL) tablet 16 mg  16 mg Oral Q breakfast Chancy Milroy, MD   16 mg at 08/30/21 0844  ? Followed by  ? [START ON 09/02/2021] methylPREDNISolone (MEDROL) tablet 8 mg  8 mg Oral Q breakfast Chancy Milroy, MD      ? Followed by  ? Derrill Memo ON 09/09/2021] methylPREDNISolone (MEDROL) tablet  4 mg  4 mg Oral Q breakfast Chancy Milroy, MD      ? methylPREDNISolone (MEDROL) tablet 16 mg  16 mg Oral Q1400 Chancy Milroy, MD   16 mg at 08/29/21 1456  ? Followed by  ? [START ON 08/31/2021] methylPREDNIS

## 2021-08-31 DIAGNOSIS — R1110 Vomiting, unspecified: Secondary | ICD-10-CM | POA: Diagnosis not present

## 2021-08-31 DIAGNOSIS — R7401 Elevation of levels of liver transaminase levels: Secondary | ICD-10-CM | POA: Diagnosis not present

## 2021-08-31 DIAGNOSIS — Z419 Encounter for procedure for purposes other than remedying health state, unspecified: Secondary | ICD-10-CM | POA: Diagnosis not present

## 2021-08-31 DIAGNOSIS — Z3A10 10 weeks gestation of pregnancy: Secondary | ICD-10-CM | POA: Diagnosis not present

## 2021-08-31 DIAGNOSIS — R7989 Other specified abnormal findings of blood chemistry: Secondary | ICD-10-CM | POA: Diagnosis not present

## 2021-08-31 LAB — CULTURE, OB URINE: Culture: >=100000 — AB

## 2021-08-31 LAB — COMPREHENSIVE METABOLIC PANEL
ALT: 535 U/L — ABNORMAL HIGH (ref 0–44)
AST: 196 U/L — ABNORMAL HIGH (ref 15–41)
Albumin: 2.6 g/dL — ABNORMAL LOW (ref 3.5–5.0)
Alkaline Phosphatase: 52 U/L (ref 38–126)
Anion gap: 5 (ref 5–15)
BUN: 6 mg/dL (ref 6–20)
CO2: 22 mmol/L (ref 22–32)
Calcium: 8.7 mg/dL — ABNORMAL LOW (ref 8.9–10.3)
Chloride: 104 mmol/L (ref 98–111)
Creatinine, Ser: 0.81 mg/dL (ref 0.44–1.00)
GFR, Estimated: >60 mL/min (ref >60)
Glucose, Bld: 121 mg/dL — ABNORMAL HIGH (ref 70–99)
Potassium: 4.5 mmol/L (ref 3.5–5.1)
Sodium: 131 mmol/L — ABNORMAL LOW (ref 135–145)
Total Bilirubin: 0.4 mg/dL (ref 0.3–1.2)
Total Protein: 6.3 g/dL — ABNORMAL LOW (ref 6.5–8.1)

## 2021-08-31 LAB — PROTIME-INR
INR: 1.1 (ref 0.8–1.2)
Prothrombin Time: 14.5 seconds (ref 11.4–15.2)

## 2021-08-31 LAB — CYTOLOGY - PAP
Comment: NEGATIVE
Comment: NEGATIVE
Comment: NEGATIVE
Diagnosis: UNDETERMINED — AB
HPV 16: POSITIVE — AB
HPV 18 / 45: NEGATIVE
High risk HPV: POSITIVE — AB

## 2021-08-31 LAB — HEPATITIS PANEL, ACUTE
HCV Ab: NONREACTIVE
Hep A IgM: NONREACTIVE
Hep B C IgM: NONREACTIVE
Hepatitis B Surface Ag: NONREACTIVE

## 2021-08-31 LAB — AMYLASE: Amylase: 267 U/L — ABNORMAL HIGH (ref 28–100)

## 2021-08-31 MED ORDER — SODIUM CHLORIDE 0.9 % IV SOLN
2.0000 g | INTRAVENOUS | Status: DC
  Administered 2021-08-31 – 2021-09-02 (×3): 2 g via INTRAVENOUS
  Filled 2021-08-31 (×3): qty 20

## 2021-08-31 MED ORDER — SODIUM CHLORIDE 0.9 % IV SOLN
25.0000 mg | Freq: Four times a day (QID) | INTRAVENOUS | Status: DC | PRN
  Administered 2021-09-01 (×2): 25 mg via INTRAVENOUS
  Filled 2021-08-31 (×2): qty 1

## 2021-08-31 MED ORDER — DEXTROSE-NACL 5-0.45 % IV SOLN: INTRAVENOUS | Status: DC

## 2021-08-31 NOTE — Progress Notes (Signed)
States she ate 3 spoonfuls of scrambled egg and 3 spoonfuls of applesauce along with half a graham cracker 4 hours ago and has had no emesis since. ?

## 2021-08-31 NOTE — Progress Notes (Signed)
? ? ? ? Progress Note ? ? Subjective  ?Patient states she had recurrent vomiting / nausea overnight after trying to eat, but she feels much better today. On steroids. LAEs remain elevated. She denies any pain whatsoever.  ? ? Objective  ? ?Vital signs in last 24 hours: ?Temp:  [98 ?F (36.7 ?C)-98.7 ?F (37.1 ?C)] 98 ?F (36.7 ?C) (05/01 1118) ?Pulse Rate:  [82-108] 82 (05/01 1118) ?Resp:  [15-18] 18 (05/01 1118) ?BP: (140-142)/(87-91) 142/89 (05/01 1118) ?SpO2:  [97 %-100 %] 98 % (05/01 1118) ?Weight:  [93.7 kg] 93.7 kg (05/01 0553) ?Last BM Date : 09/28/21 ?General:    AA female in NAD ?Neurologic:  Alert and oriented,  grossly normal neurologically. ?Psych:  Cooperative. Normal mood and affect. ? ?Intake/Output from previous day: ?04/30 0701 - 05/01 0700 ?In: 2859.5 [P.O.:590; I.V.:2269.5] ?Out: 2000 [Urine:1750; Emesis/NG output:250] ?Intake/Output this shift: ?Total I/O ?In: 1838.6 [P.O.:240; I.V.:1598.6] ?Out: 800 [Urine:800] ? ?Lab Results: ?No results for input(s): WBC, HGB, HCT, PLT in the last 72 hours. ?BMET ?Recent Labs  ?  08/29/21 ?0501 08/30/21 ?0503 08/31/21 ?0431  ?NA 134* 133* 131*  ?K 2.2* 3.6 4.5  ?CL 101 104 104  ?CO2 26 23 22   ?GLUCOSE 102*  104* 116* 121*  ?BUN <5* <5* 6  ?CREATININE 0.94 0.78 0.81  ?CALCIUM 8.2* 8.7* 8.7*  ? ?LFT ?Recent Labs  ?  08/31/21 ?0431  ?PROT 6.3*  ?ALBUMIN 2.6*  ?AST 196*  ?ALT 535*  ?ALKPHOS 52  ?BILITOT 0.4  ? ?PT/INR ?Recent Labs  ?  08/31/21 ?0431  ?LABPROT 14.5  ?INR 1.1  ? ? ?Studies/Results: ?10/31/21 Abdomen Limited RUQ (LIVER/GB) ? ?Result Date: 08/30/2021 ?CLINICAL DATA:  Abnormal LFTs EXAM: ULTRASOUND ABDOMEN LIMITED RIGHT UPPER QUADRANT COMPARISON:  None. FINDINGS: Gallbladder: Sludge is present. An 8 mm stone is noted. Mild wall thickening measuring 4 mm. No pericholecystic fluid. No sonographic Murphy sign noted by sonographer. Common bile duct: Diameter: Within normal limits Liver: No focal lesion identified. Within normal limits in parenchymal echogenicity.  Portal vein is patent on color Doppler imaging with normal direction of blood flow towards the liver. Other: None. IMPRESSION: Gallbladder sludge and cholelithiasis with mild wall thickening. No additional evidence of acute cholecystitis. Electronically Signed   By: 09/01/2021 M.D.   On: 08/30/2021 10:12   ? ? ? ? Assessment / Plan:   ? ?31 y/o female pregnant [redacted] weeks, admitted with hyperemesis gravidarum after failing outpatient regimen. Found to have newly elevated liver enzymes with ALT initially in 700s and AST in 300s. They are improved but remain elevated, ALT  at 535, slightly higher than yesterday, AST 196. AP and bili are normal. She has patent vasculature on 26. Gallstones noted but biliary tree nondilated. She is not in any pain. No new meds she was on to cause this, denies supplement use. Do not think gallstones are related to this current presentation. ? ?She did have recurrent nausea overnight but seems better today. Transaminitis is hopefully just related to her hyperemesis. Other hepatic disorders of specific to pregnancy usually occur much later in the course of pregnancy. She has no evidence of hemolysis or thrombocytopenia. However, her transaminitis is a bit higher than would typically see with hyperemesis. Once her vomiting is improved, her enzymes should downtrend if related, rather quickly. We don't have any recent LFTs on file, however, unclear how long this has been going on. She tested negative for acute viral hepatitis but will check hep E and other  serologies to ensure no other chronic liver disease in the background that could be related. She agrees, hopefully she continues with supportive care. Will follow. Do not need to trend lipase / amylase, likely related to her vomiting. ? ?Plan: ?- continue regimen for hyperemesis ?- trend LFTs, repeat in the AM ?- will send serologies to rule out other causes of chronic liver disease to make sure okay ? ?Call with questions. ? ?Harlin Rain, MD ?[facility] ? ?

## 2021-08-31 NOTE — Progress Notes (Signed)
Daily Antepartum Note  ?Admission Date: 08/27/2021 ?Current Date: 08/31/2021 ?10:59 AM ? ?Becky Torres is a 31 y.o. [id number] @ [redacted]w[redacted]d, admitted for n/v of pregnancy. ? ?Pregnancy complicated by: ?Patient Active Problem List  ? Diagnosis Date Noted  ? Cholelithiasis affecting pregnancy in first trimester, antepartum 08/30/2021  ? Gallbladder sludge 08/30/2021  ? Patient has son with Sickle Cell Disease 08/29/2021  ? UTI in pregnancy 08/29/2021  ? Elevated amylase 08/29/2021  ? Transaminitis 08/29/2021  ? AKI (acute kidney injury) (HCC) 08/29/2021  ? Hyperemesis 08/28/2021  ? Nausea and vomiting during pregnancy 08/26/2021  ? Excessive salivation while pregnant 08/26/2021  ? Sickle cell trait (HCC)   ? Supervision of high risk pregnancy, antepartum 08/11/2021  ? Chronic hypertension complicating or reason for care during pregnancy, first trimester 07/24/2012  ? ? ?Overnight/24hr events:  ?Threw up last night after eating outside food. ? ?Subjective:  ?Mild nausea, no vomiting and no OB s/s. Has tolerated a little applesauce and crackers this morning so far. ? ?Objective:  ? ? Current Vital Signs 24h Vital Sign Ranges  ?T 98.5 ?F (36.9 ?C) Temp  Avg: 98.4 ?F (36.9 ?C)  Min: 98.1 ?F (36.7 ?C)  Max: 98.7 ?F (37.1 ?C)  ?BP (!) 142/87 BP  Min: 140/90  Max: 142/87  ?HR 83 Pulse  Avg: 87.6  Min: 82  Max: 108  ?RR 16 Resp  Avg: 16.8  Min: 15  Max: 18  ?SaO2 97 %  (Room Air) SpO2  Avg: 99 %  Min: 97 %  Max: 100 %  ?    ? 24 Hour I/O Current Shift I/O  ?Time ?Ins ?Outs 04/30 0701 - 05/01 0700 ?In: 2859.5 [P.O.:590; I.V.:2269.5] ?Out: 2000 [Urine:1750] 05/01 0701 - 05/01 1900 ?In: 1598.6 [I.V.:1598.6] ?Out: 300 [Urine:300]  ? ?FHT: 150s ? ?Physical exam: ?General: Well nourished, well developed female in no acute distress. ?Abdomen: diffusely nttp ?Cardiovascular: S1, S2 normal, no murmur, rub or gallop, regular rate and rhythm ?Respiratory: CTAB ?Extremities: no clubbing, cyanosis or edema ?Skin: Warm and dry.   ? ?Medications: ?Current Facility-Administered Medications  ?Medication Dose Route Frequency Provider Last Rate Last Admin  ? cefTRIAXone (ROCEPHIN) 2 g in sodium chloride 0.9 % 100 mL IVPB  2 g Intravenous Q24H Brittain Smithey A, MD      ? dextrose 5 %-0.45 % sodium chloride infusion   Intravenous Continuous Leslee Suire A, MD      ? enoxaparin (LOVENOX) injection 40 mg  40 mg Subcutaneous Q24H Energy Bing, MD   40 mg at 08/31/21 0917  ? famotidine (PEPCID) tablet 20 mg  20 mg Oral Q12H Hermina Staggers, MD   20 mg at 08/31/21 0919  ? Or  ? famotidine (PEPCID) IVPB 20 mg premix  20 mg Intravenous Q12H Hermina Staggers, MD      ? glycopyrrolate (ROBINUL) tablet 1 mg  1 mg Oral BID Hermina Staggers, MD   1 mg at 08/31/21 0919  ? methylPREDNISolone (MEDROL) tablet 16 mg  16 mg Oral Q breakfast Hermina Staggers, MD   16 mg at 08/31/21 9983  ? Followed by  ? [START ON 09/02/2021] methylPREDNISolone (MEDROL) tablet 8 mg  8 mg Oral Q breakfast Hermina Staggers, MD      ? Followed by  ? Melene Muller ON 09/09/2021] methylPREDNISolone (MEDROL) tablet 4 mg  4 mg Oral Q breakfast Hermina Staggers, MD      ? methylPREDNISolone (MEDROL) tablet 16 mg  16 mg Oral  QHS Hermina Staggers, MD   16 mg at 08/30/21 2208  ? Followed by  ? [START ON 09/01/2021] methylPREDNISolone (MEDROL) tablet 8 mg  8 mg Oral QHS Hermina Staggers, MD      ? Followed by  ? Melene Muller ON 09/04/2021] methylPREDNISolone (MEDROL) tablet 4 mg  4 mg Oral QHS Hermina Staggers, MD      ? methylPREDNISolone (MEDROL) tablet 8 mg  8 mg Oral Q1400 Hermina Staggers, MD      ? Followed by  ? Melene Muller ON 09/03/2021] methylPREDNISolone (MEDROL) tablet 4 mg  4 mg Oral Q1400 Hermina Staggers, MD      ? polyethylene glycol (MIRALAX / GLYCOLAX) packet 17 g  17 g Oral Daily PRN [place] Bing, MD      ? potassium chloride SA (KLOR-CON M) CR tablet 20 mEq  20 mEq Oral BID [place] Bing, MD      ? prenatal multivitamin tablet 1 tablet  1 tablet Oral Q1200 Hermina Staggers, MD      ?  scopolamine (TRANSDERM-SCOP) 1 MG/3DAYS 1.5 mg  1 patch Transdermal Q72H Hermina Staggers, MD   1.5 mg at 08/30/21 1022  ? vitamin B-6 (pyridOXINE) tablet 25 mg  25 mg Oral BID [place] Bing, MD   25 mg at 08/31/21 3875  ? zolpidem (AMBIEN) tablet 5 mg  5 mg Oral QHS PRN Hermina Staggers, MD      ? ? ?Labs:  ?Recent Labs  ?Lab 08/29/21 ?0501 08/30/21 ?0503 08/31/21 ?0431  ?NA 134* 133* 131*  ?K 2.2* 3.6 4.5  ?CL 101 104 104  ?CO2 26 23 22   ?BUN <5* <5* 6  ?CREATININE 0.94 0.78 0.81  ?CALCIUM 8.2* 8.7* 8.7*  ?PROT 6.0* 6.3* 6.3*  ?BILITOT 0.6 0.6 0.4  ?ALKPHOS 44 50 52  ?ALT 491* 507* 535*  ?AST 195* 191* 196*  ?GLUCOSE 102*  104* 116* 121*  ? ?Amylase: 165>114>150>267 ? ?Urine Cx:  Pansensitive E.colic (>100K colonies) ? ?Radiology:  ? Abdomen Limited RUQ (LIVER/GB) ? ?Result Date: 08/30/2021 ?CLINICAL DATA:  Abnormal LFTs EXAM: ULTRASOUND ABDOMEN LIMITED RIGHT UPPER QUADRANT COMPARISON:  None. FINDINGS: Gallbladder: Sludge is present. An 8 mm stone is noted. Mild wall thickening measuring 4 mm. No pericholecystic fluid. No sonographic Murphy sign noted by sonographer. Common bile duct: Diameter: Within normal limits Liver: No focal lesion identified. Within normal limits in parenchymal echogenicity. Portal vein is patent on color Doppler imaging with normal direction of blood flow towards the liver. Other: None. IMPRESSION: Gallbladder sludge and cholelithiasis with mild wall thickening. No additional evidence of acute cholecystitis. Electronically Signed   By: 09/01/2021 M.D.   On: 08/30/2021 10:12   ? ? ?Assessment & Plan:  ?Pt improving ?*Pregnancy: fetal status reassuring ?*N/V: continue current medication regimen. Ruq u/s showed gallballder sludge and 8 mm stone, no acute cholecystitis. Gen Surg feels no need for urgent surgical intervention. Continue to monitor LFTs.   ?*PPx: lovenox ?*FEN/GI: Continue low fat diet ?*Renal: AKI resolved.  ?*ID: Started Ceftriaxone for UTI for now, will transition to  [facility] ?*cHTN: no issues on no meds. Some mild range BPs but pt is on steroids. Hold off on treatment for now ?*Dispo: likely tomorrow if remains stable ? ?[facility], MD ?Attending ?[facility] for [facility] [employer]) ?GYN Consult Phone: [phone] (M-F, 0800-1700) & [phone]  (Off hours, weekends, holidays) ? ?

## 2021-09-01 DIAGNOSIS — R7401 Elevation of levels of liver transaminase levels: Secondary | ICD-10-CM | POA: Diagnosis not present

## 2021-09-01 DIAGNOSIS — R7989 Other specified abnormal findings of blood chemistry: Secondary | ICD-10-CM | POA: Diagnosis present

## 2021-09-01 DIAGNOSIS — Z3A10 10 weeks gestation of pregnancy: Secondary | ICD-10-CM | POA: Diagnosis not present

## 2021-09-01 DIAGNOSIS — R1110 Vomiting, unspecified: Secondary | ICD-10-CM | POA: Diagnosis not present

## 2021-09-01 LAB — COMPREHENSIVE METABOLIC PANEL
ALT: 534 U/L — ABNORMAL HIGH (ref 0–44)
AST: 179 U/L — ABNORMAL HIGH (ref 15–41)
Albumin: 2.6 g/dL — ABNORMAL LOW (ref 3.5–5.0)
Alkaline Phosphatase: 48 U/L (ref 38–126)
Anion gap: 7 (ref 5–15)
BUN: 8 mg/dL (ref 6–20)
CO2: 23 mmol/L (ref 22–32)
Calcium: 8.7 mg/dL — ABNORMAL LOW (ref 8.9–10.3)
Chloride: 101 mmol/L (ref 98–111)
Creatinine, Ser: 0.77 mg/dL (ref 0.44–1.00)
GFR, Estimated: >60 mL/min (ref >60)
Glucose, Bld: 101 mg/dL — ABNORMAL HIGH (ref 70–99)
Potassium: 3.4 mmol/L — ABNORMAL LOW (ref 3.5–5.1)
Sodium: 131 mmol/L — ABNORMAL LOW (ref 135–145)
Total Bilirubin: 0.1 mg/dL — ABNORMAL LOW (ref 0.3–1.2)
Total Protein: 6 g/dL — ABNORMAL LOW (ref 6.5–8.1)

## 2021-09-01 LAB — GLUCOSE, CAPILLARY: Glucose-Capillary: 103 mg/dL — ABNORMAL HIGH (ref 70–99)

## 2021-09-01 LAB — ANA W/REFLEX IF POSITIVE: Anti Nuclear Antibody (ANA): NEGATIVE

## 2021-09-01 LAB — IGG: IgG (Immunoglobin G), Serum: 1320 mg/dL (ref 586–1602)

## 2021-09-01 LAB — MONONUCLEOSIS SCREEN: Mono Screen: NEGATIVE

## 2021-09-01 LAB — CERULOPLASMIN: Ceruloplasmin: 47 mg/dL — ABNORMAL HIGH (ref 19.0–39.0)

## 2021-09-01 LAB — ANTI-SMOOTH MUSCLE ANTIBODY, IGG: F-Actin IgG: 7 Units (ref 0–19)

## 2021-09-01 MED ORDER — POTASSIUM CHLORIDE 10 MEQ/100ML IV SOLN
10.0000 meq | INTRAVENOUS | Status: AC
  Administered 2021-09-01 (×4): 10 meq via INTRAVENOUS
  Filled 2021-09-01 (×4): qty 100

## 2021-09-01 MED ORDER — ONDANSETRON HCL 4 MG/2ML IJ SOLN
4.0000 mg | Freq: Four times a day (QID) | INTRAMUSCULAR | Status: DC
  Administered 2021-09-01 – 2021-09-03 (×8): 4 mg via INTRAVENOUS
  Filled 2021-09-01 (×8): qty 2

## 2021-09-01 NOTE — Progress Notes (Signed)
? ? ? ?   Progress Note ? ? Subjective  ?Was doing better yesterday but had multiple episodes of vomiting overnight. States she ate dumplings which was too strong for her. Tolerating light diet today, having some fruit. Feeling better this AM. LAEs remain elevated. ? ? Objective  ? ?Vital signs in last 24 hours: ?Temp:  [98 ?F (36.7 ?C)-98.3 ?F (36.8 ?C)] 98.2 ?F (36.8 ?C) (05/02 1610) ?Pulse Rate:  [62-82] 62 (05/02 0724) ?Resp:  [16-19] 16 (05/02 0724) ?BP: (142-157)/(80-98) 149/80 (05/02 0724) ?SpO2:  [98 %-100 %] 99 % (05/02 0724) ?Weight:  [92.6 kg] 92.6 kg (05/02 0430) ?Last BM Date : 09/28/21 ?General:    AA female in NAD ?Neurologic:  Alert and oriented,  grossly normal neurologically. ?Psych:  Cooperative. Normal mood and affect. ? ?Intake/Output from previous day: ?05/01 0701 - 05/02 0700 ?In: 4159.5 [P.O.:460; I.V.:3499.5; IV Piggyback:200] ?Out: 4275 [Urine:3450; Emesis/NG output:825] ?Intake/Output this shift: ?Total I/O ?In: -  ?Out: 550 [Urine:550] ? ?Lab Results: ?No results for input(s): WBC, HGB, HCT, PLT in the last 72 hours. ?BMET ?Recent Labs  ?  08/30/21 ?0503 08/31/21 ?9604 09/01/21 ?0459  ?NA 133* 131* 131*  ?K 3.6 4.5 3.4*  ?CL 104 104 101  ?CO2 23 22 23   ?GLUCOSE 116* 121* 101*  ?BUN <5* 6 8  ?CREATININE 0.78 0.81 0.77  ?CALCIUM 8.7* 8.7* 8.7*  ? ?LFT ?Recent Labs  ?  09/01/21 ?0459  ?PROT 6.0*  ?ALBUMIN 2.6*  ?AST 179*  ?ALT 534*  ?ALKPHOS 48  ?BILITOT 0.1*  ? ?PT/INR ?Recent Labs  ?  08/31/21 ?0431  ?LABPROT 14.5  ?INR 1.1  ? ? ?Studies/Results: ?No results found. ? ? ? ? Assessment / Plan:   ? ?31 y/o female pregnant [redacted] weeks, admitted with hyperemesis gravidarum after failing outpatient regimen. Found to have newly elevated liver enzymes with ALT initially in 700s and AST in 300s. They are improved from admission but remain elevated, ALT in 500s. AP and bili are normal. She has patent vasculature on 26. Gallstones noted but biliary tree nondilated. She is not in any pain. No new meds she  was on to cause this, denies supplement use. Do not think gallstones are related to this current presentation. ?  ?Transaminitis is likely related to hyperemesis. While her enzymes are a bit more elevated than typically seen with HG, the ALT can go over 1000 in severe cases. Once her vomiting stops, this should start to improve. Other hepatic disorders of specific to pregnancy usually occur much later in the course of pregnancy. She has no evidence of hemolysis or thrombocytopenia. She tested negative for acute viral hepatitis but will checking hep E which is pending, and labs to rule out autoimmune hepatitis. HSV seems unlikely but will also test for that.  ? ?In regards to her regimen, she thinks Zofran has worked okay for her in the past. Would put her on scheduled Zofran to see if we can break cycle of vomiting while in house, and continue steroids otherwise. She agrees ? ?Plan: ?- continue regimen for hyperemesis ?- will add standing Zofran every 6 hours ?- trend LFTs, repeat in the AM ?- awaiting pending serologies to rule out other causes, but suspect as above this is related to HG ? ?Call with questions. ? ?[place], MD ?[facility] ? ?

## 2021-09-01 NOTE — Progress Notes (Signed)
Patient ID: [name], female   DOB: [birth year], 31 y.o.   MRN: [id number] ?Daily Antepartum Note  ?Admission Date: 08/27/2021 ?Current Date: 09/01/2021 ?7:38 AM ? ?Becky Torres is a 31 y.o. [id number] @ [redacted]w[redacted]d, admitted for n/v of pregnancy. ? ?Pregnancy complicated by: ?Patient Active Problem List  ? Diagnosis Date Noted  ? Cholelithiasis affecting pregnancy in first trimester, antepartum 08/30/2021  ? Gallbladder sludge 08/30/2021  ? Patient has son with Sickle Cell Disease 08/29/2021  ? UTI in pregnancy 08/29/2021  ? Elevated amylase 08/29/2021  ? Transaminitis 08/29/2021  ? AKI (acute kidney injury) ([place]) 08/29/2021  ? Hyperemesis 08/28/2021  ? Nausea and vomiting during pregnancy 08/26/2021  ? Excessive salivation while pregnant 08/26/2021  ? Sickle cell trait ([place])   ? Supervision of high risk pregnancy, antepartum 08/11/2021  ? Chronic hypertension complicating or reason for care during pregnancy, first trimester 07/24/2012  ? ? ?Overnight/24hr events:  ?Patient with episodes of emesis overnight. ? ?Subjective:  ?Patient reports persistent nausea and emesis but overall feels better. She is able to tolerate liquids but was not able to tolerate chicken noodle soup or a salad brought by a family member. She denies cramping or vaginal bleeding. She is ambulating in the room ? ?Objective:  ? ? Current Vital Signs 24h Vital Sign Ranges  ?T 98.2 ?F (36.8 ?C) Temp  Avg: 98.2 ?F (36.8 ?C)  Min: 98 ?F (36.7 ?C)  Max: 98.5 ?F (36.9 ?C)  ?BP (!) 149/80 BP  Min: 142/89  Max: 157/94  ?HR 62 Pulse  Avg: 72.9  Min: 62  Max: 83  ?RR 16 Resp  Avg: 17.4  Min: 16  Max: 19  ?SaO2 99 %  (Room Air) SpO2  Avg: 98.5 %  Min: 97 %  Max: 100 %  ?    ? 24 Hour I/O Current Shift I/O  ?Time ?Ins ?Outs 05/01 0701 - 05/02 0700 ?In: 4159.5 [P.O.:460; I.V.:3499.5] ?Out: [id number] [Urine:3450] No intake/output data recorded.  ? ?FHT: 150s ? ?Physical exam: ?GENERAL: Well-developed, well-nourished female in no acute distress.  ?LUNGS: Clear to  auscultation bilaterally.  ?HEART: Regular rate and rhythm. ?ABDOMEN: Soft, nontender, nondistended. No organomegaly. ?PELVIC: Not indicated ?EXTREMITIES: No cyanosis, clubbing, or edema, 2+ distal pulses. ? ? ?Medications: ?Current Facility-Administered Medications  ?Medication Dose Route Frequency Provider Last Rate Last Admin  ? cefTRIAXone (ROCEPHIN) 2 g in sodium chloride 0.9 % 100 mL IVPB  2 g Intravenous Q24H Anyanwu, Ugonna A, MD 200 mL/hr at 08/31/21 1109 2 g at 08/31/21 1109  ? dextrose 5 %-0.45 % sodium chloride infusion   Intravenous Continuous Anyanwu, Sallyanne Havers, MD 100 mL/hr at 08/31/21 2331 New Bag at 08/31/21 2331  ? enoxaparin (LOVENOX) injection 40 mg  40 mg Subcutaneous Q24H Aletha Halim, MD   40 mg at 08/31/21 0917  ? famotidine (PEPCID) tablet 20 mg  20 mg Oral Q12H Chancy Milroy, MD   20 mg at 08/31/21 0919  ? Or  ? famotidine (PEPCID) IVPB 20 mg premix  20 mg Intravenous Q12H Chancy Milroy, MD 100 mL/hr at 08/31/21 2243 20 mg at 08/31/21 2243  ? glycopyrrolate (ROBINUL) tablet 1 mg  1 mg Oral BID Chancy Milroy, MD   1 mg at 08/31/21 2235  ? methylPREDNISolone (MEDROL) tablet 16 mg  16 mg Oral Q breakfast Chancy Milroy, MD   16 mg at 08/31/21 [id number]  ? Followed by  ? [START ON 09/02/2021] methylPREDNISolone (MEDROL) tablet 8 mg  8  mg Oral Q breakfast Chancy Milroy, MD      ? Followed by  ? Derrill Memo ON 09/09/2021] methylPREDNISolone (MEDROL) tablet 4 mg  4 mg Oral Q breakfast Chancy Milroy, MD      ? methylPREDNISolone (MEDROL) tablet 8 mg  8 mg Oral Q1400 Chancy Milroy, MD   8 mg at 08/31/21 1521  ? Followed by  ? [START ON 09/03/2021] methylPREDNISolone (MEDROL) tablet 4 mg  4 mg Oral Q1400 Chancy Milroy, MD      ? methylPREDNISolone (MEDROL) tablet 8 mg  8 mg Oral QHS Chancy Milroy, MD      ? Followed by  ? Derrill Memo ON 09/04/2021] methylPREDNISolone (MEDROL) tablet 4 mg  4 mg Oral QHS Chancy Milroy, MD      ? polyethylene glycol (MIRALAX / GLYCOLAX) packet 17 g  17 g Oral  Daily PRN Aletha Halim, MD      ? potassium chloride SA (KLOR-CON M) CR tablet 20 mEq  20 mEq Oral BID Aletha Halim, MD      ? prenatal multivitamin tablet 1 tablet  1 tablet Oral Q1200 Chancy Milroy, MD      ? promethazine (PHENERGAN) 25 mg in sodium chloride 0.9 % 50 mL IVPB  25 mg Intravenous Q6H PRN Raymona Boss, MD 200 mL/hr at 09/01/21 0040 25 mg at 09/01/21 0040  ? scopolamine (TRANSDERM-SCOP) 1 MG/3DAYS 1.5 mg  1 patch Transdermal Q72H Chancy Milroy, MD   1.5 mg at 08/30/21 1022  ? vitamin B-6 (pyridOXINE) tablet 25 mg  25 mg Oral BID Aletha Halim, MD   25 mg at 08/31/21 2236  ? zolpidem (AMBIEN) tablet 5 mg  5 mg Oral QHS PRN Chancy Milroy, MD      ? ? ?Labs:  ?Recent Labs  ?Lab 08/30/21 ?0503 08/31/21 ?[id number] 09/01/21 ?0459  ?NA 133* 131* 131*  ?K 3.6 4.5 3.4*  ?CL 104 104 101  ?CO2 23 22 23   ?BUN <5* 6 8  ?CREATININE 0.78 0.81 0.77  ?CALCIUM 8.7* 8.7* 8.7*  ?PROT 6.3* 6.3* 6.0*  ?BILITOT 0.6 0.4 0.1*  ?ALKPHOS 50 52 48  ?ALT 507* 535* 534*  ?AST 191* 196* 179*  ?GLUCOSE 116* 121* 101*  ?Amylase: 165>114>150>267 ? ?Urine Cx:  Pansensitive E.colic ([id number] colonies) ? ?Radiology:  ?US Abdomen Limited RUQ (LIVER/GB) ? ?Result Date: 08/30/2021 ?CLINICAL DATA:  Abnormal LFTs EXAM: ULTRASOUND ABDOMEN LIMITED RIGHT UPPER QUADRANT COMPARISON:  None. FINDINGS: Gallbladder: Sludge is present. An 8 mm stone is noted. Mild wall thickening measuring 4 mm. No pericholecystic fluid. No sonographic Murphy sign noted by sonographer. Common bile duct: Diameter: Within normal limits Liver: No focal lesion identified. Within normal limits in parenchymal echogenicity. Portal vein is patent on color Doppler imaging with normal direction of blood flow towards the liver. Other: None. IMPRESSION: Gallbladder sludge and cholelithiasis with mild wall thickening. No additional evidence of acute cholecystitis. Electronically Signed   By: Macy Mis M.D.   On: 08/30/2021 10:12   ? ? ?Assessment & Plan:  ?Patient  slowly improving ?*Pregnancy: fetal status reassuring ?*N/V: continue current medication regimen. Ruq u/s showed gallballder sludge and 8 mm stone, no acute cholecystitis. Gen Surg feels no need for urgent surgical intervention. Continue to monitor LFTs.   ?*PPx: lovenox ?*FEN/GI: Continue low fat diet ?*Renal: AKI resolved.  ?*ID: Started Ceftriaxone for UTI for now, will transition to [facility] ?*cHTN: no issues on no meds. Some mild range BPs but pt is on  steroids. Hold off on treatment for now ?*Dispo: Continue inpatient observation ? ?Mora Bellman, MD ?Attending ?[facility] [employer] farm manager) ?GYN Consult Phone: [phone] (M-F, [phone]) & [phone]  (Off hours, weekends, holidays) ? ?

## 2021-09-02 ENCOUNTER — Telehealth: Payer: Self-pay

## 2021-09-02 DIAGNOSIS — R1110 Vomiting, unspecified: Secondary | ICD-10-CM | POA: Diagnosis not present

## 2021-09-02 DIAGNOSIS — Z3A11 11 weeks gestation of pregnancy: Secondary | ICD-10-CM | POA: Diagnosis not present

## 2021-09-02 DIAGNOSIS — R7401 Elevation of levels of liver transaminase levels: Secondary | ICD-10-CM | POA: Diagnosis not present

## 2021-09-02 DIAGNOSIS — R7989 Other specified abnormal findings of blood chemistry: Secondary | ICD-10-CM | POA: Diagnosis not present

## 2021-09-02 LAB — CBC WITH DIFFERENTIAL/PLATELET
Abs Immature Granulocytes: 0.04 10*3/uL (ref 0.00–0.07)
Basophils Absolute: 0 10*3/uL (ref 0.0–0.1)
Basophils Relative: 0 %
Eosinophils Absolute: 0 10*3/uL (ref 0.0–0.5)
Eosinophils Relative: 0 %
HCT: 31.8 % — ABNORMAL LOW (ref 36.0–46.0)
Hemoglobin: 10.6 g/dL — ABNORMAL LOW (ref 12.0–15.0)
Immature Granulocytes: 0 %
Lymphocytes Relative: 16 %
Lymphs Abs: 1.5 10*3/uL (ref 0.7–4.0)
MCH: 24 pg — ABNORMAL LOW (ref 26.0–34.0)
MCHC: 33.3 g/dL (ref 30.0–36.0)
MCV: 72.1 fL — ABNORMAL LOW (ref 80.0–100.0)
Monocytes Absolute: 0.8 10*3/uL (ref 0.1–1.0)
Monocytes Relative: 9 %
Neutro Abs: 7.4 10*3/uL (ref 1.7–7.7)
Neutrophils Relative %: 75 %
Platelets: 200 10*3/uL (ref 150–400)
RBC: 4.41 MIL/uL (ref 3.87–5.11)
RDW: 19 % — ABNORMAL HIGH (ref 11.5–15.5)
WBC: 9.9 10*3/uL (ref 4.0–10.5)
nRBC: 0 % (ref 0.0–0.2)

## 2021-09-02 LAB — COMPREHENSIVE METABOLIC PANEL
ALT: 523 U/L — ABNORMAL HIGH (ref 0–44)
AST: 128 U/L — ABNORMAL HIGH (ref 15–41)
Albumin: 2.7 g/dL — ABNORMAL LOW (ref 3.5–5.0)
Alkaline Phosphatase: 48 U/L (ref 38–126)
Anion gap: 10 (ref 5–15)
BUN: 5 mg/dL — ABNORMAL LOW (ref 6–20)
CO2: 23 mmol/L (ref 22–32)
Calcium: 8.9 mg/dL (ref 8.9–10.3)
Chloride: 100 mmol/L (ref 98–111)
Creatinine, Ser: 0.86 mg/dL (ref 0.44–1.00)
GFR, Estimated: >60 mL/min (ref >60)
Glucose, Bld: 97 mg/dL (ref 70–99)
Potassium: 3.7 mmol/L (ref 3.5–5.1)
Sodium: 133 mmol/L — ABNORMAL LOW (ref 135–145)
Total Bilirubin: 0.3 mg/dL (ref 0.3–1.2)
Total Protein: 6.3 g/dL — ABNORMAL LOW (ref 6.5–8.1)

## 2021-09-02 LAB — AMYLASE: Amylase: 126 U/L — ABNORMAL HIGH (ref 28–100)

## 2021-09-02 LAB — T4, FREE: Free T4: 1.5 ng/dL — ABNORMAL HIGH (ref 0.61–1.12)

## 2021-09-02 MED ORDER — CEFADROXIL 500 MG PO CAPS
500.0000 mg | ORAL_CAPSULE | Freq: Two times a day (BID) | ORAL | Status: DC
Start: 2021-09-02 — End: 2021-09-03
  Administered 2021-09-02 – 2021-09-03 (×3): 500 mg via ORAL
  Filled 2021-09-02 (×4): qty 1

## 2021-09-02 NOTE — Telephone Encounter (Signed)
Called pt. Still admitted. Plan for discharge is tomorrow AM. States nausea is improving, she has been able to eat bland foods. States her office has sent FMLA paperwork to her to be filled out. Fax number given. Explained there is a $15 fee for completion. Paperwork should be faxed or brought in person. Will take 10-14 days to complete. Encouraged pt to contact office if nausea worsens after discharge. Reviewed follow up appt 09/16/21 with our office. ?

## 2021-09-02 NOTE — Progress Notes (Signed)
Patient ID: Becky Torres, female   DOB: 01-30-1991, 31 y.o.   MRN: [id number] ?Daily Antepartum Note  ?Admission Date: 08/27/2021 ?Current Date: 09/02/2021 ?12:56 PM ? ?Becky Torres is Torres 31 y.o. [id number] @ 11w12, admitted for n/v of pregnancy. ? ?Pregnancy complicated by: ?Patient Active Problem List  ? Diagnosis Date Noted  ? Elevated LFTs   ? Cholelithiasis affecting pregnancy in first trimester, antepartum 08/30/2021  ? Gallbladder sludge 08/30/2021  ? Patient has son with Sickle Cell Disease 08/29/2021  ? UTI in pregnancy 08/29/2021  ? Elevated amylase 08/29/2021  ? Transaminitis 08/29/2021  ? AKI (acute kidney injury) ([place]) 08/29/2021  ? Hyperemesis 08/28/2021  ? Nausea and vomiting during pregnancy 08/26/2021  ? Excessive salivation while pregnant 08/26/2021  ? Sickle cell trait ([place])   ? Supervision of high risk pregnancy, antepartum 08/11/2021  ? Chronic hypertension complicating or reason for care during pregnancy, first trimester 07/24/2012  ? ? ?Overnight/24hr events:  ?No episodes of emesis overnight. ? ?Subjective:  ?Patient is able to tolerate food! She denies cramping or vaginal bleeding. She is ambulating in the room.  Feels significantly better ? ?Objective:  ? ? Current Vital Signs 24h Vital Sign Ranges  ?T 98.4 ?F (36.9 ?C) Temp  Avg: 98.1 ?F (36.7 ?C)  Min: 97.6 ?F (36.4 ?C)  Max: 98.4 ?F (36.9 ?C)  ?BP 119/73 BP  Min: 117/78  Max: 155/90  ?HR 84 Pulse  Avg: 73.2  Min: 63  Max: 84  ?RR 19 Resp  Avg: 17.2  Min: 16  Max: 19  ?SaO2 100 % Room Air SpO2  Avg: 98 %  Min: 96 %  Max: 100 %  ?    ? 24 Hour I/O Current Shift I/O  ?Time ?Ins ?Outs 05/02 0701 - 05/03 0700 ?In: 2698.3 [P.O.:523; I.V.:2125.3] ?Out: 4400 [Urine:4400] 05/03 0701 - 05/03 1900 ?In: 919.9 [P.O.:720; I.V.:199.9] ?Out: 500 [Urine:500]  ? ?FHT: 150s ? ?Physical exam: ?GENERAL: Well-developed, well-nourished female in no acute distress.  ?LUNGS: Clear to auscultation bilaterally.  ?HEART: Regular rate and rhythm. ?ABDOMEN: Soft,  nontender, nondistended. No organomegaly. ?PELVIC: Not indicated ?EXTREMITIES: No cyanosis, clubbing, or edema, 2+ distal pulses. ? ? ?Medications: ?Current Facility-Administered Medications  ?Medication Dose Route Frequency Provider Last Rate Last Admin  ? cefTRIAXone (ROCEPHIN) 2 g in sodium chloride 0.9 % 100 mL IVPB  2 g Intravenous Q24H Becky Torres A, MD 200 mL/hr at 09/02/21 1115 2 g at 09/02/21 1115  ? dextrose 5 %-0.45 % sodium chloride infusion   Intravenous Continuous Becky Wisnewski A, MD 100 mL/hr at 09/02/21 0506 New Bag at 09/02/21 0506  ? enoxaparin (LOVENOX) injection 40 mg  40 mg Subcutaneous Q24H Becky Halim, MD   40 mg at 09/02/21 1011  ? famotidine (PEPCID) tablet 20 mg  20 mg Oral Q12H Becky Milroy, MD   20 mg at 09/02/21 1010  ? Or  ? famotidine (PEPCID) IVPB 20 mg premix  20 mg Intravenous Q12H Becky Milroy, MD 100 mL/hr at 08/31/21 2243 20 mg at 08/31/21 2243  ? glycopyrrolate (ROBINUL) tablet 1 mg  1 mg Oral BID Becky Milroy, MD   1 mg at 09/02/21 1010  ? methylPREDNISolone (MEDROL) tablet 8 mg  8 mg Oral Q breakfast Becky Milroy, MD   8 mg at 09/02/21 0805  ? Followed by  ? [START ON 09/09/2021] methylPREDNISolone (MEDROL) tablet 4 mg  4 mg Oral Q breakfast Becky Milroy, MD      ?  methylPREDNISolone (MEDROL) tablet 8 mg  8 mg Oral Q1400 Becky Milroy, MD   8 mg at 09/01/21 1414  ? Followed by  ? [START ON 09/03/2021] methylPREDNISolone (MEDROL) tablet 4 mg  4 mg Oral Q1400 Becky Milroy, MD      ? methylPREDNISolone (MEDROL) tablet 8 mg  8 mg Oral QHS Becky Milroy, MD   8 mg at 09/01/21 2246  ? Followed by  ? [START ON 09/04/2021] methylPREDNISolone (MEDROL) tablet 4 mg  4 mg Oral QHS Becky Milroy, MD      ? ondansetron [facility]) injection 4 mg  4 mg Intravenous Q6H Becky Torres, Becky Raspberry, MD   4 mg at 09/02/21 1121  ? polyethylene glycol (MIRALAX / GLYCOLAX) packet 17 g  17 g Oral Daily PRN Becky Halim, MD      ? prenatal multivitamin tablet 1 tablet   1 tablet Oral Q1200 Becky Milroy, MD   1 tablet at 09/02/21 1121  ? promethazine (PHENERGAN) 25 mg in sodium chloride 0.9 % 50 mL IVPB  25 mg Intravenous Q6H PRN Torres, Peggy, MD 200 mL/hr at 09/01/21 2040 25 mg at 09/01/21 2040  ? scopolamine (TRANSDERM-SCOP) 1 MG/3DAYS 1.5 mg  1 patch Transdermal Q72H Becky Milroy, MD   1.5 mg at 09/02/21 1011  ? vitamin B-6 (pyridOXINE) tablet 25 mg  25 mg Oral BID Becky Halim, MD   25 mg at 09/02/21 1010  ? zolpidem (AMBIEN) tablet 5 mg  5 mg Oral QHS PRN Becky Milroy, MD      ? ? ?Labs:  ?Recent Labs  ?Lab 08/31/21 ?0431 09/01/21 ?0459 09/02/21 ?0411  ?NA 131* 131* 133*  ?K 4.5 3.4* 3.7  ?CL 104 101 100  ?CO2 22 23 23   ?BUN 6 8 5*  ?CREATININE 0.81 0.77 0.86  ?CALCIUM 8.7* 8.7* 8.9  ?PROT 6.3* 6.0* 6.3*  ?BILITOT 0.4 0.1* 0.3  ?ALKPHOS 52 48 48  ?ALT 535* 534* 523*  ?AST 196* 179* 128*  ?GLUCOSE 121* 101* 97  ? ?Amylase: 165>114>150>267>126 ? ?Urine Cx:  Pansensitive E.colic ([id number] colonies) ? ? ?Radiology:  ?No results found. ? ? ?Assessment & Plan:  ?Patient slowly improving ?*Pregnancy: fetal status reassuring ?*N/V and GI: continue current medication regimen. Ruq u/s showed gallballder sludge and 8 mm stone, no acute cholecystitis. Gen Surg feels no need for urgent surgical intervention. LFTs are trending down.  GI feels one more day of observation is recommended, will follow up am labs. Continue plain, low fat diet ?*PPx: lovenox ?*Renal: AKI resolved.  ?*ID: Started Ceftriaxone for UTI for now, will transition today to [facility] ?*cHTN: no issues on no meds. Some mild range BPs but pt is on steroids. Hold off on treatment for now ?*Dispo: Continue inpatient observation ? ?Becky Schneiders, MD ?Attending ?[facility] for [employer] farm manager) ?GYN Consult Phone: [phone] (M-F, [phone]) & [phone]  (Off hours, weekends, holidays) ? ?

## 2021-09-02 NOTE — Telephone Encounter (Signed)
-----   Message from Isabell Jarvis, RN sent at 08/31/2021  4:04 PM EDT ----- ?Regarding: FW: telephone nurse visit 5/2 or 5/3 ?This pt is currently admitted, please call pt on Wed 5/3 to follow up with pt.  ? ?----- Message ----- ?From: Beverely Low A ?Sent: 08/31/2021   9:30 AM EDT ?To: [facility] Clinical Pool ?Subject: FW: telephone nurse visit 5/2 or 5/3          ? ?Can someone please call this patient instead of making it a whole visit  ?----- Message ----- ?From: [place] Bing, MD ?Sent: 08/30/2021   9:50 AM EDT ?To: [facility] Clinical Pool, [facility] Admin Pool ?Subject: telephone nurse visit 5/2 or 5/3              ? ?To check up on her given recent hospitalization for n/v of pregnancy. thanks ? ? ? ?

## 2021-09-02 NOTE — Progress Notes (Signed)
? ? ? ?   Progress Note ? ? Subjective  ?Patient states she had a good day and night. Zofran standing order has helped, no further vomiting overnight. She thinks she is improving. Wants to eat breakfast. She denies any pain. ? ? Objective  ? ?Vital signs in last 24 hours: ?Temp:  [97.6 ?F (36.4 ?C)-98.3 ?F (36.8 ?C)] 98.1 ?F (36.7 ?C) (05/03 0510) ?Pulse Rate:  [63-85] 69 (05/03 0510) ?Resp:  [15-17] 16 (05/03 0510) ?BP: (117-155)/(78-92) 117/78 (05/03 0510) ?SpO2:  [96 %-99 %] 96 % (05/03 0510) ?Weight:  [91.8 kg] 91.8 kg (05/03 0548) ?Last BM Date :  (per patient, 2 weeks ago) ?General:    AA female in NAD ?Neurologic:  Alert and oriented,  grossly normal neurologically. ?Psych:  Cooperative. Normal mood and affect. ? ?Intake/Output from previous day: ?05/02 0701 - 05/03 0700 ?In: 2698.3 [P.O.:523; I.V.:2125.3; IV Piggyback:50] ?Out: 4400 [Urine:4400] ?Intake/Output this shift: ?No intake/output data recorded. ? ?Lab Results: ?Recent Labs  ?  09/02/21 ?0411  ?WBC 9.9  ?HGB 10.6*  ?HCT 31.8*  ?PLT 200  ? ?BMET ?Recent Labs  ?  08/31/21 ?0431 09/01/21 ?0459 09/02/21 ?0411  ?NA 131* 131* 133*  ?K 4.5 3.4* 3.7  ?CL 104 101 100  ?CO2 22 23 23   ?GLUCOSE 121* 101* 97  ?BUN 6 8 5*  ?CREATININE 0.81 0.77 0.86  ?CALCIUM 8.7* 8.7* 8.9  ? ?LFT ?Recent Labs  ?  09/02/21 ?0411  ?PROT 6.3*  ?ALBUMIN 2.7*  ?AST 128*  ?ALT 523*  ?ALKPHOS 48  ?BILITOT 0.3  ? ?PT/INR ?Recent Labs  ?  08/31/21 ?0431  ?LABPROT 14.5  ?INR 1.1  ? ? ?Studies/Results: ?No results found. ? ? ? ? Assessment / Plan:   ? ?31 y/o female pregnant [redacted] weeks, admitted with hyperemesis gravidarum after failing outpatient regimen. Newly elevated liver enzymes noted with ALT initially in 700s and AST in 300s. AP and bili normal. They are improved from admission but remain elevated. AST with continued downtrend, ALT stable and slow downtrend. She has patent vasculature on 26. Gallstones noted but biliary tree nondilated. She is not in any pain. No new meds she was on  to cause this, denies supplement use. Do not think gallstones are related to this current presentation. ?  ?We have discussed that the transaminitis is likely related to HG, but her enzymes are a bit higher than would typically see with this, certainly possible however as enzymes can rarely go into 1000s with HG. Her vomiting has improved and hopefully with that her enzymes get better. She has had some elevated BPs but other hepatic disorders of specific to pregnancy usually occur much later in the course of pregnancy. She has no evidence of hemolysis or thrombocytopenia. She tested negative for acute viral hepatitis, hep E and HSV pending, monospot negative, autoimmune hepatitis negative. INR normal which is reassuring. ? ?Overall she is feeling better with standing Zofran added to steroids and would continue that. I would like to continue that regimen today and repeat her LFTs and INR in the AM. If she has a good downtrending maybe home tomorrow if she is otherwise feeling well. I have discussed her case with a hepatologist who agreed with assessment and plan.  ? ?Plan: ?- continue regimen for hyperemesis - standing Zofran and steroids ?- await pending serologies ?- trend LFTs and INR, repeat in the AM ? ?[place], MD ?[facility] ? ?

## 2021-09-03 ENCOUNTER — Encounter: Payer: Self-pay | Admitting: Obstetrics & Gynecology

## 2021-09-03 ENCOUNTER — Encounter (HOSPITAL_COMMUNITY): Payer: Self-pay | Admitting: Obstetrics and Gynecology

## 2021-09-03 ENCOUNTER — Telehealth: Payer: Self-pay

## 2021-09-03 DIAGNOSIS — R7401 Elevation of levels of liver transaminase levels: Secondary | ICD-10-CM | POA: Diagnosis not present

## 2021-09-03 DIAGNOSIS — R1110 Vomiting, unspecified: Secondary | ICD-10-CM | POA: Diagnosis not present

## 2021-09-03 DIAGNOSIS — R7989 Other specified abnormal findings of blood chemistry: Secondary | ICD-10-CM

## 2021-09-03 DIAGNOSIS — Z3A11 11 weeks gestation of pregnancy: Secondary | ICD-10-CM | POA: Diagnosis not present

## 2021-09-03 LAB — COMPREHENSIVE METABOLIC PANEL
ALT: 439 U/L — ABNORMAL HIGH (ref 0–44)
AST: 77 U/L — ABNORMAL HIGH (ref 15–41)
Albumin: 2.7 g/dL — ABNORMAL LOW (ref 3.5–5.0)
Alkaline Phosphatase: 53 U/L (ref 38–126)
Anion gap: 9 (ref 5–15)
BUN: 5 mg/dL — ABNORMAL LOW (ref 6–20)
CO2: 22 mmol/L (ref 22–32)
Calcium: 9.1 mg/dL (ref 8.9–10.3)
Chloride: 101 mmol/L (ref 98–111)
Creatinine, Ser: 0.79 mg/dL (ref 0.44–1.00)
GFR, Estimated: >60 mL/min (ref >60)
Glucose, Bld: 107 mg/dL — ABNORMAL HIGH (ref 70–99)
Potassium: 4 mmol/L (ref 3.5–5.1)
Sodium: 132 mmol/L — ABNORMAL LOW (ref 135–145)
Total Bilirubin: 0.3 mg/dL (ref 0.3–1.2)
Total Protein: 6.3 g/dL — ABNORMAL LOW (ref 6.5–8.1)

## 2021-09-03 LAB — T3, FREE: T3, Free: 3 pg/mL (ref 2.0–4.4)

## 2021-09-03 LAB — PROTIME-INR
INR: 1.1 (ref 0.8–1.2)
Prothrombin Time: 13.7 seconds (ref 11.4–15.2)

## 2021-09-03 LAB — GLUCOSE, CAPILLARY: Glucose-Capillary: 114 mg/dL — ABNORMAL HIGH (ref 70–99)

## 2021-09-03 MED ORDER — FAMOTIDINE 20 MG PO TABS: 20.0000 mg | ORAL_TABLET | Freq: Two times a day (BID) | ORAL | 2 refills | Status: DC

## 2021-09-03 MED ORDER — ONDANSETRON HCL 4 MG PO TABS: 4.0000 mg | ORAL_TABLET | Freq: Four times a day (QID) | ORAL | 2 refills | Status: DC | PRN

## 2021-09-03 MED ORDER — CEFADROXIL 500 MG PO CAPS: 500.0000 mg | ORAL_CAPSULE | Freq: Two times a day (BID) | ORAL | 0 refills | Status: AC

## 2021-09-03 MED ORDER — METHYLPREDNISOLONE 4 MG PO TABS: ORAL_TABLET | ORAL | 1 refills | Status: DC

## 2021-09-03 MED ORDER — PROMETHAZINE HCL 25 MG RE SUPP: 25.0000 mg | Freq: Four times a day (QID) | RECTAL | 2 refills | Status: DC | PRN

## 2021-09-03 MED ORDER — GLYCOPYRROLATE 2 MG PO TABS: 2.0000 mg | ORAL_TABLET | Freq: Three times a day (TID) | ORAL | 3 refills | Status: DC

## 2021-09-03 NOTE — Telephone Encounter (Signed)
Called and spoke with patient regarding Jennifer's recommendations. Pt has been scheduled for a hospital follow up with Dr. Russella Dar on Thursday, 10/08/21 at 9:30 am. Pt knows to stop by our office on Monday for repeat labs. She is aware that no appt is necessary. Pt verbalized understanding of all information and had no concerns at the end of the call. ? ?Lab order in epic. Lab reminder sent to Dr. Ardell Isaacs nurse, Sharyon Medicus, RN.  ?

## 2021-09-03 NOTE — Plan of Care (Signed)
?  Problem: Education: ?Goal: Knowledge of disease or condition will improve ?Outcome: Adequate for Discharge ?Goal: Knowledge of the prescribed therapeutic regimen will improve ?Outcome: Adequate for Discharge ?Goal: Individualized Educational Video(s) ?Outcome: Adequate for Discharge ?  ?Problem: Clinical Measurements: ?Goal: Complications related to the disease process, condition or treatment will be avoided or minimized ?Outcome: Adequate for Discharge ?  ?Problem: Health Behavior/Discharge Planning: ?Goal: Ability to manage health-related needs will improve ?Outcome: Adequate for Discharge ?  ?Problem: Clinical Measurements: ?Goal: Ability to maintain clinical measurements within normal limits will improve ?Outcome: Adequate for Discharge ?Goal: Will remain free from infection ?Outcome: Adequate for Discharge ?Goal: Diagnostic test results will improve ?Outcome: Adequate for Discharge ?Goal: Cardiovascular complication will be avoided ?Outcome: Adequate for Discharge ?  ?Problem: Nutrition: ?Goal: Adequate nutrition will be maintained ?Outcome: Adequate for Discharge ?  ?Problem: Coping: ?Goal: Level of anxiety will decrease ?Outcome: Adequate for Discharge ?  ?Problem: Elimination: ?Goal: Will not experience complications related to bowel motility ?Outcome: Adequate for Discharge ?  ?Problem: Skin Integrity: ?Goal: Risk for impaired skin integrity will decrease ?Outcome: Adequate for Discharge ?  ?

## 2021-09-03 NOTE — Discharge Summary (Signed)
Antenatal Physician Discharge Summary  ?Patient ID: ?Becky Torres ?MRN: [id number] ?DOB/AGE: 1990/05/13 31 y.o. ? ?Admit date: 08/27/2021 ?Discharge date: 09/03/2021 ? ?Admission Diagnoses:  ?Principal Problem: ?  Hyperemesis ?Active Problems: ?  Nausea and vomiting during pregnancy ?  Excessive salivation while pregnant ?  Patient has son with Sickle Cell Disease ?  UTI in pregnancy ?  Elevated amylase ?  Transaminitis ?  AKI (acute kidney injury) ([place]) ?  Cholelithiasis affecting pregnancy in first trimester, antepartum ?  Gallbladder sludge ?  Elevated LFTs ?  [redacted] weeks gestation of pregnancy ? ?Discharge Diagnoses:  The same ? ?Prenatal Procedures: ultrasound ? ?Consults: Gastroenterology ? ?Hospital Course:  ?Becky Torres is a 31 y.o. [phone] with IUP at [redacted]w[redacted]d admitted  on 08/27/2021 for hyperemesis, elevated LFTs and AKI.  Also found to have have gallstones and gallbaldder sludge. Gastroenterology was consulted, extensive evaluation was done which was neagtive and the elevated LFTs was thought to be due to the hyperemesis. Outpatient follow up was arranged with GI.  As for her hyperemesis, she was given several antiemetics and was started on a Medrol taper. Her symptoms slowly improved and she was able to tolerated food and drink.  She had no symptoms of cholecystitis, General Surgery did not feel any intervention was needed when they were asked about the gall bladder findings. She also had elevated amylase levels which decreased over time, but normal lipase and no evidence of pancreatitis.  She was noted noted to have transient mild hypothyroidism not requiring treatment, this is usually seen in association in hyperemesis and will be rechecked. Urine culture was also notable for  E.coli UTI, she was treated with Duricef.  She had no signs/symptoms of other maternal-fetal concerns.  She was deemed stable for discharge to home with outpatient follow up. ? ?Discharge Exam: ?Temp:  [97.8 ?F (36.6 ?C)-98.1 ?F (36.7  ?C)] 97.8 ?F (36.6 ?C) (05/04 1125) ?Pulse Rate:  [72-77] 75 (05/04 1125) ?Resp:  [17-18] 17 (05/04 1125) ?BP: (133-139)/(82-96) 136/86 (05/04 1125) ?SpO2:  [97 %-99 %] 97 % (05/04 1125) ?Weight:  [91.8 kg] 91.8 kg (05/04 0636) ?Fetal monitoring: FHR: 150 bpm ?Physical Examination: ?CONSTITUTIONAL: Well-developed, well-nourished female in no acute distress.  ?HENT:  Normocephalic, atraumatic, External right and left ear normal.  ?EYES: Conjunctivae and EOM are normal. Pupils are equal, round, and reactive to light. No scleral icterus.  ?NECK: Normal range of motion, supple, no masses ?SKIN: Skin is warm and dry. No rash noted. Not diaphoretic. No erythema. No pallor. ?NEUROLOGIC: Alert and oriented to person, place, and time. Normal reflexes, muscle tone coordination. No cranial nerve deficit noted. ?PSYCHIATRIC: Normal mood and affect. Normal behavior. Normal judgment and thought content. ?CARDIOVASCULAR: Normal heart rate noted, regular rhythm ?RESPIRATORY: Effort and breath sounds normal, no problems with respiration noted ?MUSCULOSKELETAL: Normal range of motion. No edema and no tenderness. 2+ distal pulses. ?ABDOMEN: Soft, nontender, nondistended, gravid. ?CERVIX:  Deferred ? ?Significant Diagnostic Studies:  ?Results for orders placed or performed during the hospital encounter of 08/27/21 (from the past 168 hour(s))  ?CBC  ? Collection Time: 08/27/21 11:45 PM  ?Result Value Ref Range  ? WBC 13.6 (H) 4.0 - 10.5 K/uL  ? RBC 5.84 (H) 3.87 - 5.11 MIL/uL  ? Hemoglobin 13.8 12.0 - 15.0 g/dL  ? HCT 42.2 36.0 - 46.0 %  ? MCV 72.3 (L) 80.0 - 100.0 fL  ? MCH 23.6 (L) 26.0 - 34.0 pg  ? MCHC 32.7 30.0 - 36.0 g/dL  ?  RDW 19.8 (H) 11.5 - 15.5 %  ? Platelets 314 150 - 400 K/uL  ? nRBC 0.0 0.0 - 0.2 %  ?Comprehensive metabolic panel  ? Collection Time: 08/27/21 11:45 PM  ?Result Value Ref Range  ? Sodium 133 (L) 135 - 145 mmol/L  ? Potassium 2.8 (L) 3.5 - 5.1 mmol/L  ? Chloride 95 (L) 98 - 111 mmol/L  ? CO2 23 22 - 32 mmol/L   ? Glucose, Bld 123 (H) 70 - 99 mg/dL  ? BUN 10 6 - 20 mg/dL  ? Creatinine, Ser 1.41 (H) 0.44 - 1.00 mg/dL  ? Calcium 9.6 8.9 - 10.3 mg/dL  ? Total Protein 8.7 (H) 6.5 - 8.1 g/dL  ? Albumin 3.7 3.5 - 5.0 g/dL  ? AST 322 (H) 15 - 41 U/L  ? ALT 736 (H) 0 - 44 U/L  ? Alkaline Phosphatase 70 38 - 126 U/L  ? Total Bilirubin 1.1 0.3 - 1.2 mg/dL  ? GFR, Estimated 51 (L) >60 mL/min  ? Anion gap 15 5 - 15  ?Type and screen [place]  ? Collection Time: 08/27/21 11:45 PM  ?Result Value Ref Range  ? ABO/RH(D) B POS   ? Antibody Screen NEG   ? Sample Expiration    ?  08/30/2021,2359 ?Performed at [facility], [place] [address] [place], [place] [address] ?  ?Amylase  ? Collection Time: 08/28/21  5:15 AM  ?Result Value Ref Range  ? Amylase 165 (H) 28 - 100 U/L  ?Lipase, blood  ? Collection Time: 08/28/21  5:15 AM  ?Result Value Ref Range  ? Lipase 42 11 - 51 U/L  ?Glucose, fasting - while on taper  ? Collection Time: 08/28/21  5:15 AM  ?Result Value Ref Range  ? Glucose, Bld 126 (H) 70 - 99 mg/dL  ?Glucose, capillary  ? Collection Time: 08/28/21  6:22 AM  ?Result Value Ref Range  ? Glucose-Capillary 131 (H) 70 - 99 mg/dL  ? Comment 1 Notify RN   ?Urinalysis, Routine w reflex microscopic Urine, Clean Catch  ? Collection Time: 08/28/21 10:14 AM  ?Result Value Ref Range  ? Color, Urine AMBER (A) YELLOW  ? APPearance HAZY (A) CLEAR  ? Specific Gravity, Urine 1.018 1.005 - 1.030  ? pH 5.0 5.0 - 8.0  ? Glucose, UA NEGATIVE NEGATIVE mg/dL  ? Hgb urine dipstick NEGATIVE NEGATIVE  ? Bilirubin Urine NEGATIVE NEGATIVE  ? Ketones, ur 20 (A) NEGATIVE mg/dL  ? Protein, ur 30 (A) NEGATIVE mg/dL  ? Nitrite NEGATIVE NEGATIVE  ? Leukocytes,Ua MODERATE (A) NEGATIVE  ? RBC / HPF 0-5 0 - 5 RBC/hpf  ? WBC, UA 21-50 0 - 5 WBC/hpf  ? Bacteria, UA RARE (A) NONE SEEN  ? Squamous Epithelial / LPF 6-10 0 - 5  ? Mucus PRESENT   ? Hyaline Casts, UA PRESENT   ? Ca Oxalate Crys, UA PRESENT   ?Glucose, fasting - while on taper  ?  Collection Time: 08/29/21  5:01 AM  ?Result Value Ref Range  ? Glucose, Bld 104 (H) 70 - 99 mg/dL  ?Amylase  ? Collection Time: 08/29/21  5:01 AM  ?Result Value Ref Range  ? Amylase 114 (H) 28 - 100 U/L  ?Comprehensive metabolic panel  ? Collection Time: 08/29/21  5:01 AM  ?Result Value Ref Range  ? Sodium 134 (L) 135 - 145 mmol/L  ? Potassium 2.2 (LL) 3.5 - 5.1 mmol/L  ? Chloride 101 98 - 111 mmol/L  ?  CO2 26 22 - 32 mmol/L  ? Glucose, Bld 102 (H) 70 - 99 mg/dL  ? BUN <5 (L) 6 - 20 mg/dL  ? Creatinine, Ser 0.94 0.44 - 1.00 mg/dL  ? Calcium 8.2 (L) 8.9 - 10.3 mg/dL  ? Total Protein 6.0 (L) 6.5 - 8.1 g/dL  ? Albumin 2.5 (L) 3.5 - 5.0 g/dL  ? AST 195 (H) 15 - 41 U/L  ? ALT 491 (H) 0 - 44 U/L  ? Alkaline Phosphatase 44 38 - 126 U/L  ? Total Bilirubin 0.6 0.3 - 1.2 mg/dL  ? GFR, Estimated >60 >60 mL/min  ? Anion gap 7 5 - 15  ?Magnesium  ? Collection Time: 08/29/21  5:01 AM  ?Result Value Ref Range  ? Magnesium 1.9 1.7 - 2.4 mg/dL  ?Hepatitis panel, acute  ? Collection Time: 08/29/21  5:30 AM  ?Result Value Ref Range  ? Hepatitis B Surface Ag NON REACTIVE NON REACTIVE  ? HCV Ab NON REACTIVE NON REACTIVE  ? Hep A IgM NON REACTIVE NON REACTIVE  ? Hep B C IgM NON REACTIVE NON REACTIVE  ?Culture, OB Urine  ? Collection Time: 08/29/21 12:34 PM  ? Specimen: Urine, Random  ?Result Value Ref Range  ? Specimen Description URINE, RANDOM   ? Special Requests NONE   ? Culture (A)   ?  >=100,000 COLONIES/mL ESCHERICHIA COLI ?NO GROUP B STREP (S.AGALACTIAE) ISOLATED ?Performed at [facility], [place] [address], [place], [place] [address] ?  ? Report Status 08/31/2021 FINAL   ? Organism ID, Bacteria ESCHERICHIA COLI (A)   ?    Susceptibility  ? Escherichia coli - MIC*  ?  AMPICILLIN >=32 RESISTANT Resistant   ?  CEFAZOLIN <=4 SENSITIVE Sensitive   ?  CEFEPIME <=0.12 SENSITIVE Sensitive   ?  CEFTRIAXONE <=0.25 SENSITIVE Sensitive   ?  CIPROFLOXACIN <=0.25 SENSITIVE Sensitive   ?  GENTAMICIN <=1 SENSITIVE Sensitive   ?  IMIPENEM 0.5  SENSITIVE Sensitive   ?  NITROFURANTOIN <=16 SENSITIVE Sensitive   ?  TRIMETH/SULFA <=20 SENSITIVE Sensitive   ?  AMPICILLIN/SULBACTAM 8 SENSITIVE Sensitive   ?  PIP/TAZO <=4 SENSITIVE Sensitive   ?  * >=10

## 2021-09-03 NOTE — Telephone Encounter (Signed)
-----   Message from Unk Lightning, [place] sent at 09/03/2021 10:59 AM EDT ----- ?Regarding: Follow-up ?This patient needs follow-up in clinic with Dr. Russella Dar at his next available, 4-6 weeks.  She also needs a repeat hepatic function panel ordered for Monday, 09/07/2021 under his name. ? ?Please also see my note from the hospitalization today.  Can you make a note to call [facility] on 5/11 or 5/12 to get results from hepatitis E testing faxed to [place].  The phone number is listed in my progress note as well as specimen number which will make it easier for you.  Thank you so much! ? ?JLL ? ?

## 2021-09-03 NOTE — Progress Notes (Signed)
? ? Progress Note ? ? Subjective  ?Chief Complaint: Hyperemesis gravidarum and elevated LFTs ? ?Patient continues to do well this morning as long as she is on scheduled Zofran.  She tells me in the evening she can tell when it is about to wear out but as soon as she gets her next dose she seems to do better.  No abdominal pain. ? ? Objective  ? ?Vital signs in last 24 hours: ?Temp:  [97.8 ?F (36.6 ?C)-98.1 ?F (36.7 ?C)] 98 ?F (36.7 ?C) (05/04 0753) ?Pulse Rate:  [72-84] 72 (05/04 0753) ?Resp:  [17-19] 18 (05/04 0753) ?BP: (119-139)/(73-96) 134/96 (05/04 0753) ?SpO2:  [98 %-100 %] 98 % (05/04 0753) ?Weight:  [91.8 kg] 91.8 kg (05/04 0636) ?Last BM Date :  (per patient, 2 weeks ago) ?General:  AA female in NAD ?Heart:  Regular rate and rhythm; no murmurs ?Lungs: Respirations even and unlabored, lungs CTA bilaterally ?Abdomen:  Soft, nontender and nondistended. Normal bowel sounds. ?Psych:  Cooperative. Normal mood and affect. ? ?Intake/Output from previous day: ?05/03 0701 - 05/04 0700 ?In: 2653.3 [P.O.:1320; I.V.:1133.3; IV Piggyback:200] ?Out: 3325 [Urine:3325] ?Intake/Output this shift: ?Total I/O ?In: 659.9 [P.O.:360; I.V.:299.9] ?Out: 400 [Urine:400] ? ?Lab Results: ?Recent Labs  ?  09/02/21 ?0411  ?WBC 9.9  ?HGB 10.6*  ?HCT 31.8*  ?PLT 200  ? ?BMET ?Recent Labs  ?  09/01/21 ?0459 09/02/21 ?0411 09/03/21 ?0510  ?NA 131* 133* 132*  ?K 3.4* 3.7 4.0  ?CL 101 100 101  ?CO2 _0 ?GLUCOSE 101* 97 107*  ?BUN 8 5* 5*  ?CREATININE 0.77 0.86 0.79  ?CALCIUM 8.7* 8.9 9.1  ? ? ?  Latest Ref Rng & Units 09/03/2021  ?  5:10 AM 09/02/2021  ?  4:11 AM 09/01/2021  ?  4:59 AM  ?Hepatic Function  ?Total Protein 6.5 - 8.1 g/dL 6.3   6.3   6.0    ?Albumin 3.5 - 5.0 g/dL 2.7   2.7   2.6    ?AST 15 - 41 U/L 77   128   179    ?ALT 0 - 44 U/L 439   523   534    ?Alk Phosphatase 38 - 126 U/L 53   48   48    ?Total Bilirubin 0.3 - 1.2 mg/dL 0.3   0.3   0.1    ?  ? ?PT/INR ?Recent Labs  ?  09/03/21 ?0510  ?LABPROT 13.7  ?INR 1.1  ? ? ? ?  Assessment / Plan:   ?Assessment: ?1.  Hyperemesis gravidarum with elevated LFTs: Newly elevated LFTs noted with ALT initially in the 700s and AST in the 300s, AP and bili normal, they are slowly improving, patent vasculature on ultrasound, gallstones noted but biliary tree nondilated, no abdominal pain, no new meds or supplement use; not related most likely to her hyperemesis ? ? ?Plan: ?1.  I will arrange for recheck of LFTs in our clinic on [address] on Monday, 09/07/2021.  Discussed this with the patient and told her she can arrive anytime that day to have these drawn. ?2.  We will arrange for follow-up in our clinic with Dr. Fuller Plan. ?3.  Recommend the patient stay on scheduled Zofran when she gets discharged as well as steroids. ?4.  There are a few outstanding tests which we can follow-up on with the patient when she is seen in clinic.  One of these is hepatitis E.  I did call [facility] and was told this  would not fully result until 5/11.  At that time we need to call [facility] ourselves [phone], specimen number [id number] and specifically request for them to send these results to [place] via fax. ?5.  Okay with patient discharge home today. ? ?Thank you for your kind consultation.  We will sign off. ? ? ? LOS: 6 days  ? ?Becky Torres Becky Torres  09/03/2021, 10:54 AM ? ?  ?

## 2021-09-04 LAB — MISC LABCORP TEST (SEND OUT)
LabCorp test name: 2012023
Labcorp test code: 9985

## 2021-09-07 ENCOUNTER — Telehealth: Payer: Self-pay | Admitting: General Practice

## 2021-09-07 ENCOUNTER — Encounter: Payer: Self-pay | Admitting: General Practice

## 2021-09-07 DIAGNOSIS — O285 Abnormal chromosomal and genetic finding on antenatal screening of mother: Secondary | ICD-10-CM

## 2021-09-07 NOTE — Telephone Encounter (Signed)
Called patient and discussed her panorama test results. Discussed MFM will call her tomorrow with an ultrasound and genetic counseling appt. Patient verbalized understanding to all.  ?

## 2021-09-10 ENCOUNTER — Encounter: Payer: Self-pay | Admitting: Radiology

## 2021-09-10 ENCOUNTER — Telehealth: Payer: Self-pay

## 2021-09-10 ENCOUNTER — Ambulatory Visit: Payer: Medicaid Other | Attending: Obstetrics and Gynecology

## 2021-09-10 ENCOUNTER — Ambulatory Visit (HOSPITAL_BASED_OUTPATIENT_CLINIC_OR_DEPARTMENT_OTHER): Payer: Medicaid Other

## 2021-09-10 ENCOUNTER — Ambulatory Visit: Payer: Self-pay | Admitting: Genetics

## 2021-09-10 ENCOUNTER — Ambulatory Visit: Payer: Medicaid Other

## 2021-09-10 ENCOUNTER — Other Ambulatory Visit: Payer: Self-pay | Admitting: Obstetrics and Gynecology

## 2021-09-10 ENCOUNTER — Other Ambulatory Visit: Payer: Self-pay | Admitting: *Deleted

## 2021-09-10 ENCOUNTER — Encounter: Payer: Self-pay | Admitting: *Deleted

## 2021-09-10 ENCOUNTER — Ambulatory Visit: Payer: Medicaid Other | Admitting: *Deleted

## 2021-09-10 VITALS — BP 153/94 | HR 113

## 2021-09-10 DIAGNOSIS — K8020 Calculus of gallbladder without cholecystitis without obstruction: Secondary | ICD-10-CM | POA: Insufficient documentation

## 2021-09-10 DIAGNOSIS — O10911 Unspecified pre-existing hypertension complicating pregnancy, first trimester: Secondary | ICD-10-CM | POA: Diagnosis not present

## 2021-09-10 DIAGNOSIS — O26611 Liver and biliary tract disorders in pregnancy, first trimester: Secondary | ICD-10-CM

## 2021-09-10 DIAGNOSIS — D573 Sickle-cell trait: Secondary | ICD-10-CM

## 2021-09-10 DIAGNOSIS — Z3A13 13 weeks gestation of pregnancy: Secondary | ICD-10-CM | POA: Diagnosis not present

## 2021-09-10 DIAGNOSIS — O99611 Diseases of the digestive system complicating pregnancy, first trimester: Secondary | ICD-10-CM | POA: Insufficient documentation

## 2021-09-10 DIAGNOSIS — O288 Other abnormal findings on antenatal screening of mother: Secondary | ICD-10-CM

## 2021-09-10 DIAGNOSIS — Z3A12 12 weeks gestation of pregnancy: Secondary | ICD-10-CM | POA: Diagnosis not present

## 2021-09-10 DIAGNOSIS — O10011 Pre-existing essential hypertension complicating pregnancy, first trimester: Secondary | ICD-10-CM | POA: Diagnosis not present

## 2021-09-10 DIAGNOSIS — O99011 Anemia complicating pregnancy, first trimester: Secondary | ICD-10-CM | POA: Insufficient documentation

## 2021-09-10 DIAGNOSIS — O281 Abnormal biochemical finding on antenatal screening of mother: Secondary | ICD-10-CM

## 2021-09-10 DIAGNOSIS — O0990 Supervision of high risk pregnancy, unspecified, unspecified trimester: Secondary | ICD-10-CM

## 2021-09-10 DIAGNOSIS — O285 Abnormal chromosomal and genetic finding on antenatal screening of mother: Secondary | ICD-10-CM

## 2021-09-10 NOTE — Telephone Encounter (Signed)
Unable to leave message for patient, line rings then goes busy.  ?

## 2021-09-10 NOTE — Telephone Encounter (Signed)
Per Jennifer's hospital note "4.  There are a few outstanding tests which we can follow-up on with the patient when she is seen in clinic.  One of these is hepatitis E.  I did call [facility] and was told this would not fully result until 5/11.  At that time we need to call [facility] ourselves [phone], specimen number [phone] and specifically request for them to send these results to [place] via fax." ? ?Called and spoke with Cordelia Pen at [facility]. She reached out to [employer] to follow up on test results. Cordelia Pen states that [facility] is going to fax her the results and then she will fax it to [place] once she receives it. Will await fax. ?

## 2021-09-10 NOTE — Progress Notes (Signed)
?Name: Becky Torres Indication:  ?NIPS result with low fetal fraction ?Maternal sickle cell trait  ?DOB: 1990-05-26 Age: 31 y.o.   ?EDC: 03/22/2022 LMP: 06/08/2021 Referring Provider:  ?Warden Fillers, MD  ?EGA: [redacted]w[redacted]d Genetic Counselor: ?Teena Dunk, MS, CGC  ?OB Hx: [id number] Date of Appointment: 09/10/2021  ?Accompanied by: Father of the current pregnancy, Otis Peak Face to Face Time: 40 Minutes  ? ?Previous Testing Completed: ?Hemoglobinopathy evaluation from 09/30/2016 reviewed. 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  ?Carrier screening from 08/26/2021 reviewed. Shelbylynn screened to be a carrier for Sickle Cell Trait. She screened to not be a carrier for Cystic Fibrosis (CF), Spinal Muscular Atrophy (SMA), and alpha thalassemia. A negative result on carrier screening reduces the likelihood of being a carrier, however, does not entirely rule out the possibility.  ? ?Medical History:  ?This is Antonieta's 5th pregnancy. She has 3 living children from previous partners. She has had 1 early loss. ?Reports she takes prenatal vitamins. ?Personal history of high blood pressure. Reports she had bleeding in this pregnancy. Reports a UTI in this pregnancy.  ?Denies a personal history of diabetes, thyroid conditions, and seizures. Denies fevers in this pregnancy. Denies using tobacco, alcohol, or street drugs in this pregnancy.  ? ?Family History: A pedigree was created and scanned into Epic under the Media tab. ?Shirlie reports her son from a previous partner is affected with Sickle Cell Disease (HbS/S). She reports her two other children are carriers for Sickle Cell Trait (HbA/S). ?Rika reports that her brother's 32 month old son has Down syndrome (?). ?Maternal ethnicity reported as African American and paternal ethnicity reported as African American. ?Denies Ashkenazi Jewish ancestry. ?Family history not remarkable for consanguinity, individuals with birth  defects, multiple spontaneous abortions, or unexplained neonatal death.  ?   ?Genetic Counseling:  ? ?Low Fetal Fraction. Fetal fraction is an important quality control metric. The laboratory reports there was insufficient fetal (placental) DNA in Breauna's maternal blood sample to obtain a reliable result using standard NIPS methods. Therefore, an additional proprietary analysis was performed, incorporating fetal fraction, maternal age, maternal weight, and gestational age. Based upon the results of the additional analysis, this pregnancy is at high risk for Triploidy, Trisomy 29, or Trisomy 11. The risks for Trisomy 21 and Monosomy X are unchanged. Genetic counseling reviewed the technology of NIPS and the insufficient fetal (placental) DNA result with Sheran Luz and Ivar Drape in detail. We discussed the option of repeating NIPS a second time given that Gayle is farther along in pregnancy and there might be enough fetal fraction to complete the screening. We also discussed CVS/amniocentesis for prenatal diagnosis in the form of a fetal karyotype and fetal microarray. Possible procedural difficulties and complications that can arise include maternal infection, cramping, bleeding, fluid leakage, and/or pregnancy loss. The risk for pregnancy loss with a CVS/amniocentesis is 1/500. We also discussed the option of no further screening/testing and monitoring with only ultrasounds. Joella appeared to understand the information discussed in the genetic counseling session. Keyshawna opted to pursue monitoring with only ultrasounds [facility]. ? ?Maternal Sickle Cell Trait Carrier. Bethany is a carrier of sickle cell trait (Hemoglobin S: Hb A/S). Hemoglobin S is a structural hemoglobin variant that replaces one of the ?-chains of hemoglobin. It is caused by mutations in the HBB gene. Carriers of sickle cell trait typically do not have any clinical features, however, may experience  sickling under  extreme conditions. Individuals who carry sickle cell trait would have a 25% risk to have offspring affected with Sickle Cell Disease if their reproductive partner is also found to be a carrier for a ?-globin chain abnormality (recessive inheritance). ?Sickle Cell Disease caused by the homozygous HBB variant p.Glu6Val (Hb S/S), is the most common cause of Sickle Cell Disease in the [place]. ?Other Sickle Cell Disorders caused by compound heterozygous HBB pathogenic variants includes Sickle-Hemoglobin C Disease (Hb S/C) and two types of Sickle ?-Thalassemia (Hb S/?+ and Hb S/??). ?Other ?-globin chain variants such as D-Punjab, O-Arab, and E also result in Sickle Cell Disorders when inherited with HbS.  ?Most individuals with Sickle Cell Disease are healthy at birth and become symptomatic later in infancy or childhood after fetal hemoglobin levels decrease. Symptoms include infants with spontaneous painful swelling of the hands/feet, recurrent episodes of severe pain with no other identified etiology, unexplained anemia not related to iron deficiency, pallor, jaundice, pneumococcal sepsis or meningitis, severe anemia with splenic enlargement, childhood stroke, etc. Given that Ianna is a carrier of sickle cell trait (Hb A/S) genetic counseling recommended screening Ivar Drape for ? Hemoglobinopathies. Genetic counseling reviewed that Ivar Drape is of African American ancestry, therefore his risk to be a carrier for Sickle Cell Disease is approximately 1 in 10. Ivar Drape appeared to understand the information discussed and declined carrier screening for ? Hemoglobinopathies. Genetic counseling reviewed that Sickle Cell Disease is one of the conditions included in [place]'s newborn screening program. ? ?Family History of Down syndrome? Jnyah reports that her brother's 63 month old son has Down syndrome. She reports this individual's Down syndrome was caused by a medication that his 48 year old mother  took for her symptoms of Multiple Sclerosis when she was pregnant. Kelbi reports her nephew's Down syndrome was diagnosed by a clinical finding at the back of his neck and that he does not have any characteristic facial features of Down syndrome. It is not clear to genetic counseling if Carliyah nephew has had genetic testing or an evaluation by a geneticist. Genetic counseling reviewed with Leanah that to our knowledge a medication exposure cannot cause Down syndrome specifically, rather certain medications when taken in pregnancy may cause other cognitive and developmental issues for a developing baby. We reviewed the process of nondisjunction and how aneuploidy typically occurs. Rilya is encouraged to talk with her brother and his reproductive partner about her nephew's condition, and if possible, obtain medical records for genetic counseling to review.  ? ?Birth Defects. All babies have approximately a 3-5% risk for a birth defect and a majority of these defects cannot be detected through the screening or diagnostic testing listed below. Ultrasound may detect some birth defects, but it may not detect all birth defects. About half of pregnancies with Down syndrome do not show any soft markers on ultrasound. A normal ultrasound does not guarantee a healthy pregnancy. ?  ?  ?Patient Plan: ? ?Proceed with: Routine prenatal care ?Informed consent was obtained. All questions were answered. ? ?Declined:  ?CVS/Amniocentesis for prenatal diagnosis ?Repeat NIPS ordered by the [facility] ?Carrier Screening for Beta Hemoglobinopathies for the father of the pregnancy Ivar Drape)  ? ?Thank you for sharing in the care of [facility] with [place].  ?Please do not hesitate to contact us if you have any questions. ? ?Teena Dunk, MS, CGC ?Certified Genetic Counselor ?

## 2021-09-11 ENCOUNTER — Telehealth: Payer: Self-pay

## 2021-09-11 NOTE — Telephone Encounter (Signed)
I called patient and reviewed her [facility] genetic screening results showing she was positive for sickle cell carrier. Patient verbalized she was aware of these results and has already met with a genetic counselor regarding this. I also reviewed insufficient cells for Panorama with patient and inquired if patient would like to have the Panorama redrawn to obtain results. Patient states she would like a redraw of Panorama at next appointment. I reviewed patient's next appointment date and time with her. Patient verbalized understanding and denies any other questions.  ? ?Paulina Fusi, RN ?09/11/21 ?

## 2021-09-11 NOTE — Telephone Encounter (Signed)
-----   Message from Warden Fillers, MD sent at 09/11/2021  8:33 AM EDT ----- ?[facility] positive for sickle cell carrier, panorama with insufficient cells , offer redraw ?

## 2021-09-11 NOTE — Telephone Encounter (Signed)
Unable to leave message for patient, line rings then goes busy.  ?

## 2021-09-14 NOTE — Telephone Encounter (Signed)
Unable to reach patient x 3, no vm set up. Will send letter to patient.  ?

## 2021-09-16 ENCOUNTER — Encounter (HOSPITAL_COMMUNITY): Payer: Self-pay | Admitting: Obstetrics & Gynecology

## 2021-09-16 ENCOUNTER — Inpatient Hospital Stay (HOSPITAL_COMMUNITY)
Admission: AD | Admit: 2021-09-16 | Discharge: 2021-09-17 | Disposition: A | Discharge status: Home / Self Care | Payer: Medicaid Other | Attending: Obstetrics & Gynecology | Admitting: Obstetrics & Gynecology

## 2021-09-16 ENCOUNTER — Other Ambulatory Visit: Payer: Self-pay

## 2021-09-16 ENCOUNTER — Encounter: Payer: Medicaid Other | Admitting: Obstetrics and Gynecology

## 2021-09-16 DIAGNOSIS — O210 Mild hyperemesis gravidarum: Secondary | ICD-10-CM | POA: Diagnosis not present

## 2021-09-16 DIAGNOSIS — O99891 Other specified diseases and conditions complicating pregnancy: Secondary | ICD-10-CM | POA: Insufficient documentation

## 2021-09-16 DIAGNOSIS — R7989 Other specified abnormal findings of blood chemistry: Secondary | ICD-10-CM

## 2021-09-16 DIAGNOSIS — O10911 Unspecified pre-existing hypertension complicating pregnancy, first trimester: Secondary | ICD-10-CM | POA: Insufficient documentation

## 2021-09-16 DIAGNOSIS — Z3A13 13 weeks gestation of pregnancy: Secondary | ICD-10-CM | POA: Diagnosis not present

## 2021-09-16 DIAGNOSIS — O99011 Anemia complicating pregnancy, first trimester: Secondary | ICD-10-CM | POA: Insufficient documentation

## 2021-09-16 DIAGNOSIS — R7401 Elevation of levels of liver transaminase levels: Secondary | ICD-10-CM | POA: Diagnosis not present

## 2021-09-16 DIAGNOSIS — E860 Dehydration: Secondary | ICD-10-CM

## 2021-09-16 DIAGNOSIS — R748 Abnormal levels of other serum enzymes: Secondary | ICD-10-CM | POA: Diagnosis not present

## 2021-09-16 DIAGNOSIS — O26891 Other specified pregnancy related conditions, first trimester: Secondary | ICD-10-CM | POA: Diagnosis not present

## 2021-09-16 LAB — URINALYSIS, ROUTINE W REFLEX MICROSCOPIC
Bilirubin Urine: NEGATIVE
Glucose, UA: NEGATIVE mg/dL
Hgb urine dipstick: NEGATIVE
Ketones, ur: 80 mg/dL — AB
Nitrite: NEGATIVE
Protein, ur: 30 mg/dL — AB
Specific Gravity, Urine: 1.02 (ref 1.005–1.030)
pH: 5 (ref 5.0–8.0)

## 2021-09-16 LAB — LIPASE, BLOOD: Lipase: 33 U/L (ref 11–51)

## 2021-09-16 LAB — CBC
HCT: 38.6 % (ref 36.0–46.0)
Hemoglobin: 12.9 g/dL (ref 12.0–15.0)
MCH: 24.8 pg — ABNORMAL LOW (ref 26.0–34.0)
MCHC: 33.4 g/dL (ref 30.0–36.0)
MCV: 74.2 fL — ABNORMAL LOW (ref 80.0–100.0)
Platelets: 362 10*3/uL (ref 150–400)
RBC: 5.2 MIL/uL — ABNORMAL HIGH (ref 3.87–5.11)
RDW: 21 % — ABNORMAL HIGH (ref 11.5–15.5)
WBC: 15.8 10*3/uL — ABNORMAL HIGH (ref 4.0–10.5)
nRBC: 0 % (ref 0.0–0.2)

## 2021-09-16 LAB — COMPREHENSIVE METABOLIC PANEL
ALT: 71 U/L — ABNORMAL HIGH (ref 0–44)
AST: 47 U/L — ABNORMAL HIGH (ref 15–41)
Albumin: 3.4 g/dL — ABNORMAL LOW (ref 3.5–5.0)
Alkaline Phosphatase: 74 U/L (ref 38–126)
Anion gap: 9 (ref 5–15)
BUN: 7 mg/dL (ref 6–20)
CO2: 22 mmol/L (ref 22–32)
Calcium: 9.8 mg/dL (ref 8.9–10.3)
Chloride: 104 mmol/L (ref 98–111)
Creatinine, Ser: 0.98 mg/dL (ref 0.44–1.00)
GFR, Estimated: >60 mL/min (ref >60)
Glucose, Bld: 108 mg/dL — ABNORMAL HIGH (ref 70–99)
Potassium: 4 mmol/L (ref 3.5–5.1)
Sodium: 135 mmol/L (ref 135–145)
Total Bilirubin: 0.9 mg/dL (ref 0.3–1.2)
Total Protein: 8.5 g/dL — ABNORMAL HIGH (ref 6.5–8.1)

## 2021-09-16 LAB — AMYLASE: Amylase: 152 U/L — ABNORMAL HIGH (ref 28–100)

## 2021-09-16 MED ORDER — SCOPOLAMINE 1 MG/3DAYS TD PT72
1.0000 | MEDICATED_PATCH | Freq: Once | TRANSDERMAL | Status: DC
  Administered 2021-09-16: 1.5 mg via TRANSDERMAL
  Filled 2021-09-16: qty 1

## 2021-09-16 MED ORDER — ONDANSETRON HCL 4 MG/2ML IJ SOLN: 4.0000 mg | Freq: Once | INTRAMUSCULAR | Status: DC

## 2021-09-16 MED ORDER — SODIUM CHLORIDE 0.9 % IV SOLN
8.0000 mg | Freq: Once | INTRAVENOUS | Status: AC
  Administered 2021-09-17: 8 mg via INTRAVENOUS
  Filled 2021-09-16: qty 4

## 2021-09-16 MED ORDER — LACTATED RINGERS IV SOLN: Freq: Once | INTRAVENOUS | Status: AC

## 2021-09-16 NOTE — MAU Note (Signed)
.  Becky Torres is a 31 y.o. at [redacted]w[redacted]d here in MAU reporting: N/V since Monday with several episodes of emesis today since 0600. Pt reports seeing blood in her emesis. Pt states she has not eaten and drank today. Pt states she taken Zofran and pepcid yesterday, no relief. Pt reports last meal was Sunday, everything else just comes back up. Pt denies VB, LOF, abnormal discharge. Pt reports lower back pain since yesterday.  ? ?Onset of complaint: Monday  ?Pain score: 7/10 ?Vitals:  ? 09/16/21 2136  ?BP: (!) 136/101  ?Pulse: (!) 139  ?Resp: 16  ?Temp: 98.9 ?F (37.2 ?C)  ?SpO2: 99%  ?   ?FHT:151 ?Lab orders placed from triage:   ? ?

## 2021-09-16 NOTE — MAU Provider Note (Signed)
Chief Complaint: Nausea and Emesis   Event Date/Time   First Provider Initiated Contact with Patient 09/16/21 2242        SUBJECTIVE HPI: Becky Torres is a 31 y.o. [id number] at [redacted]w[redacted]d by LMP who presents to maternity admissions reporting recurrent nausea and vomiting   Also has some low back pain.  Has been treated for hyperemesis with elevated transaminases and amylase during this pregnancy.  States did well for a while but now having trouble with meds not helping. . She denies vaginal bleeding, vaginal itching/burning, urinary symptoms, h/a, dizziness, or fever/chills.    Emesis  This is a recurrent problem. There has been no fever. Pertinent negatives include no abdominal pain, chest pain, chills, diarrhea, fever or myalgias. Treatments tried: Zofran on Monday states "it doesn't work"  Back Pain This is a new problem. The current episode started yesterday. The problem is unchanged. The pain is present in the lumbar spine. The quality of the pain is described as aching. The pain does not radiate. Pertinent negatives include no abdominal pain, chest pain or fever. She has tried nothing for the symptoms.   RN Note: Becky Torres is a 31 y.o. at [redacted]w[redacted]d here in MAU reporting: N/V since Monday with several episodes of emesis today since 0600. Pt reports seeing blood in her emesis. Pt states she has not eaten and drank today. Pt states she taken Zofran and pepcid yesterday, no relief. Pt reports last meal was Sunday, everything else just comes back up. Pt denies VB, LOF, abnormal discharge. Pt reports lower back pain since yesterday.     Past Medical History:  Diagnosis Date   Anemia    Chlamydia    Hypertension    chronic, diagnosed at age 31, takes Labetalol   Sickle cell trait (HCC)    No past surgical history on file. Social History   Socioeconomic History   Marital status: Single    Spouse name: Not on file   Number of children: Not on file   Years of education: Not on file    Highest education level: Not on file  Occupational History   Not on file  Tobacco Use   Smoking status: Never   Smokeless tobacco: Never  Vaping Use   Vaping Use: Never used  Substance and Sexual Activity   Alcohol use: No   Drug use: No   Sexual activity: Yes    Birth control/protection: None  Other Topics Concern   Not on file  Social History Narrative   Not on file   Social Determinants of Health   Financial Resource Strain: Not on file  Food Insecurity: Not on file  Transportation Needs: Not on file  Physical Activity: Not on file  Stress: Not on file  Social Connections: Not on file  Intimate Partner Violence: Not on file   No current facility-administered medications on file prior to encounter.   Current Outpatient Medications on File Prior to Encounter  Medication Sig Dispense Refill   acetaminophen (TYLENOL) 325 MG tablet Take 2 tablets (650 mg total) by mouth every 4 (four) hours as needed (for pain scale < 4).     famotidine (PEPCID) 20 MG tablet Take 1 tablet (20 mg total) by mouth every 12 (twelve) hours. (Patient not taking: Reported on 09/10/2021) 60 tablet 2   glycopyrrolate (ROBINUL) 2 MG tablet Take 1 tablet (2 mg total) by mouth 3 (three) times daily. 30 tablet 3   methylPREDNISolone (MEDROL) 4 MG tablet Day 1: 8  mg night> Day 2: 8 mg morning, 4 mg midday, 4 mg night> Day 3 and 4: 8 mg morning, 4 mg midday x 2 days> Day 5 and 6: 8 mg morning x 2 days> Day 7 and 8: 4 mg morning  x 2 days 20 tablet 1   ondansetron (ZOFRAN) 4 MG tablet Take 1 tablet (4 mg total) by mouth every 6 (six) hours as needed for nausea, vomiting or refractory nausea / vomiting. 30 tablet 2   Prenat-Fe Carbonyl-FA-Omega 3 (ONE-A-DAY WOMENS PRENATAL 1) 28-0.8-235 MG CAPS Take 1 capsule by mouth daily.     promethazine (PHENERGAN) 25 MG suppository Place 1 suppository (25 mg total) rectally every 6 (six) hours as needed for nausea or vomiting. (Patient not taking: Reported on 09/10/2021) 12  each 2   scopolamine (TRANSDERM-SCOP) 1 MG/3DAYS Place 1 patch (1.5 mg total) onto the skin every 3 (three) days. 10 patch 12   No Known Allergies  I have reviewed patient's Past Medical Hx, Surgical Hx, Family Hx, Social Hx, medications and allergies.   ROS:  Review of Systems  Constitutional:  Negative for chills and fever.  Cardiovascular:  Negative for chest pain.  Gastrointestinal:  Positive for vomiting. Negative for abdominal pain and diarrhea.  Musculoskeletal:  Positive for back pain. Negative for myalgias.  Review of Systems  Other systems negative   Physical Exam  Physical Exam Patient Vitals for the past 24 hrs:  BP Temp Temp src Pulse Resp SpO2 Height Weight  09/16/21 2136 (!) 136/101 98.9 F (37.2 C) Oral (!) 139 16 99 % 5\' 4"  (1.626 m) 87.8 kg   Constitutional: Well-developed, well-nourished female in no acute distress.  Cardiovascular: tachycardia Respiratory: normal effort GI: Abd soft, non-tender.  MS: Extremities nontender, no edema, normal ROM Neurologic: Alert and oriented x 4.  GU: Neg CVAT.  PELVIC EXAM: deferred  FHT 151 by doppler  LAB RESULTS Results for orders placed or performed during the hospital encounter of 09/16/21 (from the past 24 hour(s))  Urinalysis, Routine w reflex microscopic     Status: Abnormal   Collection Time: 09/16/21  8:23 PM  Result Value Ref Range   Color, Urine AMBER (A) YELLOW   APPearance CLOUDY (A) CLEAR   Specific Gravity, Urine 1.020 1.005 - 1.030   pH 5.0 5.0 - 8.0   Glucose, UA NEGATIVE NEGATIVE mg/dL   Hgb urine dipstick NEGATIVE NEGATIVE   Bilirubin Urine NEGATIVE NEGATIVE   Ketones, ur 80 (A) NEGATIVE mg/dL   Protein, ur 30 (A) NEGATIVE mg/dL   Nitrite NEGATIVE NEGATIVE   Leukocytes,Ua TRACE (A) NEGATIVE   RBC / HPF 0-5 0 - 5 RBC/hpf   WBC, UA 6-10 0 - 5 WBC/hpf   Bacteria, UA RARE (A) NONE SEEN   Squamous Epithelial / LPF 11-20 0 - 5   Mucus PRESENT    Hyaline Casts, UA PRESENT   CBC     Status:  Abnormal   Collection Time: 09/16/21  8:46 PM  Result Value Ref Range   WBC 15.8 (H) 4.0 - 10.5 K/uL   RBC 5.20 (H) 3.87 - 5.11 MIL/uL   Hemoglobin 12.9 12.0 - 15.0 g/dL   HCT 09/18/21 54.6 - 50.3 %   MCV 74.2 (L) 80.0 - 100.0 fL   MCH 24.8 (L) 26.0 - 34.0 pg   MCHC 33.4 30.0 - 36.0 g/dL   RDW 54.6 (H) 56.8 - 12.7 %   Platelets 362 150 - 400 K/uL   nRBC 0.0 0.0 - 0.2 %  Comprehensive metabolic panel     Status: Abnormal   Collection Time: 09/16/21  8:46 PM  Result Value Ref Range   Sodium 135 135 - 145 mmol/L   Potassium 4.0 3.5 - 5.1 mmol/L   Chloride 104 98 - 111 mmol/L   CO2 22 22 - 32 mmol/L   Glucose, Bld 108 (H) 70 - 99 mg/dL   BUN 7 6 - 20 mg/dL   Creatinine, Ser 4.090.98 0.44 - 1.00 mg/dL   Calcium 9.8 8.9 - 81.110.3 mg/dL   Total Protein 8.5 (H) 6.5 - 8.1 g/dL   Albumin 3.4 (L) 3.5 - 5.0 g/dL   AST 47 (H) 15 - 41 U/L   ALT 71 (H) 0 - 44 U/L   Alkaline Phosphatase 74 38 - 126 U/L   Total Bilirubin 0.9 0.3 - 1.2 mg/dL   GFR, Estimated >91>60 >47>60 mL/min   Anion gap 9 5 - 15  Amylase     Status: Abnormal   Collection Time: 09/16/21  8:46 PM  Result Value Ref Range   Amylase 152 (H) 28 - 100 U/L  Lipase, blood     Status: None   Collection Time: 09/16/21  8:46 PM  Result Value Ref Range   Lipase 33 11 - 51 U/L    --/--/B POS (04/27 2345)  IMAGING  MAU Management/MDM: I have reviewed the triage vital signs and the nursing notes.   Pertinent labs & imaging results that were available during my care of the patient were reviewed by me and considered in my medical decision making (see chart for details).      I have reviewed her medical records including past results, notes and treatments.   Ordered IV hydration with LR Zofran for nausea Scopolamine patch.  Will try Small dose of Flexeril for her back pain  She felt some better but was still naueated, so we added Pepcid and B6 We gave a second liter of LR and added a dose of Phenergan She did feel better and was able to  tolerate apple juice She got relief from her back pain from Flexeril   ASSESSMENT Single IUP at 338w3d Hyperemesis Elevated Transaminases and Amylase, improved from before  PLAN Discharge home Has medications at home Message sent to office to start outpatient infusion days  Pt stable at time of discharge. Encouraged to return here if she develops worsening of symptoms, increase in pain, fever, or other concerning symptoms.    Wynelle BourgeoisMarie Arlen Dupuis CNM, MSN Certified Nurse-Midwife 09/16/2021  10:42 PM

## 2021-09-17 ENCOUNTER — Other Ambulatory Visit: Payer: Self-pay

## 2021-09-17 DIAGNOSIS — R7989 Other specified abnormal findings of blood chemistry: Secondary | ICD-10-CM | POA: Diagnosis not present

## 2021-09-17 DIAGNOSIS — O210 Mild hyperemesis gravidarum: Secondary | ICD-10-CM | POA: Diagnosis not present

## 2021-09-17 DIAGNOSIS — Z3A13 13 weeks gestation of pregnancy: Secondary | ICD-10-CM | POA: Diagnosis not present

## 2021-09-17 DIAGNOSIS — R748 Abnormal levels of other serum enzymes: Secondary | ICD-10-CM | POA: Diagnosis not present

## 2021-09-17 MED ORDER — LACTATED RINGERS IV SOLN: Freq: Once | INTRAVENOUS | Status: AC

## 2021-09-17 MED ORDER — SODIUM CHLORIDE 0.9 % IV SOLN
12.5000 mg | Freq: Once | INTRAVENOUS | Status: AC
  Administered 2021-09-17: 12.5 mg via INTRAVENOUS
  Filled 2021-09-17: qty 0.5

## 2021-09-17 MED ORDER — PYRIDOXINE HCL 100 MG/ML IJ SOLN: 100.0000 mg | Freq: Every day | INTRAMUSCULAR | Status: DC

## 2021-09-17 MED ORDER — FAMOTIDINE IN NACL 20-0.9 MG/50ML-% IV SOLN
20.0000 mg | Freq: Once | INTRAVENOUS | Status: AC
  Administered 2021-09-17: 20 mg via INTRAVENOUS
  Filled 2021-09-17: qty 50

## 2021-09-17 MED ORDER — CYCLOBENZAPRINE HCL 5 MG PO TABS
5.0000 mg | ORAL_TABLET | Freq: Once | ORAL | Status: AC
  Administered 2021-09-17: 5 mg via ORAL
  Filled 2021-09-17 (×2): qty 1

## 2021-09-17 MED ORDER — PYRIDOXINE HCL 100 MG/ML IJ SOLN
100.0000 mg | Freq: Once | INTRAMUSCULAR | Status: AC
Start: 2021-09-17 — End: 2021-09-17
  Administered 2021-09-17: 100 mg via INTRAVENOUS
  Filled 2021-09-17: qty 1

## 2021-09-22 ENCOUNTER — Other Ambulatory Visit: Payer: Self-pay | Admitting: Advanced Practice Midwife

## 2021-09-22 ENCOUNTER — Encounter (HOSPITAL_COMMUNITY): Payer: Self-pay | Admitting: Obstetrics & Gynecology

## 2021-09-22 ENCOUNTER — Other Ambulatory Visit: Payer: Self-pay

## 2021-09-22 ENCOUNTER — Inpatient Hospital Stay (HOSPITAL_COMMUNITY)
Admission: AD | Admit: 2021-09-22 | Discharge: 2021-09-23 | Disposition: A | Discharge status: Home / Self Care | Payer: Medicaid Other | Attending: Obstetrics & Gynecology | Admitting: Obstetrics & Gynecology

## 2021-09-22 ENCOUNTER — Inpatient Hospital Stay (HOSPITAL_COMMUNITY): Payer: Medicaid Other

## 2021-09-22 DIAGNOSIS — O99012 Anemia complicating pregnancy, second trimester: Secondary | ICD-10-CM | POA: Diagnosis not present

## 2021-09-22 DIAGNOSIS — O219 Vomiting of pregnancy, unspecified: Secondary | ICD-10-CM

## 2021-09-22 DIAGNOSIS — O210 Mild hyperemesis gravidarum: Secondary | ICD-10-CM | POA: Insufficient documentation

## 2021-09-22 DIAGNOSIS — O26612 Liver and biliary tract disorders in pregnancy, second trimester: Secondary | ICD-10-CM | POA: Diagnosis not present

## 2021-09-22 DIAGNOSIS — R7401 Elevation of levels of liver transaminase levels: Secondary | ICD-10-CM | POA: Diagnosis not present

## 2021-09-22 DIAGNOSIS — K828 Other specified diseases of gallbladder: Secondary | ICD-10-CM | POA: Diagnosis not present

## 2021-09-22 DIAGNOSIS — K117 Disturbances of salivary secretion: Secondary | ICD-10-CM | POA: Diagnosis not present

## 2021-09-22 DIAGNOSIS — O26892 Other specified pregnancy related conditions, second trimester: Secondary | ICD-10-CM | POA: Diagnosis not present

## 2021-09-22 DIAGNOSIS — O99612 Diseases of the digestive system complicating pregnancy, second trimester: Secondary | ICD-10-CM | POA: Insufficient documentation

## 2021-09-22 DIAGNOSIS — R112 Nausea with vomiting, unspecified: Secondary | ICD-10-CM | POA: Diagnosis not present

## 2021-09-22 DIAGNOSIS — O10912 Unspecified pre-existing hypertension complicating pregnancy, second trimester: Secondary | ICD-10-CM | POA: Insufficient documentation

## 2021-09-22 DIAGNOSIS — Z3A14 14 weeks gestation of pregnancy: Secondary | ICD-10-CM | POA: Diagnosis not present

## 2021-09-22 DIAGNOSIS — O26611 Liver and biliary tract disorders in pregnancy, first trimester: Secondary | ICD-10-CM | POA: Diagnosis not present

## 2021-09-22 DIAGNOSIS — R1110 Vomiting, unspecified: Secondary | ICD-10-CM | POA: Diagnosis not present

## 2021-09-22 DIAGNOSIS — R748 Abnormal levels of other serum enzymes: Secondary | ICD-10-CM | POA: Insufficient documentation

## 2021-09-22 DIAGNOSIS — O99619 Diseases of the digestive system complicating pregnancy, unspecified trimester: Secondary | ICD-10-CM | POA: Diagnosis not present

## 2021-09-22 DIAGNOSIS — K8020 Calculus of gallbladder without cholecystitis without obstruction: Secondary | ICD-10-CM

## 2021-09-22 DIAGNOSIS — R7989 Other specified abnormal findings of blood chemistry: Secondary | ICD-10-CM | POA: Diagnosis not present

## 2021-09-22 DIAGNOSIS — O10911 Unspecified pre-existing hypertension complicating pregnancy, first trimester: Secondary | ICD-10-CM

## 2021-09-22 DIAGNOSIS — O0990 Supervision of high risk pregnancy, unspecified, unspecified trimester: Secondary | ICD-10-CM

## 2021-09-22 LAB — URINALYSIS, ROUTINE W REFLEX MICROSCOPIC
Glucose, UA: NEGATIVE mg/dL
Hgb urine dipstick: NEGATIVE
Ketones, ur: 80 mg/dL — AB
Nitrite: NEGATIVE
Protein, ur: 30 mg/dL — AB
Specific Gravity, Urine: 1.023 (ref 1.005–1.030)
pH: 5 (ref 5.0–8.0)

## 2021-09-22 LAB — COMPREHENSIVE METABOLIC PANEL
ALT: 152 U/L — ABNORMAL HIGH (ref 0–44)
AST: 89 U/L — ABNORMAL HIGH (ref 15–41)
Albumin: 2.8 g/dL — ABNORMAL LOW (ref 3.5–5.0)
Alkaline Phosphatase: 58 U/L (ref 38–126)
Anion gap: 12 (ref 5–15)
BUN: <5 mg/dL — ABNORMAL LOW (ref 6–20)
CO2: 21 mmol/L — ABNORMAL LOW (ref 22–32)
Calcium: 8.7 mg/dL — ABNORMAL LOW (ref 8.9–10.3)
Chloride: 102 mmol/L (ref 98–111)
Creatinine, Ser: 0.89 mg/dL (ref 0.44–1.00)
GFR, Estimated: >60 mL/min (ref >60)
Glucose, Bld: 97 mg/dL (ref 70–99)
Potassium: 3.5 mmol/L (ref 3.5–5.1)
Sodium: 135 mmol/L (ref 135–145)
Total Bilirubin: 1.1 mg/dL (ref 0.3–1.2)
Total Protein: 6.8 g/dL (ref 6.5–8.1)

## 2021-09-22 LAB — AMYLASE: Amylase: 103 U/L — ABNORMAL HIGH (ref 28–100)

## 2021-09-22 MED ORDER — CYCLOBENZAPRINE HCL 5 MG PO TABS
10.0000 mg | ORAL_TABLET | Freq: Once | ORAL | Status: AC
Start: 2021-09-22 — End: 2021-09-22
  Administered 2021-09-22: 10 mg via ORAL
  Filled 2021-09-22: qty 2

## 2021-09-22 MED ORDER — LABETALOL HCL 100 MG PO TABS
200.0000 mg | ORAL_TABLET | Freq: Once | ORAL | Status: AC
  Administered 2021-09-22: 200 mg via ORAL
  Filled 2021-09-22: qty 2

## 2021-09-22 MED ORDER — SODIUM CHLORIDE 0.9 % IV SOLN
25.0000 mg | Freq: Once | INTRAVENOUS | Status: AC
  Administered 2021-09-22: 25 mg via INTRAVENOUS
  Filled 2021-09-22: qty 1

## 2021-09-22 MED ORDER — FAMOTIDINE IN NACL 20-0.9 MG/50ML-% IV SOLN
20.0000 mg | Freq: Once | INTRAVENOUS | Status: AC
  Administered 2021-09-22: 20 mg via INTRAVENOUS
  Filled 2021-09-22: qty 50

## 2021-09-22 MED ORDER — PYRIDOXINE HCL 100 MG/ML IJ SOLN
100.0000 mg | Freq: Every day | INTRAMUSCULAR | Status: DC
  Administered 2021-09-22: 100 mg via INTRAVENOUS
  Filled 2021-09-22: qty 1

## 2021-09-22 MED ORDER — LACTATED RINGERS IV SOLN: Freq: Once | INTRAVENOUS | Status: AC

## 2021-09-22 NOTE — Addendum Note (Signed)
Addended by: Aviva Signs on: 09/22/2021 08:33 PM   Modules accepted: Orders

## 2021-09-22 NOTE — Progress Notes (Signed)
Persistent hyperemesis Needs outpatient infusion Will message clinic to schedule

## 2021-09-22 NOTE — MAU Note (Signed)
.  Becky Torres is a 31 y.o. at [redacted]w[redacted]d here in MAU reporting lower pelvic pain and upper abd cramping. Back is burning from upper to lower back. Denies VB or vag discharge. Unable to keep down anything since 09/14/21. Has Zofran for n/v, Scop patch on but nothing is helping. Also takes "spit Med" LMP: EDC 03/22/22 Onset of complaint: pain today and n/v since 5/15 Pain score: 5 Vitals:   09/22/21 1922 09/22/21 1925  BP:  (!) 146/101  Pulse: (!) 128   Resp: 17   Temp: 98.6 F (37 C)   SpO2: 100%      FHT:144 Lab orders placed from triage:   U/A

## 2021-09-22 NOTE — MAU Provider Note (Signed)
Chief Complaint:  Emesis During Pregnancy   Event Date/Time   First Provider Initiated Contact with Patient 09/22/21 2008    HPI: Natale MilchKalisa P Giraud is a 31 y.o. [id number] at 722w1dwho presents to maternity admissions reporting recurrent hyperemesis. Last took zofran at 2pm  Also took Robinul and has Scop Patch on.  Did not take Phenergan suppository or pepcid. . She denies LOF, vaginal bleeding, vaginal itching/burning, urinary symptoms, h/a, dizziness, diarrhea, constipation or fever/chills.   Recently admitted on 4/28-09/03/21 for this. Was given Meds and Steroid taper which helped. Was noted to have cholelithiasis with sludge and stone.  She had elevated Transaminases and amylase.  Emesis  This is a recurrent problem. The problem has been unchanged. There has been no fever. Pertinent negatives include no abdominal pain, chills, diarrhea, fever or myalgias. Treatments tried: zofran and Robinul.    RN Note: Natale MilchKalisa P Withers is a 31 y.o. at 4522w1d here in MAU reporting lower pelvic pain and upper abd cramping. Back is burning from upper to lower back. Denies VB or vag discharge. Unable to keep down anything since 09/14/21. Has Zofran for n/v, Scop patch on but nothing is helping. Also takes "spit Med" LMP: EDC 03/22/22 Onset of complaint: pain today and n/v since 5/15 Pain score: 5   Past Medical History: Past Medical History:  Diagnosis Date   Anemia    Chlamydia    Hypertension    chronic, diagnosed at age 31, takes Labetalol   Sickle cell trait (HCC)     Past obstetric history: OB History  Gravida Para Term Preterm AB Living  5 3 3  0 1 3  SAB IAB Ectopic Multiple Live Births  1 0 0 0 3    # Outcome Date GA Lbr Len/2nd Weight Sex Delivery Anes PTL Lv  5 Current           4 Term 12/31/16 2051w2d 01:33 / 00:20 2719 g F Vag-Spont EPI  LIV  3 SAB 12/10/15 2679w1d         2 Term 09/19/12 1572w4d 14:08 / 00:52 2590 g M Vag-Spont EPI  LIV  1 Term 07/26/10 1355w0d   M Vag-Spont EPI N LIV    Past  Surgical History: History reviewed. No pertinent surgical history.  Family History: Family History  Problem Relation Age of Onset   Hypertension Mother    Obesity Father    Hypertension Brother    Sickle cell anemia Son    Hypertension Maternal Aunt    Hypertension Maternal Grandmother     Social History: Social History   Tobacco Use   Smoking status: Never   Smokeless tobacco: Never  Vaping Use   Vaping Use: Never used  Substance Use Topics   Alcohol use: No   Drug use: No    Allergies: No Known Allergies  Meds:  Medications Prior to Admission  Medication Sig Dispense Refill Last Dose   acetaminophen (TYLENOL) 325 MG tablet Take 2 tablets (650 mg total) by mouth every 4 (four) hours as needed (for pain scale < 4).      famotidine (PEPCID) 20 MG tablet Take 1 tablet (20 mg total) by mouth every 12 (twelve) hours. 60 tablet 2    glycopyrrolate (ROBINUL) 2 MG tablet Take 1 tablet (2 mg total) by mouth 3 (three) times daily. 30 tablet 3    ondansetron (ZOFRAN) 4 MG tablet Take 1 tablet (4 mg total) by mouth every 6 (six) hours as needed for nausea, vomiting or refractory nausea /  vomiting. 30 tablet 2    Prenat-Fe Carbonyl-FA-Omega 3 (ONE-A-DAY WOMENS PRENATAL 1) 28-0.8-235 MG CAPS Take 1 capsule by mouth daily.      promethazine (PHENERGAN) 25 MG suppository Place 1 suppository (25 mg total) rectally every 6 (six) hours as needed for nausea or vomiting. (Patient not taking: Reported on 09/10/2021) 12 each 2    scopolamine (TRANSDERM-SCOP) 1 MG/3DAYS Place 1 patch (1.5 mg total) onto the skin every 3 (three) days. 10 patch 12     I have reviewed patient's Past Medical Hx, Surgical Hx, Family Hx, Social Hx, medications and allergies.   ROS:  Review of Systems  Constitutional:  Negative for chills and fever.  Gastrointestinal:  Positive for vomiting. Negative for abdominal pain and diarrhea.  Musculoskeletal:  Negative for myalgias.  Other systems negative  Physical Exam   Patient Vitals for the past 24 hrs:  BP Temp Pulse Resp SpO2 Height Weight  09/22/21 1925 (!) 146/101 -- -- -- -- -- --  09/22/21 1922 -- 98.6 F (37 C) (!) 128 17 100 % 5\' 4"  (1.626 m) 87.1 kg   163/100 154/106 144/102 146/99 131/94 Labetalol 200mg  given Vitals:   09/23/21 0000 09/23/21 0015 09/23/21 0030 09/23/21 0045  BP: 139/89 124/84 109/69 111/72  Pulse: (!) 111 (!) 106 (!) 106 (!) 104  Resp:      Temp:      SpO2: 99% 100% 100% 99%  Weight:      Height:        Constitutional: Well-developed, well-nourished female in no acute distress.  Cardiovascular: normal rate and rhythm Respiratory: normal effort, clear to auscultation bilaterally GI: Abd soft, non-tender, gravid appropriate for gestational age.   No rebound or guarding. MS: Extremities nontender, no edema, normal ROM Neurologic: Alert and oriented x 4.  GU: Neg CVAT.  FHT:  144   Labs: Results for orders placed or performed during the hospital encounter of 09/22/21 (from the past 24 hour(s))  Urinalysis, Routine w reflex microscopic Urine, Clean Catch     Status: Abnormal   Collection Time: 09/22/21  7:25 PM  Result Value Ref Range   Color, Urine AMBER (A) YELLOW   APPearance HAZY (A) CLEAR   Specific Gravity, Urine 1.023 1.005 - 1.030   pH 5.0 5.0 - 8.0   Glucose, UA NEGATIVE NEGATIVE mg/dL   Hgb urine dipstick NEGATIVE NEGATIVE   Bilirubin Urine SMALL (A) NEGATIVE   Ketones, ur 80 (A) NEGATIVE mg/dL   Protein, ur 30 (A) NEGATIVE mg/dL   Nitrite NEGATIVE NEGATIVE   Leukocytes,Ua SMALL (A) NEGATIVE   RBC / HPF 0-5 0 - 5 RBC/hpf   WBC, UA 6-10 0 - 5 WBC/hpf   Bacteria, UA FEW (A) NONE SEEN   Squamous Epithelial / LPF 11-20 0 - 5   Mucus PRESENT    Hyaline Casts, UA PRESENT   Amylase     Status: Abnormal   Collection Time: 09/22/21 10:10 PM  Result Value Ref Range   Amylase 103 (H) 28 - 100 U/L  Comprehensive metabolic panel     Status: Abnormal   Collection Time: 09/22/21 10:10 PM  Result Value  Ref Range   Sodium 135 135 - 145 mmol/L   Potassium 3.5 3.5 - 5.1 mmol/L   Chloride 102 98 - 111 mmol/L   CO2 21 (L) 22 - 32 mmol/L   Glucose, Bld 97 70 - 99 mg/dL   BUN <5 (L) 6 - 20 mg/dL   Creatinine, Ser 09/24/21 0.44 -  1.00 mg/dL   Calcium 8.7 (L) 8.9 - 10.3 mg/dL   Total Protein 6.8 6.5 - 8.1 g/dL   Albumin 2.8 (L) 3.5 - 5.0 g/dL   AST 89 (H) 15 - 41 U/L   ALT 152 (H) 0 - 44 U/L   Alkaline Phosphatase 58 38 - 126 U/L   Total Bilirubin 1.1 0.3 - 1.2 mg/dL   GFR, Estimated >16 >10 mL/min   Anion gap 12 5 - 15    --/--/B POS (04/27 2345)  Imaging:   MAU Course/MDM: I have reviewed the triage vital signs and the nursing notes.   Pertinent labs & imaging results that were available during my care of the patient were reviewed by me and considered in my medical decision making (see chart for details).      I have reviewed her medical records including past results, notes and treatments.   BPs were elevated tonight. We gave her a dose of Labetalol 200mg  and will order her to take that at home twice a day.   I have ordered labs and reviewed results. Transaminases and amylase remain elevated.  Amylase is lower than before. There is no hypokalemia.   Consult Dr with presentation, exam findings and test results.   Treatments in MAU included IV fluids, Phenergan infusion, Pepcid, pyridoxine,  and Flexeril.  Will recheck RUQ Debroah Loop to see if there are changed to her cholelithiasis (had moderate wall thickening then)>  . This showed no changes since last GB ultrasound. Has appt with GI for followup in early June.   Assessment: Single IUP at [redacted]w[redacted]d Hyperemesis Elevated Transaminases Elevated Amylase Dehydraiton Cholelithiasis Chronic Hypertension  Plan: Discharge home Rx Labetalol 200mg  bid sent to pharmacy Has Antiemetics at home Rx Zofran ODT for use at home instead of tablet Advance diet as tolerated Message sent to start outpatient infusions  Has appt with GI Dr [redacted]w[redacted]d  early June Follow up in Office for prenatal visits  Encouraged to return if she develops worsening of symptoms, increase in pain, fever, or other concerning symptoms.  Pt stable at time of discharge.  Russella Dar CNM, MSN Certified Nurse-Midwife 09/22/2021 8:08 PM

## 2021-09-23 ENCOUNTER — Telehealth: Payer: Self-pay

## 2021-09-23 DIAGNOSIS — R7989 Other specified abnormal findings of blood chemistry: Secondary | ICD-10-CM

## 2021-09-23 DIAGNOSIS — O26611 Liver and biliary tract disorders in pregnancy, first trimester: Secondary | ICD-10-CM | POA: Diagnosis not present

## 2021-09-23 DIAGNOSIS — R7401 Elevation of levels of liver transaminase levels: Secondary | ICD-10-CM | POA: Diagnosis not present

## 2021-09-23 DIAGNOSIS — O219 Vomiting of pregnancy, unspecified: Secondary | ICD-10-CM | POA: Diagnosis not present

## 2021-09-23 DIAGNOSIS — O10911 Unspecified pre-existing hypertension complicating pregnancy, first trimester: Secondary | ICD-10-CM

## 2021-09-23 DIAGNOSIS — R748 Abnormal levels of other serum enzymes: Secondary | ICD-10-CM | POA: Diagnosis not present

## 2021-09-23 DIAGNOSIS — Z3A14 14 weeks gestation of pregnancy: Secondary | ICD-10-CM

## 2021-09-23 DIAGNOSIS — O99619 Diseases of the digestive system complicating pregnancy, unspecified trimester: Secondary | ICD-10-CM

## 2021-09-23 DIAGNOSIS — R1110 Vomiting, unspecified: Secondary | ICD-10-CM

## 2021-09-23 DIAGNOSIS — K8020 Calculus of gallbladder without cholecystitis without obstruction: Secondary | ICD-10-CM

## 2021-09-23 DIAGNOSIS — K117 Disturbances of salivary secretion: Secondary | ICD-10-CM | POA: Diagnosis not present

## 2021-09-23 DIAGNOSIS — K828 Other specified diseases of gallbladder: Secondary | ICD-10-CM

## 2021-09-23 MED ORDER — ONDANSETRON 4 MG PO TBDP
4.0000 mg | ORAL_TABLET | Freq: Four times a day (QID) | ORAL | 0 refills | Status: DC | PRN
Start: 2021-09-23 — End: 2021-10-06

## 2021-09-23 MED ORDER — LABETALOL HCL 200 MG PO TABS: 200.0000 mg | ORAL_TABLET | Freq: Two times a day (BID) | ORAL | 1 refills | Status: AC

## 2021-09-23 NOTE — Telephone Encounter (Signed)
Per [name], CNM pt has hyperemesis and needs Phenergan IV infusions twice a week.  Pt scheduled for first IV infusion on  June 6th @ 0900.  Re: Pt also needs to be scheduled of an OB appt as she does not have one schedule since NEW OB appt.     Pt scheduled for OB appt on 09/29/21 @ 1015.  Pt notified of both appts.  Pt verbalized understanding with no further question.   Becky Torres  09/23/21

## 2021-09-23 NOTE — Telephone Encounter (Signed)
Called and spoke with patient regarding her results. Pt advised to keep her f/u appt as scheduled with Dr. Russella Dar. Pt verbalized understanding and had no concerns at the end of the call.

## 2021-09-23 NOTE — Telephone Encounter (Signed)
-----   Message from Unk Lightning, [place] sent at 09/23/2021 11:08 AM EDT ----- Regarding: hep e Please let patient know that Hepatitis E testing was negative. I have placed paperwork in my folder to be scanned into her chart.  Thanks-JLL

## 2021-09-24 ENCOUNTER — Encounter: Payer: Self-pay | Admitting: Obstetrics and Gynecology

## 2021-09-29 ENCOUNTER — Encounter: Payer: Medicaid Other | Admitting: Family Medicine

## 2021-10-01 DIAGNOSIS — Z419 Encounter for procedure for purposes other than remedying health state, unspecified: Secondary | ICD-10-CM | POA: Diagnosis not present

## 2021-10-03 ENCOUNTER — Other Ambulatory Visit: Payer: Self-pay

## 2021-10-03 ENCOUNTER — Encounter (HOSPITAL_COMMUNITY): Payer: Self-pay | Admitting: Obstetrics and Gynecology

## 2021-10-03 ENCOUNTER — Inpatient Hospital Stay (HOSPITAL_COMMUNITY)
Admission: AD | Admit: 2021-10-03 | Discharge: 2021-10-06 | Disposition: A | Discharge status: Home / Self Care | Payer: Medicaid Other | Attending: Obstetrics and Gynecology | Admitting: Obstetrics and Gynecology

## 2021-10-03 DIAGNOSIS — O10911 Unspecified pre-existing hypertension complicating pregnancy, first trimester: Secondary | ICD-10-CM | POA: Diagnosis present

## 2021-10-03 DIAGNOSIS — O10913 Unspecified pre-existing hypertension complicating pregnancy, third trimester: Secondary | ICD-10-CM | POA: Diagnosis present

## 2021-10-03 DIAGNOSIS — D573 Sickle-cell trait: Secondary | ICD-10-CM | POA: Insufficient documentation

## 2021-10-03 DIAGNOSIS — O211 Hyperemesis gravidarum with metabolic disturbance: Secondary | ICD-10-CM | POA: Diagnosis not present

## 2021-10-03 DIAGNOSIS — R1110 Vomiting, unspecified: Secondary | ICD-10-CM | POA: Diagnosis present

## 2021-10-03 DIAGNOSIS — O10012 Pre-existing essential hypertension complicating pregnancy, second trimester: Secondary | ICD-10-CM | POA: Insufficient documentation

## 2021-10-03 DIAGNOSIS — Z3A15 15 weeks gestation of pregnancy: Secondary | ICD-10-CM | POA: Diagnosis not present

## 2021-10-03 DIAGNOSIS — O99012 Anemia complicating pregnancy, second trimester: Secondary | ICD-10-CM | POA: Diagnosis not present

## 2021-10-03 DIAGNOSIS — E876 Hypokalemia: Secondary | ICD-10-CM

## 2021-10-03 DIAGNOSIS — O210 Mild hyperemesis gravidarum: Secondary | ICD-10-CM | POA: Diagnosis present

## 2021-10-03 LAB — CBC WITH DIFFERENTIAL/PLATELET
Abs Immature Granulocytes: 0.03 10*3/uL (ref 0.00–0.07)
Basophils Absolute: 0 10*3/uL (ref 0.0–0.1)
Basophils Relative: 0 %
Eosinophils Absolute: 0.1 10*3/uL (ref 0.0–0.5)
Eosinophils Relative: 1 %
HCT: 36.8 % (ref 36.0–46.0)
Hemoglobin: 12.4 g/dL (ref 12.0–15.0)
Immature Granulocytes: 0 %
Lymphocytes Relative: 18 %
Lymphs Abs: 1.7 10*3/uL (ref 0.7–4.0)
MCH: 25.2 pg — ABNORMAL LOW (ref 26.0–34.0)
MCHC: 33.7 g/dL (ref 30.0–36.0)
MCV: 74.8 fL — ABNORMAL LOW (ref 80.0–100.0)
Monocytes Absolute: 0.8 10*3/uL (ref 0.1–1.0)
Monocytes Relative: 9 %
Neutro Abs: 6.8 10*3/uL (ref 1.7–7.7)
Neutrophils Relative %: 72 %
Platelets: 301 10*3/uL (ref 150–400)
RBC: 4.92 MIL/uL (ref 3.87–5.11)
RDW: 19.4 % — ABNORMAL HIGH (ref 11.5–15.5)
WBC: 9.4 10*3/uL (ref 4.0–10.5)
nRBC: 0 % (ref 0.0–0.2)

## 2021-10-03 LAB — URINALYSIS, ROUTINE W REFLEX MICROSCOPIC
Bilirubin Urine: NEGATIVE
Glucose, UA: NEGATIVE mg/dL
Hgb urine dipstick: NEGATIVE
Ketones, ur: 80 mg/dL — AB
Nitrite: NEGATIVE
Protein, ur: NEGATIVE mg/dL
Specific Gravity, Urine: 1.013 (ref 1.005–1.030)
pH: 6 (ref 5.0–8.0)

## 2021-10-03 LAB — COMPREHENSIVE METABOLIC PANEL
ALT: 106 U/L — ABNORMAL HIGH (ref 0–44)
AST: 61 U/L — ABNORMAL HIGH (ref 15–41)
Albumin: 2.8 g/dL — ABNORMAL LOW (ref 3.5–5.0)
Alkaline Phosphatase: 63 U/L (ref 38–126)
Anion gap: 12 (ref 5–15)
BUN: <5 mg/dL — ABNORMAL LOW (ref 6–20)
CO2: 23 mmol/L (ref 22–32)
Calcium: 9.2 mg/dL (ref 8.9–10.3)
Chloride: 97 mmol/L — ABNORMAL LOW (ref 98–111)
Creatinine, Ser: 0.94 mg/dL (ref 0.44–1.00)
GFR, Estimated: >60 mL/min (ref >60)
Glucose, Bld: 111 mg/dL — ABNORMAL HIGH (ref 70–99)
Potassium: 2.8 mmol/L — ABNORMAL LOW (ref 3.5–5.1)
Sodium: 132 mmol/L — ABNORMAL LOW (ref 135–145)
Total Bilirubin: 1.1 mg/dL (ref 0.3–1.2)
Total Protein: 7.1 g/dL (ref 6.5–8.1)

## 2021-10-03 LAB — TSH: TSH: <0.01 u[IU]/mL — ABNORMAL LOW (ref 0.350–4.500)

## 2021-10-03 LAB — T4, FREE: Free T4: 1.2 ng/dL — ABNORMAL HIGH (ref 0.61–1.12)

## 2021-10-03 MED ORDER — FAMOTIDINE IN NACL 20-0.9 MG/50ML-% IV SOLN
20.0000 mg | Freq: Once | INTRAVENOUS | Status: AC
Start: 2021-10-03 — End: 2021-10-03
  Administered 2021-10-03: 20 mg via INTRAVENOUS
  Filled 2021-10-03: qty 50

## 2021-10-03 MED ORDER — LACTATED RINGERS IV BOLUS
1000.0000 mL | Freq: Once | INTRAVENOUS | Status: AC
  Administered 2021-10-03: 1000 mL via INTRAVENOUS

## 2021-10-03 MED ORDER — LABETALOL HCL 200 MG PO TABS
200.0000 mg | ORAL_TABLET | Freq: Two times a day (BID) | ORAL | Status: DC
  Administered 2021-10-03 – 2021-10-06 (×6): 200 mg via ORAL
  Filled 2021-10-03 (×6): qty 1

## 2021-10-03 MED ORDER — HYDROXYZINE HCL 50 MG PO TABS
50.0000 mg | ORAL_TABLET | Freq: Four times a day (QID) | ORAL | Status: DC | PRN
Start: 2021-10-03 — End: 2021-10-06

## 2021-10-03 MED ORDER — M.V.I. ADULT IV INJ
Freq: Once | INTRAVENOUS | Status: AC
  Filled 2021-10-03: qty 10

## 2021-10-03 MED ORDER — PROMETHAZINE HCL 25 MG RE SUPP: 12.5000 mg | RECTAL | Status: DC | PRN

## 2021-10-03 MED ORDER — HYDROXYZINE HCL 50 MG/ML IM SOLN
50.0000 mg | Freq: Four times a day (QID) | INTRAMUSCULAR | Status: DC | PRN
Start: 2021-10-03 — End: 2021-10-06

## 2021-10-03 MED ORDER — FAMOTIDINE IN NACL 20-0.9 MG/50ML-% IV SOLN
20.0000 mg | Freq: Two times a day (BID) | INTRAVENOUS | Status: DC
  Administered 2021-10-03: 20 mg via INTRAVENOUS
  Filled 2021-10-03: qty 50

## 2021-10-03 MED ORDER — METOCLOPRAMIDE HCL 10 MG PO TABS
10.0000 mg | ORAL_TABLET | Freq: Four times a day (QID) | ORAL | Status: DC
  Administered 2021-10-04 – 2021-10-06 (×8): 10 mg via ORAL
  Filled 2021-10-03 (×8): qty 1

## 2021-10-03 MED ORDER — METOCLOPRAMIDE HCL 5 MG/ML IJ SOLN
10.0000 mg | Freq: Four times a day (QID) | INTRAMUSCULAR | Status: DC
  Administered 2021-10-03 – 2021-10-05 (×5): 10 mg via INTRAVENOUS
  Filled 2021-10-03 (×6): qty 2

## 2021-10-03 MED ORDER — SODIUM CHLORIDE 0.9 % IV SOLN: INTRAVENOUS | Status: DC

## 2021-10-03 MED ORDER — SCOPOLAMINE 1 MG/3DAYS TD PT72
1.0000 | MEDICATED_PATCH | TRANSDERMAL | Status: DC
  Administered 2021-10-03: 1.5 mg via TRANSDERMAL
  Filled 2021-10-03 (×2): qty 1

## 2021-10-03 MED ORDER — ONDANSETRON HCL 4 MG/2ML IJ SOLN
4.0000 mg | Freq: Three times a day (TID) | INTRAMUSCULAR | Status: DC | PRN
  Administered 2021-10-05: 4 mg via INTRAVENOUS
  Filled 2021-10-03: qty 2

## 2021-10-03 MED ORDER — SODIUM CHLORIDE 0.9 % IV SOLN
8.0000 mg | Freq: Three times a day (TID) | INTRAVENOUS | Status: DC | PRN
Start: 2021-10-03 — End: 2021-10-05

## 2021-10-03 MED ORDER — PROCHLORPERAZINE EDISYLATE 10 MG/2ML IJ SOLN
10.0000 mg | Freq: Once | INTRAMUSCULAR | Status: AC
  Administered 2021-10-03: 10 mg via INTRAVENOUS
  Filled 2021-10-03: qty 2

## 2021-10-03 MED ORDER — PROMETHAZINE HCL 25 MG PO TABS: 12.5000 mg | ORAL_TABLET | ORAL | Status: DC | PRN

## 2021-10-03 MED ORDER — ONDANSETRON HCL 4 MG/2ML IJ SOLN
4.0000 mg | Freq: Once | INTRAMUSCULAR | Status: AC
  Administered 2021-10-03: 4 mg via INTRAVENOUS
  Filled 2021-10-03: qty 2

## 2021-10-03 MED ORDER — PRENATAL MULTIVITAMIN CH
1.0000 | ORAL_TABLET | Freq: Every day | ORAL | Status: DC
  Administered 2021-10-04 – 2021-10-05 (×2): 1 via ORAL
  Filled 2021-10-03 (×2): qty 1

## 2021-10-03 MED ORDER — POTASSIUM CHLORIDE 10 MEQ/100ML IV SOLN
10.0000 meq | INTRAVENOUS | Status: AC
  Administered 2021-10-03 (×4): 10 meq via INTRAVENOUS
  Filled 2021-10-03 (×4): qty 100

## 2021-10-03 MED ORDER — ONDANSETRON 4 MG PO TBDP
4.0000 mg | ORAL_TABLET | Freq: Three times a day (TID) | ORAL | Status: DC | PRN
  Filled 2021-10-03: qty 2

## 2021-10-03 MED ORDER — ACETAMINOPHEN 325 MG PO TABS
650.0000 mg | ORAL_TABLET | ORAL | Status: DC | PRN
  Administered 2021-10-04: 650 mg via ORAL
  Filled 2021-10-03: qty 2

## 2021-10-03 MED ORDER — FAMOTIDINE 20 MG PO TABS: 20.0000 mg | ORAL_TABLET | Freq: Two times a day (BID) | ORAL | Status: DC

## 2021-10-03 NOTE — MAU Note (Signed)
Becky Torres is a 31 y.o. at [redacted]w[redacted]d here in MAU reporting: emesis since Monday, states it has been on and off. States now it is nonstop and she has seen some blood. States she has been taking antiemetics and states it is not working.   Onset of complaint: ongoing  Pain score: 10/10 back pain Vitals:   10/03/21 0924  BP: (!) 132/96  Pulse: (!) 142  Resp: 16  Temp: 98.6 F (37 C)  SpO2: 96%     FHT:141  Lab orders placed from triage: UA

## 2021-10-03 NOTE — H&P (Signed)
History     CSN: [id number]  Arrival date and time: 10/03/21 [id number]   Event Date/Time   First Provider Initiated Contact with Patient 10/03/21 [phone]      Chief Complaint  Patient presents with   Nausea   Back Pain   HPI  Becky Torres is a 31 y.o. [id number] at [redacted]w[redacted]d who presents for evaluation of nausea and vomiting. Patient reports this is an ongoing issue for her. She was seen on 5/23 with the same complaints but was able to be discharged home and was feeling better. She reports when she gets home, she is unable to keep anything down. She states she last took her antiemetics at 0300 and it did not help. She reports throwing up 10-20 times a day. She has busted blood vessels in her right eye due to the vomiting. She also reports her entire back is sore from vomiting. She denies any vaginal bleeding, discharge, and leaking of fluid. Denies any constipation, diarrhea or any urinary complaints. Reports normal fetal movement.   OB History     Gravida  5   Para  3   Term  3   Preterm  0   AB  1   Living  3      SAB  1   IAB  0   Ectopic  0   Multiple  0   Live Births  3           Past Medical History:  Diagnosis Date   Anemia    Chlamydia    Hypertension    chronic, diagnosed at age 69, takes Labetalol   Sickle cell trait ([place])     History reviewed. No pertinent surgical history.  Family History  Problem Relation Age of Onset   Hypertension Mother    Obesity Father    Hypertension Brother    Sickle cell anemia Son    Hypertension Maternal Aunt    Hypertension Maternal Grandmother     Social History   Tobacco Use   Smoking status: Never   Smokeless tobacco: Never  Vaping Use   Vaping Use: Never used  Substance Use Topics   Alcohol use: No   Drug use: No    Allergies: No Known Allergies  Medications Prior to Admission  Medication Sig Dispense Refill Last Dose   famotidine (PEPCID) 20 MG tablet Take 1 tablet (20 mg total) by mouth every  12 (twelve) hours. 60 tablet 2 10/02/2021 at 0300   glycopyrrolate (ROBINUL) 2 MG tablet Take 1 tablet (2 mg total) by mouth 3 (three) times daily. 30 tablet 3 10/02/2021 at 0300   ondansetron (ZOFRAN-ODT) 4 MG disintegrating tablet Take 1 tablet (4 mg total) by mouth every 6 (six) hours as needed for nausea. 20 tablet 0 10/02/2021 at 0300   scopolamine (TRANSDERM-SCOP) 1 MG/3DAYS Place 1 patch (1.5 mg total) onto the skin every 3 (three) days. 10 patch 12 10/02/2021   acetaminophen (TYLENOL) 325 MG tablet Take 2 tablets (650 mg total) by mouth every 4 (four) hours as needed (for pain scale < 4).      labetalol (NORMODYNE) 200 MG tablet Take 1 tablet (200 mg total) by mouth every 12 (twelve) hours. 60 tablet 1    Prenat-Fe Carbonyl-FA-Omega 3 (ONE-A-DAY WOMENS PRENATAL 1) 28-0.8-235 MG CAPS Take 1 capsule by mouth daily.      promethazine (PHENERGAN) 25 MG suppository Place 1 suppository (25 mg total) rectally every 6 (six) hours as needed for nausea or  vomiting. (Patient not taking: Reported on 09/10/2021) 12 each 2     Review of Systems  Constitutional: Negative.  Negative for fatigue and fever.  HENT: Negative.    Respiratory: Negative.  Negative for shortness of breath.   Cardiovascular: Negative.  Negative for chest pain.  Gastrointestinal:  Positive for nausea and vomiting. Negative for abdominal pain, constipation and diarrhea.  Genitourinary: Negative.  Negative for dysuria, vaginal bleeding and vaginal discharge.  Musculoskeletal:  Positive for back pain.  Neurological: Negative.  Negative for dizziness and headaches.  Physical Exam   Blood pressure (!) 141/102, pulse (!) 132, temperature 98.6 F (37 C), temperature source Oral, resp. rate 16, height 5\' 4"  (1.626 m), weight 85.4 kg, last menstrual period 06/08/2021, SpO2 96 %.  Patient Vitals for the past 24 hrs:  BP Temp Temp src Pulse Resp SpO2 Height Weight  10/03/21 0943 (!) 141/102 -- -- (!) 132 -- -- -- --  10/03/21 0924 (!) 132/96  98.6 F (37 C) Oral (!) 142 16 96 % -- --  10/03/21 0918 -- -- -- -- -- -- 5\' 4"  (1.626 m) 85.4 kg    Physical Exam Vitals and nursing note reviewed.  Constitutional:      General: She is not in acute distress.    Appearance: She is well-developed. She is ill-appearing.  HENT:     Head: Normocephalic.  Eyes:     Conjunctiva/sclera:     Right eye: Hemorrhage present.     Pupils: Pupils are equal, round, and reactive to light.  Cardiovascular:     Rate and Rhythm: Regular rhythm. Tachycardia present.     Heart sounds: Normal heart sounds.  Pulmonary:     Effort: Pulmonary effort is normal. No respiratory distress.     Breath sounds: Normal breath sounds.  Abdominal:     General: Bowel sounds are normal. There is no distension.     Palpations: Abdomen is soft.     Tenderness: There is no abdominal tenderness.  Skin:    General: Skin is warm and dry.  Neurological:     Mental Status: She is alert and oriented to person, place, and time.  Psychiatric:        Mood and Affect: Mood normal.        Behavior: Behavior normal.        Thought Content: Thought content normal.        Judgment: Judgment normal.   FHT: 141 bpm  MAU Course  Procedures  Results for orders placed or performed during the hospital encounter of 10/03/21 (from the past 24 hour(s))  CBC with Differential/Platelet     Status: Abnormal   Collection Time: 10/03/21 10:06 AM  Result Value Ref Range   WBC 9.4 4.0 - 10.5 K/uL   RBC 4.92 3.87 - 5.11 MIL/uL   Hemoglobin 12.4 12.0 - 15.0 g/dL   HCT 36.8 36.0 - 46.0 %   MCV 74.8 (L) 80.0 - 100.0 fL   MCH 25.2 (L) 26.0 - 34.0 pg   MCHC 33.7 30.0 - 36.0 g/dL   RDW 19.4 (H) 11.5 - 15.5 %   Platelets 301 150 - 400 K/uL   nRBC 0.0 0.0 - 0.2 %   Neutrophils Relative % 72 %   Neutro Abs 6.8 1.7 - 7.7 K/uL   Lymphocytes Relative 18 %   Lymphs Abs 1.7 0.7 - 4.0 K/uL   Monocytes Relative 9 %   Monocytes Absolute 0.8 0.1 - 1.0 K/uL   Eosinophils Relative  1 %    Eosinophils Absolute 0.1 0.0 - 0.5 K/uL   Basophils Relative 0 %   Basophils Absolute 0.0 0.0 - 0.1 K/uL   Immature Granulocytes 0 %   Abs Immature Granulocytes 0.03 0.00 - 0.07 K/uL  Comprehensive metabolic panel     Status: Abnormal   Collection Time: 10/03/21 10:06 AM  Result Value Ref Range   Sodium 132 (L) 135 - 145 mmol/L   Potassium 2.8 (L) 3.5 - 5.1 mmol/L   Chloride 97 (L) 98 - 111 mmol/L   CO2 23 22 - 32 mmol/L   Glucose, Bld 111 (H) 70 - 99 mg/dL   BUN <5 (L) 6 - 20 mg/dL   Creatinine, Ser 0.94 0.44 - 1.00 mg/dL   Calcium 9.2 8.9 - 10.3 mg/dL   Total Protein 7.1 6.5 - 8.1 g/dL   Albumin 2.8 (L) 3.5 - 5.0 g/dL   AST 61 (H) 15 - 41 U/L   ALT 106 (H) 0 - 44 U/L   Alkaline Phosphatase 63 38 - 126 U/L   Total Bilirubin 1.1 0.3 - 1.2 mg/dL   GFR, Estimated >60 >60 mL/min   Anion gap 12 5 - 15    MDM Labs ordered and reviewed.   Patient tachycardic on arrival.   UA CBC with Diff CMP  LR bolus Pepcid  Zofran Scop patch Multivitamin Infusion  CNM consulted with Dr. Nelda Marseille regarding presentation and results- MD recommends admission to [facility] for management of hyperemesis  Assessment and Plan   1. Hyperemesis   2. Hypokalemia   3. [redacted] weeks gestation of pregnancy    -Admit to [facility] turned over to MD  Wende Mott, CNM 10/03/2021, 9:47 AM

## 2021-10-04 DIAGNOSIS — O210 Mild hyperemesis gravidarum: Secondary | ICD-10-CM

## 2021-10-04 LAB — BASIC METABOLIC PANEL
Anion gap: 7 (ref 5–15)
BUN: <5 mg/dL — ABNORMAL LOW (ref 6–20)
CO2: 23 mmol/L (ref 22–32)
Calcium: 8 mg/dL — ABNORMAL LOW (ref 8.9–10.3)
Chloride: 105 mmol/L (ref 98–111)
Creatinine, Ser: 0.72 mg/dL (ref 0.44–1.00)
GFR, Estimated: >60 mL/min (ref >60)
Glucose, Bld: 93 mg/dL (ref 70–99)
Potassium: 3 mmol/L — ABNORMAL LOW (ref 3.5–5.1)
Sodium: 135 mmol/L (ref 135–145)

## 2021-10-04 MED ORDER — POLYETHYLENE GLYCOL 3350 17 G PO PACK
17.0000 g | PACK | Freq: Every day | ORAL | Status: DC
  Filled 2021-10-04 (×2): qty 1

## 2021-10-04 MED ORDER — BISACODYL 5 MG PO TBEC
5.0000 mg | DELAYED_RELEASE_TABLET | Freq: Every day | ORAL | Status: DC
  Administered 2021-10-06: 5 mg via ORAL
  Filled 2021-10-04 (×3): qty 1

## 2021-10-04 MED ORDER — POTASSIUM CHLORIDE 10 MEQ/100ML IV SOLN
10.0000 meq | INTRAVENOUS | Status: AC
  Administered 2021-10-04 (×4): 10 meq via INTRAVENOUS
  Filled 2021-10-04 (×4): qty 100

## 2021-10-04 MED ORDER — GLYCERIN (LAXATIVE) 2 G RE SUPP
1.0000 | Freq: Every day | RECTAL | Status: DC | PRN
Start: 2021-10-04 — End: 2021-10-06

## 2021-10-04 MED ORDER — FAMOTIDINE 20 MG PO TABS
20.0000 mg | ORAL_TABLET | Freq: Every day | ORAL | Status: DC
  Administered 2021-10-04 – 2021-10-06 (×3): 20 mg via ORAL
  Filled 2021-10-04 (×3): qty 1

## 2021-10-04 NOTE — Progress Notes (Signed)
Patient ID: Becky Torres, female   DOB: Sep 29, 1990, 31 y.o.   MRN: [id number] ACULTY PRACTICE ANTEPARTUM COMPREHENSIVE PROGRESS NOTE  Becky Torres is a 31 y.o. [id number] at [redacted]w[redacted]d  who is admitted for hyperemsis Fetal presentation is unsure. Length of Stay:  0  Days  Subjective: Feels better. N/V much improved. Tolerating clears Patient reports good fetal movement.  She reports no uterine contractions, no bleeding and no loss of fluid per vagina.  Vitals:  Blood pressure 108/68, pulse 93, temperature 98.4 F (36.9 C), temperature source Oral, resp. rate 16, height 5\' 4"  (1.626 m), weight 89.4 kg, last menstrual period 06/08/2021, SpO2 99 %.  Physical Examination: Lungs clear  Heart RRR Abd soft + BS  Fetal Monitoring:   + FHT's  Labs:  Results for orders placed or performed during the hospital encounter of 10/03/21 (from the past 24 hour(s))  CBC with Differential/Platelet   Collection Time: 10/03/21 10:06 AM  Result Value Ref Range   WBC 9.4 4.0 - 10.5 K/uL   RBC 4.92 3.87 - 5.11 MIL/uL   Hemoglobin 12.4 12.0 - 15.0 g/dL   HCT 12/03/21 72.5 - 36.6 %   MCV 74.8 (L) 80.0 - 100.0 fL   MCH 25.2 (L) 26.0 - 34.0 pg   MCHC 33.7 30.0 - 36.0 g/dL   RDW 44.0 (H) 34.7 - 42.5 %   Platelets 301 150 - 400 K/uL   nRBC 0.0 0.0 - 0.2 %   Neutrophils Relative % 72 %   Neutro Abs 6.8 1.7 - 7.7 K/uL   Lymphocytes Relative 18 %   Lymphs Abs 1.7 0.7 - 4.0 K/uL   Monocytes Relative 9 %   Monocytes Absolute 0.8 0.1 - 1.0 K/uL   Eosinophils Relative 1 %   Eosinophils Absolute 0.1 0.0 - 0.5 K/uL   Basophils Relative 0 %   Basophils Absolute 0.0 0.0 - 0.1 K/uL   Immature Granulocytes 0 %   Abs Immature Granulocytes 0.03 0.00 - 0.07 K/uL  Comprehensive metabolic panel   Collection Time: 10/03/21 10:06 AM  Result Value Ref Range   Sodium 132 (L) 135 - 145 mmol/L   Potassium 2.8 (L) 3.5 - 5.1 mmol/L   Chloride 97 (L) 98 - 111 mmol/L   CO2 23 22 - 32 mmol/L   Glucose, Bld 111 (H) 70 - 99 mg/dL    BUN <5 (L) 6 - 20 mg/dL   Creatinine, Ser 12/03/21 0.44 - 1.00 mg/dL   Calcium 9.2 8.9 - 3.87 mg/dL   Total Protein 7.1 6.5 - 8.1 g/dL   Albumin 2.8 (L) 3.5 - 5.0 g/dL   AST 61 (H) 15 - 41 U/L   ALT 106 (H) 0 - 44 U/L   Alkaline Phosphatase 63 38 - 126 U/L   Total Bilirubin 1.1 0.3 - 1.2 mg/dL   GFR, Estimated 56.4 >33 mL/min   Anion gap 12 5 - 15  TSH   Collection Time: 10/03/21 10:06 AM  Result Value Ref Range   TSH <0.010 (L) 0.350 - 4.500 uIU/mL  T4, free   Collection Time: 10/03/21 10:06 AM  Result Value Ref Range   Free T4 1.20 (H) 0.61 - 1.12 ng/dL  Urinalysis, Routine w reflex microscopic Urine, Clean Catch   Collection Time: 10/03/21  5:05 PM  Result Value Ref Range   Color, Urine AMBER (A) YELLOW   APPearance HAZY (A) CLEAR   Specific Gravity, Urine 1.013 1.005 - 1.030   pH 6.0 5.0 - 8.0  Glucose, UA NEGATIVE NEGATIVE mg/dL   Hgb urine dipstick NEGATIVE NEGATIVE   Bilirubin Urine NEGATIVE NEGATIVE   Ketones, ur 80 (A) NEGATIVE mg/dL   Protein, ur NEGATIVE NEGATIVE mg/dL   Nitrite NEGATIVE NEGATIVE   Leukocytes,Ua TRACE (A) NEGATIVE   RBC / HPF 0-5 0 - 5 RBC/hpf   WBC, UA 0-5 0 - 5 WBC/hpf   Bacteria, UA RARE (A) NONE SEEN   Squamous Epithelial / LPF 0-5 0 - 5   Mucus PRESENT    Hyaline Casts, UA PRESENT   Basic metabolic panel   Collection Time: 10/04/21  4:23 AM  Result Value Ref Range   Sodium 135 135 - 145 mmol/L   Potassium 3.0 (L) 3.5 - 5.1 mmol/L   Chloride 105 98 - 111 mmol/L   CO2 23 22 - 32 mmol/L   Glucose, Bld 93 70 - 99 mg/dL   BUN <5 (L) 6 - 20 mg/dL   Creatinine, Ser 1.61 0.44 - 1.00 mg/dL   Calcium 8.0 (L) 8.9 - 10.3 mg/dL   GFR, Estimated >09 >60 mL/min   Anion gap 7 5 - 15    Imaging Studies:    NA   Medications:  Scheduled  famotidine  20 mg Oral Daily   labetalol  200 mg Oral BID   metoCLOPramide  10 mg Oral Q6H   Or   metoCLOPramide (REGLAN) injection  10 mg Intravenous Q6H   prenatal multivitamin  1 tablet Oral Q1200    scopolamine  1 patch Transdermal Q72H   I have reviewed the patient's current medications.  ASSESSMENT: IUP 15 6/7 weeks Hyperemesis CHTN Hypokalemia Increased LFT's   PLAN: Improved. BP stable on current meds. Will advance diet. Replete K. Recheck labs in AM Continue routine antenatal care.   Hermina Staggers 10/04/2021,8:01 AM

## 2021-10-05 DIAGNOSIS — R1110 Vomiting, unspecified: Secondary | ICD-10-CM

## 2021-10-05 DIAGNOSIS — O10911 Unspecified pre-existing hypertension complicating pregnancy, first trimester: Secondary | ICD-10-CM | POA: Diagnosis not present

## 2021-10-05 LAB — COMPREHENSIVE METABOLIC PANEL
ALT: 67 U/L — ABNORMAL HIGH (ref 0–44)
AST: 50 U/L — ABNORMAL HIGH (ref 15–41)
Albumin: 2.2 g/dL — ABNORMAL LOW (ref 3.5–5.0)
Alkaline Phosphatase: 39 U/L (ref 38–126)
Anion gap: 7 (ref 5–15)
BUN: <5 mg/dL — ABNORMAL LOW (ref 6–20)
CO2: 20 mmol/L — ABNORMAL LOW (ref 22–32)
Calcium: 8.2 mg/dL — ABNORMAL LOW (ref 8.9–10.3)
Chloride: 109 mmol/L (ref 98–111)
Creatinine, Ser: 0.8 mg/dL (ref 0.44–1.00)
GFR, Estimated: >60 mL/min (ref >60)
Glucose, Bld: 86 mg/dL (ref 70–99)
Potassium: 3.3 mmol/L — ABNORMAL LOW (ref 3.5–5.1)
Sodium: 136 mmol/L (ref 135–145)
Total Bilirubin: 0.4 mg/dL (ref 0.3–1.2)
Total Protein: 5.2 g/dL — ABNORMAL LOW (ref 6.5–8.1)

## 2021-10-05 MED ORDER — ONDANSETRON 4 MG PO TBDP
4.0000 mg | ORAL_TABLET | Freq: Four times a day (QID) | ORAL | Status: DC | PRN
  Administered 2021-10-05: 8 mg via ORAL

## 2021-10-05 MED ORDER — GLYCOPYRROLATE 1 MG PO TABS
2.0000 mg | ORAL_TABLET | Freq: Three times a day (TID) | ORAL | Status: DC | PRN
  Administered 2021-10-05 – 2021-10-06 (×2): 2 mg via ORAL
  Filled 2021-10-05 (×2): qty 2

## 2021-10-05 MED ORDER — SODIUM CHLORIDE 0.9 % IV SOLN: 8.0000 mg | Freq: Three times a day (TID) | INTRAVENOUS | Status: DC | PRN

## 2021-10-05 MED ORDER — POTASSIUM CHLORIDE CRYS ER 20 MEQ PO TBCR
40.0000 meq | EXTENDED_RELEASE_TABLET | Freq: Two times a day (BID) | ORAL | Status: DC
  Administered 2021-10-05 – 2021-10-06 (×3): 40 meq via ORAL
  Filled 2021-10-05 (×3): qty 2

## 2021-10-05 MED ORDER — ONDANSETRON HCL 4 MG/2ML IJ SOLN
4.0000 mg | Freq: Three times a day (TID) | INTRAMUSCULAR | Status: DC | PRN
  Administered 2021-10-06: 4 mg via INTRAVENOUS
  Filled 2021-10-05: qty 2

## 2021-10-05 NOTE — Progress Notes (Signed)
FACULTY PRACTICE ANTEPARTUM PROGRESS NOTE  Becky Torres is a 31 y.o. [id number] at [redacted]w[redacted]d who is admitted for hyperemesis.  Estimated Date of Delivery: 03/22/22 Fetal presentation is unsure.  Length of Stay:  2 Days. Admitted 10/03/2021  Subjective: Pt seen.  She notes that she still has some nausea and has not tried any solid food today.  Pt notes she had one episode of emesis the previous eveniong but none today.  Pt advised MD would like to confirm tolerance to diet before discharging home.  She denies uterine contractions, denies bleeding and leaking of fluid per vagina.  Vitals:  Blood pressure 139/89, pulse 94, temperature 98.2 F (36.8 C), temperature source Oral, resp. rate 18, height 5\' 4"  (1.626 m), weight 91.6 kg, last menstrual period 06/08/2021, SpO2 97 %. Physical Examination: CONSTITUTIONAL: Well-developed, well-nourished female in no acute distress.  HENT:  Normocephalic, atraumatic, External right and left ear normal. Oropharynx is clear and moist EYES: Conjunctivae and EOM are normal.  \NECK: Normal range of motion, supple, no masses. SKIN: Skin is warm and dry. No rash noted. Not diaphoretic. No erythema. No pallor. NEUROLGIC: Alert and oriented to person, place, and time. Normal reflexes, muscle tone coordination. No cranial nerve deficit noted. PSYCHIATRIC: Normal mood and affect. Normal behavior. Normal judgment and thought content. CARDIOVASCULAR: Normal heart rate noted, regular rhythm RESPIRATORY: Effort and breath sounds normal, no problems with respiration noted MUSCULOSKELETAL: Normal range of motion. No edema and no tenderness. ABDOMEN: Soft, nontender, nondistended, gravid. CERVIX: deferred   Results for orders placed or performed during the hospital encounter of 10/03/21 (from the past 48 hour(s))  Urinalysis, Routine w reflex microscopic Urine, Clean Catch     Status: Abnormal   Collection Time: 10/03/21  5:05 PM  Result Value Ref Range   Color, Urine  AMBER (A) YELLOW    Comment: BIOCHEMICALS MAY BE AFFECTED BY COLOR   APPearance HAZY (A) CLEAR   Specific Gravity, Urine 1.013 1.005 - 1.030   pH 6.0 5.0 - 8.0   Glucose, UA NEGATIVE NEGATIVE mg/dL   Hgb urine dipstick NEGATIVE NEGATIVE   Bilirubin Urine NEGATIVE NEGATIVE   Ketones, ur 80 (A) NEGATIVE mg/dL   Protein, ur NEGATIVE NEGATIVE mg/dL   Nitrite NEGATIVE NEGATIVE   Leukocytes,Ua TRACE (A) NEGATIVE   RBC / HPF 0-5 0 - 5 RBC/hpf   WBC, UA 0-5 0 - 5 WBC/hpf   Bacteria, UA RARE (A) NONE SEEN   Squamous Epithelial / LPF 0-5 0 - 5   Mucus PRESENT    Hyaline Casts, UA PRESENT     Comment: Performed at [facility], [address] [place], [place]  Basic metabolic panel     Status: Abnormal   Collection Time: 10/04/21  4:23 AM  Result Value Ref Range   Sodium 135 135 - 145 mmol/L   Potassium 3.0 (L) 3.5 - 5.1 mmol/L   Chloride 105 98 - 111 mmol/L   CO2 23 22 - 32 mmol/L   Glucose, Bld 93 70 - 99 mg/dL    Comment: Glucose reference range applies only to samples taken after fasting for at least 8 hours.   BUN <5 (L) 6 - 20 mg/dL   Creatinine, Ser 12/04/21 0.44 - 1.00 mg/dL   Calcium 8.0 (L) 8.9 - 10.3 mg/dL   GFR, Estimated 1.94 >17 mL/min    Comment: (NOTE) Calculated using the CKD-EPI Creatinine Equation (2021)    Anion gap 7 5 - 15  Comment: Performed at [facility], [address], [place], [place] [address]  Comprehensive metabolic panel     Status: Abnormal   Collection Time: 10/05/21  4:32 AM  Result Value Ref Range   Sodium 136 135 - 145 mmol/L   Potassium 3.3 (L) 3.5 - 5.1 mmol/L   Chloride 109 98 - 111 mmol/L   CO2 20 (L) 22 - 32 mmol/L   Glucose, Bld 86 70 - 99 mg/dL    Comment: Glucose reference range applies only to samples taken after fasting for at least 8 hours.   BUN <5 (L) 6 - 20 mg/dL   Creatinine, Ser 2.83 0.44 - 1.00 mg/dL   Calcium 8.2 (L) 8.9 - 10.3 mg/dL   Total Protein 5.2 (L) 6.5 - 8.1 g/dL   Albumin 2.2 (L) 3.5 - 5.0  g/dL   AST 50 (H) 15 - 41 U/L   ALT 67 (H) 0 - 44 U/L   Alkaline Phosphatase 39 38 - 126 U/L   Total Bilirubin 0.4 0.3 - 1.2 mg/dL   GFR, Estimated >66 >29 mL/min    Comment: (NOTE) Calculated using the CKD-EPI Creatinine Equation (2021)    Anion gap 7 5 - 15    Comment: Performed at [facility], [address] [place], [place] [address]    I have reviewed the patient's current medications.  ASSESSMENT: Active Problems:   Chronic hypertension complicating or reason for care during pregnancy, first trimester   Hyperemesis   PLAN: Hyperemesis:  continue timed antiemetics.  Would like trial of diet tolerance before discharge home. Consider discharge home this afternoon if she tolerates lunch.  Hypokalemia: replace with oral K dur if possible  Continue routine antenatal care.   [name], MD [facility] Faculty Attending, [facility] 10/05/2021 11:31 AM

## 2021-10-06 ENCOUNTER — Other Ambulatory Visit (HOSPITAL_COMMUNITY): Payer: Self-pay

## 2021-10-06 ENCOUNTER — Inpatient Hospital Stay (HOSPITAL_COMMUNITY): Admission: RE | Admit: 2021-10-06 | Payer: Medicaid Other | Source: Ambulatory Visit

## 2021-10-06 DIAGNOSIS — O10911 Unspecified pre-existing hypertension complicating pregnancy, first trimester: Secondary | ICD-10-CM

## 2021-10-06 DIAGNOSIS — R1110 Vomiting, unspecified: Secondary | ICD-10-CM | POA: Diagnosis not present

## 2021-10-06 MED ORDER — FAMOTIDINE 20 MG PO TABS
20.0000 mg | ORAL_TABLET | Freq: Every day | ORAL | 1 refills | Status: DC
  Filled 2021-10-06: qty 30, 30d supply, fill #0

## 2021-10-06 MED ORDER — ONDANSETRON 4 MG PO TBDP
4.0000 mg | ORAL_TABLET | Freq: Four times a day (QID) | ORAL | 0 refills | Status: DC | PRN
  Filled 2021-10-06: qty 80, 20d supply, fill #0

## 2021-10-06 MED ORDER — METOCLOPRAMIDE HCL 10 MG PO TABS
10.0000 mg | ORAL_TABLET | Freq: Four times a day (QID) | ORAL | 0 refills | Status: DC
  Filled 2021-10-06: qty 90, 23d supply, fill #0

## 2021-10-06 MED ORDER — PROMETHAZINE HCL 12.5 MG PO TABS
12.5000 mg | ORAL_TABLET | ORAL | 0 refills | Status: DC | PRN
  Filled 2021-10-06: qty 30, 3d supply, fill #0

## 2021-10-06 MED ORDER — GLYCOPYRROLATE 1 MG PO TABS
2.0000 mg | ORAL_TABLET | Freq: Three times a day (TID) | ORAL | 0 refills | Status: DC | PRN
  Filled 2021-10-06: qty 90, 15d supply, fill #0

## 2021-10-06 NOTE — Discharge Summary (Signed)
Antenatal Physician Discharge Summary  Patient ID: Becky Torres MRN: [id number] DOB/AGE: 1990-10-02 31 y.o.  Admit date: 10/03/2021 Discharge date: 10/06/2021  Admission Diagnoses:  hyperemesis gravidarum, chronic hypertension  Discharge Diagnoses: hyperemesis gravidarum, chronic hypertension   Prenatal Procedures: none  Consults: none  Hospital Course:  CALEIGH RABELO is a 31 y.o. A6T0160 with IUP at 48w1dadmitted for nausea and vomiting.  The patient had intractable vomiting and was having difficulty tolerating fluids.  The patient was also found to be hypokalemic.She received IV hydration and multiple antiemetics.  Potassium was replaced and the patient was able to slowly advance her diet.  On 10/05/21 the patient was able to tolerate some solid food and was then discharged on 10/06/21. She was deemed stable for discharge to home with outpatient follow up.  Discharge Exam: Temp:  [97.9 F (36.6 C)-98.9 F (37.2 C)] 98.1 F (36.7 C) (06/06 0730) Pulse Rate:  [77-101] 77 (06/06 0730) Resp:  [16-18] 16 (06/06 0730) BP: (127-137)/(75-90) 135/90 (06/06 0730) SpO2:  [99 %-100 %] 99 % (06/06 0502) Weight:  [89.4 kg] 89.4 kg (06/06 0502) Physical Examination: CONSTITUTIONAL: Well-developed, well-nourished female in no acute distress.  HENT:  Normocephalic, atraumatic, External right and left ear normal. Oropharynx is clear and moist EYES: Conjunctivae and EOM are normal.  NECK: Normal range of motion, supple, no masses SKIN: Skin is warm and dry. No rash noted. Not diaphoretic. No erythema. No pallor. NWilliams Alert and oriented to person, place, and time. Normal reflexes, muscle tone coordination. No cranial nerve deficit noted. PSYCHIATRIC: Normal mood and affect. Normal behavior. Normal judgment and thought content. CARDIOVASCULAR: Normal heart rate noted, regular rhythm RESPIRATORY: Effort and breath sounds normal, no problems with respiration noted MUSCULOSKELETAL: Normal range  of motion. No edema and no tenderness. 2+ distal pulses. ABDOMEN: Soft, nontender, nondistended, gravid. CERVIX:  deferred FHT : 142 Significant Diagnostic Studies:  Results for orders placed or performed during the hospital encounter of 10/03/21 (from the past 168 hour(s))  CBC with Differential/Platelet   Collection Time: 10/03/21 10:06 AM  Result Value Ref Range   WBC 9.4 4.0 - 10.5 K/uL   RBC 4.92 3.87 - 5.11 MIL/uL   Hemoglobin 12.4 12.0 - 15.0 g/dL   HCT 36.8 36.0 - 46.0 %   MCV 74.8 (L) 80.0 - 100.0 fL   MCH 25.2 (L) 26.0 - 34.0 pg   MCHC 33.7 30.0 - 36.0 g/dL   RDW 19.4 (H) 11.5 - 15.5 %   Platelets 301 150 - 400 K/uL   nRBC 0.0 0.0 - 0.2 %   Neutrophils Relative % 72 %   Neutro Abs 6.8 1.7 - 7.7 K/uL   Lymphocytes Relative 18 %   Lymphs Abs 1.7 0.7 - 4.0 K/uL   Monocytes Relative 9 %   Monocytes Absolute 0.8 0.1 - 1.0 K/uL   Eosinophils Relative 1 %   Eosinophils Absolute 0.1 0.0 - 0.5 K/uL   Basophils Relative 0 %   Basophils Absolute 0.0 0.0 - 0.1 K/uL   Immature Granulocytes 0 %   Abs Immature Granulocytes 0.03 0.00 - 0.07 K/uL  Comprehensive metabolic panel   Collection Time: 10/03/21 10:06 AM  Result Value Ref Range   Sodium 132 (L) 135 - 145 mmol/L   Potassium 2.8 (L) 3.5 - 5.1 mmol/L   Chloride 97 (L) 98 - 111 mmol/L   CO2 23 22 - 32 mmol/L   Glucose, Bld 111 (H) 70 - 99 mg/dL   BUN <5 (L) 6 -  20 mg/dL   Creatinine, Ser 0.94 0.44 - 1.00 mg/dL   Calcium 9.2 8.9 - 10.3 mg/dL   Total Protein 7.1 6.5 - 8.1 g/dL   Albumin 2.8 (L) 3.5 - 5.0 g/dL   AST 61 (H) 15 - 41 U/L   ALT 106 (H) 0 - 44 U/L   Alkaline Phosphatase 63 38 - 126 U/L   Total Bilirubin 1.1 0.3 - 1.2 mg/dL   GFR, Estimated >60 >60 mL/min   Anion gap 12 5 - 15  TSH   Collection Time: 10/03/21 10:06 AM  Result Value Ref Range   TSH <0.010 (L) 0.350 - 4.500 uIU/mL  T4, free   Collection Time: 10/03/21 10:06 AM  Result Value Ref Range   Free T4 1.20 (H) 0.61 - 1.12 ng/dL  Urinalysis,  Routine w reflex microscopic Urine, Clean Catch   Collection Time: 10/03/21  5:05 PM  Result Value Ref Range   Color, Urine AMBER (A) YELLOW   APPearance HAZY (A) CLEAR   Specific Gravity, Urine 1.013 1.005 - 1.030   pH 6.0 5.0 - 8.0   Glucose, UA NEGATIVE NEGATIVE mg/dL   Hgb urine dipstick NEGATIVE NEGATIVE   Bilirubin Urine NEGATIVE NEGATIVE   Ketones, ur 80 (A) NEGATIVE mg/dL   Protein, ur NEGATIVE NEGATIVE mg/dL   Nitrite NEGATIVE NEGATIVE   Leukocytes,Ua TRACE (A) NEGATIVE   RBC / HPF 0-5 0 - 5 RBC/hpf   WBC, UA 0-5 0 - 5 WBC/hpf   Bacteria, UA RARE (A) NONE SEEN   Squamous Epithelial / LPF 0-5 0 - 5   Mucus PRESENT    Hyaline Casts, UA PRESENT   Basic metabolic panel   Collection Time: 10/04/21  4:23 AM  Result Value Ref Range   Sodium 135 135 - 145 mmol/L   Potassium 3.0 (L) 3.5 - 5.1 mmol/L   Chloride 105 98 - 111 mmol/L   CO2 23 22 - 32 mmol/L   Glucose, Bld 93 70 - 99 mg/dL   BUN <5 (L) 6 - 20 mg/dL   Creatinine, Ser 0.72 0.44 - 1.00 mg/dL   Calcium 8.0 (L) 8.9 - 10.3 mg/dL   GFR, Estimated >60 >60 mL/min   Anion gap 7 5 - 15  Comprehensive metabolic panel   Collection Time: 10/05/21  4:32 AM  Result Value Ref Range   Sodium 136 135 - 145 mmol/L   Potassium 3.3 (L) 3.5 - 5.1 mmol/L   Chloride 109 98 - 111 mmol/L   CO2 20 (L) 22 - 32 mmol/L   Glucose, Bld 86 70 - 99 mg/dL   BUN <5 (L) 6 - 20 mg/dL   Creatinine, Ser 0.80 0.44 - 1.00 mg/dL   Calcium 8.2 (L) 8.9 - 10.3 mg/dL   Total Protein 5.2 (L) 6.5 - 8.1 g/dL   Albumin 2.2 (L) 3.5 - 5.0 g/dL   AST 50 (H) 15 - 41 U/L   ALT 67 (H) 0 - 44 U/L   Alkaline Phosphatase 39 38 - 126 U/L   Total Bilirubin 0.4 0.3 - 1.2 mg/dL   GFR, Estimated >60 >60 mL/min   Anion gap 7 5 - 15   [place] MFM OB 11-14 WEEK ANATOMY  Result Date: 09/10/2021 ----------------------------------------------------------------------  OBSTETRICS REPORT                       (Signed Final 09/10/2021 12:37 pm)  ---------------------------------------------------------------------- Patient Info  ID #:       [id number]  D.O.B.:  1990/10/26 (30 yrs)  Name:       Becky Torres [facility]                 Visit Date: 09/10/2021 11:14 am ---------------------------------------------------------------------- Performed By  Attending:        Tama High MD        Ref. Address:     [facility]                                                             [place]  Performed By:     Germain Osgood            [facility]                                     [facility]                                                             [place] [facility]                                                             [facility]  Referred By:      Griffin Basil MD ---------------------------------------------------------------------- Orders  #  Description                           Code        Ordered By  1  [place] MFM OB 11-14 WEEK                  [place] ----------------------------------------------------------------------  #  Order #                     Accession #                Episode #  1  [id number]                   [id number]                 [id number] ----------------------------------------------------------------------  Indications  High Risk NIPS (Low Fetal Fraction)  History of sickle cell trait                   Z86.2  Hypertension - Chronic/Pre-existing            O10.019  Cholelithiasis affecting pregnancy,            O26.619, K80.20  antepartum  Medical complication of pregnancy              O26.90  (Increased LFT's, AKI)  [redacted] weeks gestation of pregnancy                Z3A.12 ---------------------------------------------------------------------- Fetal Evaluation  Num Of Fetuses:         1  Fetal Heart Rate(bpm):  140  Cardiac Activity:       Observed  Presentation:            Cephalic  Placenta:               Posterior  P. Cord Insertion:      Not well visualized  Amniotic Fluid  AFI FV:      Within normal limits ---------------------------------------------------------------------- Biometry  CRL:      63.3  mm     G. Age:  12w 4d                  EDD:   03/21/22 ---------------------------------------------------------------------- OB History  Gravidity:    5         Term:   3  TOP:          1        Living:  3 ---------------------------------------------------------------------- Gestational Age  LMP:           13w 3d        Date:  06/08/21                 EDD:   03/15/22  Best:          12w 3d     Det. By:  Loman Chroman         EDD:   03/22/22                                      (07/30/21) ---------------------------------------------------------------------- Anatomy  Cranium:               Visualized             Bladder:                Visualized  Choroid Plexus:        Visualized             Spine:                  Visualized  Heart:                 Visualized             Upper Extremities:      Visualized  Diaphragm:             Visualized             Lower Extremities:      Visualized  Stomach:               Visualized ---------------------------------------------------------------------- Cervix Uterus Adnexa  Cervix  Normal appearance by transabdominal scan.  Uterus  No abnormality visualized.  Right Ovary  Not visualized.  Left Ovary  Not visualized.  Cul De Sac  No free fluid seen.  Adnexa  No adnexal mass visualized. ---------------------------------------------------------------------- Impression  G5 P3013. Patient is here for first-trimester anatomy scan.  On cell-free fetal DNA screening, the risks for trisomies 18  and 13, and triploidy were increased because of low fetal  fraction. She has sickle-cell trait and her partner's carrier  status is not known.  The couple met with our genetic counselor today. She is  undecided about chorionic villus sampling or  repeat blood  draw. She will communicate her decision to our genetic  counselor.  Patient was discharged last week after being admitted for  severe hyperemesis gravidarum and acute kidney injury that  resolved. Increased ALT is present (decreased from previous  finding).  She has chronic hypertension and is not taking  antihypertensives.  Blood pressures today at our office were 140/109 and 153/94  mm Hg with no symptoms.  On ultrasound, the CRL measurement is consistent with her  previously-established dates and good fetal heart activity is  seen. Fetal anatomy that could be ascertained at this  gestational age is normal.  I reassured the patient of the findings.  I discussed chorionic  villous sampling (CVS) procedure and its possible  complication of miscarriage (1 and 500 procedures).  Patient  is aware that CVS can find out if the fetus has sickle cell  disease. ---------------------------------------------------------------------- Recommendations  -An appointment was made for her to return in 7 weeks for  fetal anatomy scan. ----------------------------------------------------------------------                  Tama High, MD Electronically Signed Final Report   09/10/2021 12:37 pm ----------------------------------------------------------------------  US ABDOMEN LIMITED RUQ (LIVER/GB)  Result Date: 09/22/2021 CLINICAL DATA:  Fourteen weeks pregnant with nausea and vomiting. EXAM: ULTRASOUND ABDOMEN LIMITED RIGHT UPPER QUADRANT COMPARISON:  None Available. FINDINGS: Gallbladder: Shadowing echogenic gallstones are seen within the gallbladder lumen. The largest measures 5.0 mm. Gallbladder sludge is also noted. The gallbladder wall is thickened and measures 4.7 mm. No sonographic Murphy sign noted by sonographer. Common bile duct: Diameter: 1.8 mm Liver: No focal lesion identified. Within normal limits in parenchymal echogenicity. Portal vein is patent on color Doppler imaging with normal direction of  blood flow towards the liver. Other: None. IMPRESSION: Cholelithiasis and gallbladder sludge with gallbladder wall thickening. Sequelae associated with acute cholecystitis cannot be excluded. Further evaluation with a nuclear medicine hepatobiliary scan is recommended. Electronically Signed   By: Virgina Norfolk M.D.   On: 09/22/2021 22:04    Future Appointments  Date Time Provider [place]  10/08/2021  9:30 AM Ladene Artist, MD [facility]  10/26/2021 10:15 AM [facility] NURSE [facility]  10/26/2021 10:30 AM [facility] US3 [facility]    Discharge Condition: Stable  Discharge disposition: 01-Home or Self Care       Discharge Instructions     Discharge activity:  No Restrictions   Complete by: As directed    Discharge diet:   Complete by: As directed    Bland diet to start, slowly increase as tolerated   No sexual activity restrictions   Complete by: As directed    Notify physician for a general feeling that "something is not right"   Complete by: As directed    Notify physician for increase or change in vaginal discharge   Complete by: As directed    Notify physician  for intestinal cramps, with or without diarrhea, sometimes described as "gas pain"   Complete by: As directed    Notify physician for leaking of fluid   Complete by: As directed    Notify physician for low, dull backache, unrelieved by heat or Tylenol   Complete by: As directed    Notify physician for menstrual like cramps   Complete by: As directed    Notify physician for pelvic pressure   Complete by: As directed    Notify physician for uterine contractions.  These may be painless and feel like the uterus is tightening or the baby is  "balling up"   Complete by: As directed    Notify physician for vaginal bleeding   Complete by: As directed    PRETERM LABOR:  Includes any of the follwing symptoms that occur between 20 - [redacted] weeks gestation.  If these symptoms are not stopped, preterm labor can  result in preterm delivery, placing your baby at risk   Complete by: As directed       Allergies as of 10/06/2021   No Known Allergies      Medication List     STOP taking these medications    promethazine 25 MG suppository Commonly known as: PHENERGAN Replaced by: promethazine 12.5 MG tablet       TAKE these medications    acetaminophen 325 MG tablet Commonly known as: Tylenol Take 2 tablets (650 mg total) by mouth every 4 (four) hours as needed (for pain scale < 4).   famotidine 20 MG tablet Commonly known as: PEPCID Take 1 tablet (20 mg total) by mouth daily. What changed: when to take this   glycopyrrolate 1 MG tablet Commonly known as: ROBINUL Take 2 tablets (2 mg total) by mouth 3 (three) times daily as needed (spitting). What changed:  medication strength when to take this reasons to take this   labetalol 200 MG tablet Commonly known as: NORMODYNE Take 1 tablet (200 mg total) by mouth every 12 (twelve) hours.   metoCLOPramide 10 MG tablet Commonly known as: REGLAN Take 1 tablet (10 mg total) by mouth every 6 (six) hours.   ondansetron 4 MG disintegrating tablet Commonly known as: ZOFRAN-ODT Take 1 tablet (4 mg total) by mouth every 6 (six) hours as needed for nausea.   One-A-Day Womens Prenatal 1 28-0.8-235 MG Caps Take 1 capsule by mouth daily.   promethazine 12.5 MG tablet Commonly known as: PHENERGAN Take 1-2 tablets (12.5-25 mg total) by mouth every 4 (four) hours as needed for nausea or vomiting. Replaces: promethazine 25 MG suppository   scopolamine 1 MG/3DAYS Commonly known as: TRANSDERM-SCOP Place 1 patch (1.5 mg total) onto the skin every 3 (three) days.        Follow-up [facility] Follow up in 2 week(s).   Specialty: Obstetrics and Gynecology Why: high risk OB follow up any MD Contact information: [place]  [id number] [phone]                Total discharge time: 20 minutes   Signed: Griffin Basil M.D. 10/06/2021, 9:35 AM

## 2021-10-06 NOTE — Plan of Care (Signed)
Patient to be discharged home with printed instructions. No concerns noted. Candise Crabtree L Shahan Starks, RN  

## 2021-10-08 ENCOUNTER — Ambulatory Visit: Payer: Medicaid Other | Admitting: Gastroenterology

## 2021-10-08 ENCOUNTER — Telehealth: Payer: Self-pay | Admitting: Clinical

## 2021-10-08 NOTE — Telephone Encounter (Signed)
Attempt call regarding referral; unable to leave message as "call cannot be completed at this time".

## 2021-10-19 ENCOUNTER — Encounter: Payer: Self-pay | Admitting: Obstetrics and Gynecology

## 2021-10-20 ENCOUNTER — Telehealth: Payer: Self-pay | Admitting: Family Medicine

## 2021-10-20 NOTE — Telephone Encounter (Signed)
Patient said she need her FMLA papers updated, said she was unable to return to work due to being sick and she said she also need a work note for the month of May.

## 2021-10-26 ENCOUNTER — Ambulatory Visit: Payer: Medicaid Other | Attending: Obstetrics and Gynecology

## 2021-10-26 ENCOUNTER — Other Ambulatory Visit: Payer: Self-pay | Admitting: *Deleted

## 2021-10-26 ENCOUNTER — Ambulatory Visit: Payer: Medicaid Other | Admitting: *Deleted

## 2021-10-26 ENCOUNTER — Encounter: Payer: Self-pay | Admitting: *Deleted

## 2021-10-26 VITALS — BP 136/92 | HR 102

## 2021-10-26 DIAGNOSIS — O26612 Liver and biliary tract disorders in pregnancy, second trimester: Secondary | ICD-10-CM

## 2021-10-26 DIAGNOSIS — K8020 Calculus of gallbladder without cholecystitis without obstruction: Secondary | ICD-10-CM | POA: Diagnosis not present

## 2021-10-26 DIAGNOSIS — Z3A19 19 weeks gestation of pregnancy: Secondary | ICD-10-CM | POA: Diagnosis not present

## 2021-10-26 DIAGNOSIS — O0990 Supervision of high risk pregnancy, unspecified, unspecified trimester: Secondary | ICD-10-CM | POA: Diagnosis not present

## 2021-10-26 DIAGNOSIS — O288 Other abnormal findings on antenatal screening of mother: Secondary | ICD-10-CM

## 2021-10-26 DIAGNOSIS — O10012 Pre-existing essential hypertension complicating pregnancy, second trimester: Secondary | ICD-10-CM

## 2021-10-26 DIAGNOSIS — Z362 Encounter for other antenatal screening follow-up: Secondary | ICD-10-CM

## 2021-10-26 DIAGNOSIS — O10919 Unspecified pre-existing hypertension complicating pregnancy, unspecified trimester: Secondary | ICD-10-CM

## 2021-10-28 ENCOUNTER — Other Ambulatory Visit: Payer: Self-pay

## 2021-10-28 ENCOUNTER — Ambulatory Visit (INDEPENDENT_AMBULATORY_CARE_PROVIDER_SITE_OTHER): Payer: Medicaid Other | Admitting: Family Medicine

## 2021-10-28 ENCOUNTER — Encounter: Payer: Self-pay | Admitting: Family Medicine

## 2021-10-28 VITALS — BP 133/92 | HR 142 | Wt 202.0 lb

## 2021-10-28 DIAGNOSIS — Z3A19 19 weeks gestation of pregnancy: Secondary | ICD-10-CM

## 2021-10-28 DIAGNOSIS — O0990 Supervision of high risk pregnancy, unspecified, unspecified trimester: Secondary | ICD-10-CM

## 2021-10-28 DIAGNOSIS — O26611 Liver and biliary tract disorders in pregnancy, first trimester: Secondary | ICD-10-CM

## 2021-10-28 DIAGNOSIS — K8020 Calculus of gallbladder without cholecystitis without obstruction: Secondary | ICD-10-CM

## 2021-10-28 DIAGNOSIS — O10911 Unspecified pre-existing hypertension complicating pregnancy, first trimester: Secondary | ICD-10-CM

## 2021-10-28 DIAGNOSIS — R7989 Other specified abnormal findings of blood chemistry: Secondary | ICD-10-CM | POA: Insufficient documentation

## 2021-10-28 DIAGNOSIS — R1110 Vomiting, unspecified: Secondary | ICD-10-CM

## 2021-10-28 MED ORDER — ASPIRIN 81 MG PO TBEC: 81.0000 mg | DELAYED_RELEASE_TABLET | Freq: Every day | ORAL | 12 refills | Status: AC

## 2021-10-28 NOTE — Progress Notes (Signed)
   Subjective:  Becky Torres is a 31 y.o. P8K9983 at [redacted]w[redacted]d being seen today for ongoing prenatal care.  She is currently monitored for the following issues for this high-risk pregnancy and has Chronic hypertension complicating or reason for care during pregnancy, first trimester; Supervision of high risk pregnancy, antepartum; Sickle cell trait (HCC); Nausea and vomiting during pregnancy; Excessive salivation while pregnant; Hyperemesis; Patient has son with Sickle Cell Disease; UTI in pregnancy; Elevated amylase; Transaminitis; AKI (acute kidney injury) (HCC); Cholelithiasis affecting pregnancy in first trimester, antepartum; Gallbladder sludge; Elevated LFTs; [redacted] weeks gestation of pregnancy; and Decreased thyroid stimulating hormone (TSH) level on their problem list.  Patient reports no complaints.  Contractions: Not present. Vag. Bleeding: None.  Movement: Present. Denies leaking of fluid.   The following portions of the patient's history were reviewed and updated as appropriate: allergies, current medications, past family history, past medical history, past social history, past surgical history and problem list. Problem list updated.  Objective:   Vitals:   10/28/21 1629  BP: (!) 133/92  Pulse: (!) 142  Weight: 202 lb (91.6 kg)    Fetal Status: Fetal Heart Rate (bpm): 140   Movement: Present     General:  Alert, oriented and cooperative. Patient is in no acute distress.  Skin: Skin is warm and dry. No rash noted.   Cardiovascular: Normal heart rate noted  Respiratory: Normal respiratory effort, no problems with respiration noted  Abdomen: Soft, gravid, appropriate for gestational age. Pain/Pressure: Absent     Pelvic: Vag. Bleeding: None     Cervical exam deferred        Extremities: Normal range of motion.     Mental Status: Normal mood and affect. Normal behavior. Normal judgment and thought content.   Urinalysis:      Assessment and Plan:  Pregnancy: J8S5053 at [redacted]w[redacted]d  1.  Supervision of high risk pregnancy, antepartum BP mild range, see below FHR normal Redraw of panorama done today as well as AFP - Panorama Prenatal Test Full Panel - AFP, Serum, Open Spina Bifida  2. Chronic hypertension complicating or reason for care during pregnancy, first trimester Not yet on ASA, start today Taking labetalol once a day, encouraged to do twice a day and we'll see how BP does with that  3. Hyperemesis Feels much, much better Not really taking any meds regularly, using zofran PRN  4. Cholelithiasis affecting pregnancy in first trimester, antepartum No issues recently  5. Decreased thyroid stimulating hormone (TSH) level Likely secondary to hyperemesis, free T4 was only mildly elevated, will check again today as well as TPO Ab's  Preterm labor symptoms and general obstetric precautions including but not limited to vaginal bleeding, contractions, leaking of fluid and fetal movement were reviewed in detail with the patient. Please refer to After Visit Summary for other counseling recommendations.  Return in 4 weeks (on 11/25/2021) for [facility], ob visit, needs MD.   Venora Maples, MD

## 2021-10-28 NOTE — Patient Instructions (Signed)

## 2021-10-29 ENCOUNTER — Encounter: Payer: Self-pay | Admitting: Family Medicine

## 2021-10-31 DIAGNOSIS — Z419 Encounter for procedure for purposes other than remedying health state, unspecified: Secondary | ICD-10-CM | POA: Diagnosis not present

## 2021-11-05 ENCOUNTER — Encounter: Payer: Self-pay | Admitting: Radiology

## 2021-11-06 ENCOUNTER — Encounter: Payer: Self-pay | Admitting: Radiology

## 2021-11-14 LAB — AFP, SERUM, OPEN SPINA BIFIDA
AFP MoM: 1.22
AFP Value: 58.7 ng/mL
Gest. Age on Collection Date: 19.2 weeks
Maternal Age At EDD: 31.5 yr
OSBR Risk 1 IN: 10000
Test Results:: NEGATIVE
Weight: 202 [lb_av]

## 2021-11-14 LAB — THYROID PEROXIDASE ANTIBODY

## 2021-11-14 LAB — TSH+FREE T4

## 2021-11-18 DIAGNOSIS — O368390 Maternal care for abnormalities of the fetal heart rate or rhythm, unspecified trimester, not applicable or unspecified: Secondary | ICD-10-CM | POA: Diagnosis not present

## 2021-11-18 DIAGNOSIS — Z3A22 22 weeks gestation of pregnancy: Secondary | ICD-10-CM | POA: Diagnosis not present

## 2021-11-23 ENCOUNTER — Ambulatory Visit: Payer: Medicaid Other | Admitting: *Deleted

## 2021-11-23 ENCOUNTER — Other Ambulatory Visit: Payer: Self-pay | Admitting: *Deleted

## 2021-11-23 ENCOUNTER — Ambulatory Visit: Payer: Medicaid Other | Attending: Maternal & Fetal Medicine

## 2021-11-23 VITALS — BP 135/88 | HR 89

## 2021-11-23 DIAGNOSIS — O10919 Unspecified pre-existing hypertension complicating pregnancy, unspecified trimester: Secondary | ICD-10-CM

## 2021-11-23 DIAGNOSIS — K8020 Calculus of gallbladder without cholecystitis without obstruction: Secondary | ICD-10-CM

## 2021-11-23 DIAGNOSIS — O99612 Diseases of the digestive system complicating pregnancy, second trimester: Secondary | ICD-10-CM | POA: Diagnosis not present

## 2021-11-23 DIAGNOSIS — Z362 Encounter for other antenatal screening follow-up: Secondary | ICD-10-CM

## 2021-11-23 DIAGNOSIS — O99891 Other specified diseases and conditions complicating pregnancy: Secondary | ICD-10-CM | POA: Diagnosis not present

## 2021-11-23 DIAGNOSIS — O0990 Supervision of high risk pregnancy, unspecified, unspecified trimester: Secondary | ICD-10-CM

## 2021-11-23 DIAGNOSIS — Z3A23 23 weeks gestation of pregnancy: Secondary | ICD-10-CM

## 2021-11-23 DIAGNOSIS — O10012 Pre-existing essential hypertension complicating pregnancy, second trimester: Secondary | ICD-10-CM | POA: Diagnosis not present

## 2021-11-23 DIAGNOSIS — N179 Acute kidney failure, unspecified: Secondary | ICD-10-CM | POA: Diagnosis not present

## 2021-11-23 DIAGNOSIS — R945 Abnormal results of liver function studies: Secondary | ICD-10-CM | POA: Diagnosis not present

## 2021-11-23 DIAGNOSIS — O99212 Obesity complicating pregnancy, second trimester: Secondary | ICD-10-CM | POA: Diagnosis not present

## 2021-11-23 DIAGNOSIS — E669 Obesity, unspecified: Secondary | ICD-10-CM

## 2021-11-26 ENCOUNTER — Ambulatory Visit (INDEPENDENT_AMBULATORY_CARE_PROVIDER_SITE_OTHER): Payer: Medicaid Other | Admitting: Obstetrics and Gynecology

## 2021-11-26 ENCOUNTER — Other Ambulatory Visit: Payer: Self-pay

## 2021-11-26 VITALS — BP 130/91 | HR 92 | Wt 204.9 lb

## 2021-11-26 DIAGNOSIS — O0990 Supervision of high risk pregnancy, unspecified, unspecified trimester: Secondary | ICD-10-CM

## 2021-11-26 DIAGNOSIS — D573 Sickle-cell trait: Secondary | ICD-10-CM

## 2021-11-26 DIAGNOSIS — O99619 Diseases of the digestive system complicating pregnancy, unspecified trimester: Secondary | ICD-10-CM

## 2021-11-26 DIAGNOSIS — O10911 Unspecified pre-existing hypertension complicating pregnancy, first trimester: Secondary | ICD-10-CM

## 2021-11-26 DIAGNOSIS — K8020 Calculus of gallbladder without cholecystitis without obstruction: Secondary | ICD-10-CM

## 2021-11-26 DIAGNOSIS — O26611 Liver and biliary tract disorders in pregnancy, first trimester: Secondary | ICD-10-CM

## 2021-11-26 DIAGNOSIS — Z3A23 23 weeks gestation of pregnancy: Secondary | ICD-10-CM

## 2021-11-26 DIAGNOSIS — K117 Disturbances of salivary secretion: Secondary | ICD-10-CM

## 2021-11-26 NOTE — Progress Notes (Signed)
Pt reports being lightheaded more often.

## 2021-11-26 NOTE — Progress Notes (Signed)
   PRENATAL VISIT NOTE  Subjective:  Becky Torres is a 31 y.o. K4M0102 at [redacted]w[redacted]d being seen today for ongoing prenatal care.  She is currently monitored for the following issues for this high-risk pregnancy and has Chronic hypertension complicating or reason for care during pregnancy, first trimester; Supervision of high risk pregnancy, antepartum; Sickle cell trait (HCC); Nausea and vomiting during pregnancy; Excessive salivation while pregnant; Hyperemesis; Patient has son with Sickle Cell Disease; UTI in pregnancy; Elevated amylase; Transaminitis; AKI (acute kidney injury) (HCC); Cholelithiasis affecting pregnancy in first trimester, antepartum; Gallbladder sludge; Elevated LFTs; [redacted] weeks gestation of pregnancy; and Decreased thyroid stimulating hormone (TSH) level on their problem list.  Patient doing well with no acute concerns today. She reports  occasional dizziness .  Contractions: Not present. Vag. Bleeding: None.  Movement: Present. Denies leaking of fluid.   The following portions of the patient's history were reviewed and updated as appropriate: allergies, current medications, past family history, past medical history, past social history, past surgical history and problem list. Problem list updated.  Objective:   Vitals:   11/26/21 1550 11/26/21 1557  BP: (!) 138/93 (!) 130/91  Pulse: 76 92  Weight: 204 lb 14.4 oz (92.9 kg)     Fetal Status: Fetal Heart Rate (bpm): 140 Fundal Height: 23 cm Movement: Present     General:  Alert, oriented and cooperative. Patient is in no acute distress.  Skin: Skin is warm and dry. No rash noted.   Cardiovascular: Normal heart rate noted  Respiratory: Normal respiratory effort, no problems with respiration noted  Abdomen: Soft, gravid, appropriate for gestational age.  Pain/Pressure: Present     Pelvic: Cervical exam deferred        Extremities: Normal range of motion.  Edema: None  Mental Status:  Normal mood and affect. Normal behavior.  Normal judgment and thought content.   Assessment and Plan:  Pregnancy: G5P3013 at [redacted]w[redacted]d  1. [redacted] weeks gestation of pregnancy   2. Chronic hypertension complicating or reason for care during pregnancy, first trimester Pt notes compliance with baby ASA and labetalol Diastolic mildly elevated Continue scheduled fetal growth scans Fetal testing at 32 weeks  3. Excessive salivation while pregnant Pt states this condition has resolved  4. Cholelithiasis affecting pregnancy in first trimester, antepartum No current issues  5. Supervision of high risk pregnancy, antepartum Continue routine prenatal care  6. Sickle cell trait (HCC)   Preterm labor symptoms and general obstetric precautions including but not limited to vaginal bleeding, contractions, leaking of fluid and fetal movement were reviewed in detail with the patient.  Please refer to After Visit Summary for other counseling recommendations.   Return in about 3 weeks (around 12/17/2021) for [facility], in person.   [name], MD Faculty Attending [facility]

## 2021-11-26 NOTE — Progress Notes (Deleted)
   PRENATAL VISIT NOTE  Subjective:  Becky Torres is a 31 y.o. [id number] at [redacted]w[redacted]d being seen today for ongoing prenatal care.  She is currently monitored for the following issues for this {Blank single:19197::"high-risk","low-risk"} pregnancy and has Chronic hypertension complicating or reason for care during pregnancy, first trimester; Supervision of high risk pregnancy, antepartum; Sickle cell trait (HCC); Nausea and vomiting during pregnancy; Excessive salivation while pregnant; Hyperemesis; Patient has son with Sickle Cell Disease; UTI in pregnancy; Elevated amylase; Transaminitis; AKI (acute kidney injury) (HCC); Cholelithiasis affecting pregnancy in first trimester, antepartum; Gallbladder sludge; Elevated LFTs; [redacted] weeks gestation of pregnancy; and Decreased thyroid stimulating hormone (TSH) level on their problem list.  Patient doing ***well with no acute concerns today. She reports {sx:14538}.  Contractions: Not present. Vag. Bleeding: None.  Movement: Present. ***Denies leaking of fluid.   The following portions of the patient's history were reviewed and updated as appropriate: allergies, current medications, past family history, past medical history, past social history, past surgical history and problem list. Problem list updated.  Objective:   Vitals:   11/26/21 1550 11/26/21 1557  BP: (!) 138/93 (!) 130/91  Pulse: 76 92  Weight: 204 lb 14.4 oz (92.9 kg)     Fetal Status: Fetal Heart Rate (bpm): 140 Fundal Height: 23 cm Movement: Present     General:  Alert, oriented and cooperative. Patient is in no acute distress.  Skin: Skin is warm and dry. No rash noted.   Cardiovascular: Normal heart rate noted  Respiratory: Normal respiratory effort, no problems with respiration noted  Abdomen: Soft, ***gravid, appropriate for gestational age.  Pain/Pressure: Present     Pelvic: {Blank single:19197::"Cervical exam performed","Cervical exam deferred"}        Extremities: Normal range of  motion.  Edema: None  Mental Status:  Normal mood and affect. Normal behavior. Normal judgment and thought content.   Assessment and Plan:  Pregnancy: G5P3013 at [redacted]w[redacted]d  1. [redacted] weeks gestation of pregnancy ***  2. Chronic hypertension complicating or reason for care during pregnancy, first trimester ***  3. Excessive salivation while pregnant ***  4. Cholelithiasis affecting pregnancy in first trimester, antepartum ***  5. Supervision of high risk pregnancy, antepartum ***  6. Sickle cell trait (HCC) ***  {Blank single:19197::"Term","Preterm"} labor symptoms and general obstetric precautions including but not limited to vaginal bleeding, contractions, leaking of fluid and fetal movement were reviewed in detail with the patient.  Please refer to After Visit Summary for other counseling recommendations.   Return in about 3 weeks (around 12/17/2021) for [facility], in person.   [name], MD Faculty Attending Center for [facility]

## 2021-12-01 DIAGNOSIS — Z419 Encounter for procedure for purposes other than remedying health state, unspecified: Secondary | ICD-10-CM | POA: Diagnosis not present

## 2021-12-21 ENCOUNTER — Encounter: Payer: Medicaid Other | Admitting: Family Medicine

## 2021-12-22 ENCOUNTER — Ambulatory Visit: Payer: Medicaid Other | Admitting: *Deleted

## 2021-12-22 ENCOUNTER — Ambulatory Visit: Payer: Medicaid Other | Attending: Obstetrics

## 2021-12-22 VITALS — BP 130/84 | HR 114

## 2021-12-22 DIAGNOSIS — O0990 Supervision of high risk pregnancy, unspecified, unspecified trimester: Secondary | ICD-10-CM

## 2021-12-22 DIAGNOSIS — R7401 Elevation of levels of liver transaminase levels: Secondary | ICD-10-CM

## 2021-12-22 DIAGNOSIS — O10012 Pre-existing essential hypertension complicating pregnancy, second trimester: Secondary | ICD-10-CM

## 2021-12-22 DIAGNOSIS — E669 Obesity, unspecified: Secondary | ICD-10-CM | POA: Diagnosis not present

## 2021-12-22 DIAGNOSIS — O285 Abnormal chromosomal and genetic finding on antenatal screening of mother: Secondary | ICD-10-CM

## 2021-12-22 DIAGNOSIS — O368320 Maternal care for abnormalities of the fetal heart rate or rhythm, second trimester, not applicable or unspecified: Secondary | ICD-10-CM | POA: Diagnosis not present

## 2021-12-22 DIAGNOSIS — D573 Sickle-cell trait: Secondary | ICD-10-CM

## 2021-12-22 DIAGNOSIS — N179 Acute kidney failure, unspecified: Secondary | ICD-10-CM | POA: Diagnosis not present

## 2021-12-22 DIAGNOSIS — Z362 Encounter for other antenatal screening follow-up: Secondary | ICD-10-CM | POA: Diagnosis not present

## 2021-12-22 DIAGNOSIS — O26612 Liver and biliary tract disorders in pregnancy, second trimester: Secondary | ICD-10-CM

## 2021-12-22 DIAGNOSIS — O99212 Obesity complicating pregnancy, second trimester: Secondary | ICD-10-CM | POA: Diagnosis not present

## 2021-12-22 DIAGNOSIS — Z3A27 27 weeks gestation of pregnancy: Secondary | ICD-10-CM

## 2021-12-22 DIAGNOSIS — K8020 Calculus of gallbladder without cholecystitis without obstruction: Secondary | ICD-10-CM | POA: Diagnosis not present

## 2021-12-22 DIAGNOSIS — O10919 Unspecified pre-existing hypertension complicating pregnancy, unspecified trimester: Secondary | ICD-10-CM | POA: Diagnosis not present

## 2021-12-23 ENCOUNTER — Telehealth: Payer: Self-pay

## 2021-12-23 ENCOUNTER — Ambulatory Visit (INDEPENDENT_AMBULATORY_CARE_PROVIDER_SITE_OTHER): Payer: Medicaid Other | Admitting: Family Medicine

## 2021-12-23 ENCOUNTER — Other Ambulatory Visit: Payer: Self-pay | Admitting: *Deleted

## 2021-12-23 ENCOUNTER — Encounter: Payer: Self-pay | Admitting: Family Medicine

## 2021-12-23 ENCOUNTER — Other Ambulatory Visit: Payer: Self-pay

## 2021-12-23 VITALS — BP 125/86 | HR 105 | Wt 205.0 lb

## 2021-12-23 DIAGNOSIS — O0990 Supervision of high risk pregnancy, unspecified, unspecified trimester: Secondary | ICD-10-CM

## 2021-12-23 DIAGNOSIS — O10912 Unspecified pre-existing hypertension complicating pregnancy, second trimester: Secondary | ICD-10-CM

## 2021-12-23 DIAGNOSIS — Z23 Encounter for immunization: Secondary | ICD-10-CM | POA: Diagnosis not present

## 2021-12-23 DIAGNOSIS — O10911 Unspecified pre-existing hypertension complicating pregnancy, first trimester: Secondary | ICD-10-CM

## 2021-12-23 DIAGNOSIS — Z3A27 27 weeks gestation of pregnancy: Secondary | ICD-10-CM

## 2021-12-23 DIAGNOSIS — O0992 Supervision of high risk pregnancy, unspecified, second trimester: Secondary | ICD-10-CM

## 2021-12-23 DIAGNOSIS — R7989 Other specified abnormal findings of blood chemistry: Secondary | ICD-10-CM

## 2021-12-23 NOTE — Progress Notes (Signed)
   Subjective:  Becky Torres is a 31 y.o. F8H8299 at [redacted]w[redacted]d being seen today for ongoing prenatal care.  She is currently monitored for the following issues for this high-risk pregnancy and has Chronic hypertension complicating or reason for care during pregnancy, first trimester; Supervision of high risk pregnancy, antepartum; Sickle cell trait (HCC); Nausea and vomiting during pregnancy; Excessive salivation while pregnant; Hyperemesis; Patient has son with Sickle Cell Disease; UTI in pregnancy; Elevated amylase; Transaminitis; AKI (acute kidney injury) (HCC); Cholelithiasis affecting pregnancy in first trimester, antepartum; Gallbladder sludge; Elevated LFTs; [redacted] weeks gestation of pregnancy; and Decreased thyroid stimulating hormone (TSH) level on their problem list.  Patient reports no complaints.  Contractions: Not present.  .  Movement: Present. Denies leaking of fluid.   The following portions of the patient's history were reviewed and updated as appropriate: allergies, current medications, past family history, past medical history, past social history, past surgical history and problem list. Problem list updated.  Objective:   Vitals:   12/23/21 1329  BP: 125/86  Pulse: (!) 105  Weight: 205 lb (93 kg)    Fetal Status: Fetal Heart Rate (bpm): 149   Movement: Present     General:  Alert, oriented and cooperative. Patient is in no acute distress.  Skin: Skin is warm and dry. No rash noted.   Cardiovascular: Normal heart rate noted  Respiratory: Normal respiratory effort, no problems with respiration noted  Abdomen: Soft, gravid, appropriate for gestational age. Pain/Pressure: Absent     Pelvic:       Cervical exam deferred        Extremities: Normal range of motion.     Mental Status: Normal mood and affect. Normal behavior. Normal judgment and thought content.   Urinalysis:      Assessment and Plan:  Pregnancy: G5P3013 at [redacted]w[redacted]d  1. Supervision of high risk pregnancy,  antepartum BP and FHR normal Accepts TDAP today Discussed need for third trimester labs, will schedule for next visit Would like BTL, consent signed - Tdap vaccine greater than or equal to 7yo IM  2. Chronic hypertension complicating or reason for care during pregnancy, first trimester Prescribed ASA and labetalol 200 BID Doing well Start antenatal testing at 32 weeks Following w MFM, last growth [place] normal  3. Decreased thyroid stimulating hormone (TSH) level Plan to recheck TSH and free T4 at time of third trimester labs  Preterm labor symptoms and general obstetric precautions including but not limited to vaginal bleeding, contractions, leaking of fluid and fetal movement were reviewed in detail with the patient. Please refer to After Visit Summary for other counseling recommendations.  Return in 2 weeks (on 01/06/2022) for [facility], ob visit.   Venora Maples, MD

## 2021-12-23 NOTE — Patient Instructions (Signed)

## 2021-12-23 NOTE — Patient Outreach (Signed)
Care Coordination  12/23/2021  Becky Torres 07/22/1990 [id number]  BSW completed a telephone outreach to offer patients MM services. Patient states no resources are needed at this time, but would like to accept services. BSW scheduled patient for BSW to check in and [facility] 01/27/22.   Gus Puma, BSW, [facility]    High Risk Managed Medicaid Team  [phone]

## 2021-12-23 NOTE — Patient Instructions (Signed)
Visit Information  Becky Torres was given information about Medicaid Managed Care team care coordination services as a part of their [facility] Medicaid benefit. Becky Torres verbally consented to engagement with the [facility] Managed Care team.   If you are experiencing a medical emergency, please call 911 or report to your local emergency department or urgent care.   If you have a non-emergency medical problem during routine business hours, please contact your provider's office and ask to speak with a nurse.   For questions related to your [facility] health plan, please call: [phone] or go here:[email or web address]  If you would like to schedule transportation through your [facility] plan, please call the following number at least 2 days in advance of your appointment: [phone].  You can also use the MTM portal or MTM mobile app to manage your rides. For the portal, please go to [email or web address]  Call the [facility] Crisis Line at [phone], at any time, 24 hours a day, 7 days a week. If you are in danger or need immediate medical attention call 911.  If you would like help to quit smoking, call 1-800-QUIT-NOW (502-592-9816) OR Espaol: 1-855-Djelo-Ya (1-093-235-5732) o para ms informacin haga clic aqu or Text READY to 202-542 to register via text  Becky Torres - following are the goals we discussed in your visit today:   Goals Addressed   None       Social Worker will follow up in 30 days .   Gus Puma, BSW, [facility]  [place]  High Risk Managed Medicaid Team  [phone]   Following is a copy of your plan of care:  There are no care plans that you recently modified to display for this patient.

## 2021-12-23 NOTE — Progress Notes (Signed)
Patient stated that she has been getting light headed more frequent

## 2021-12-23 NOTE — Progress Notes (Signed)
Tdap vaccine administered into left deltoid without any complications 

## 2021-12-24 ENCOUNTER — Encounter: Payer: Self-pay | Admitting: *Deleted

## 2022-01-01 DIAGNOSIS — Z419 Encounter for procedure for purposes other than remedying health state, unspecified: Secondary | ICD-10-CM | POA: Diagnosis not present

## 2022-01-05 ENCOUNTER — Ambulatory Visit: Payer: Medicaid Other

## 2022-01-14 ENCOUNTER — Encounter: Payer: Self-pay | Admitting: Obstetrics and Gynecology

## 2022-01-15 ENCOUNTER — Other Ambulatory Visit: Payer: Self-pay

## 2022-01-15 ENCOUNTER — Other Ambulatory Visit: Payer: Medicaid Other

## 2022-01-15 ENCOUNTER — Ambulatory Visit (INDEPENDENT_AMBULATORY_CARE_PROVIDER_SITE_OTHER): Payer: Medicaid Other | Admitting: Family Medicine

## 2022-01-15 VITALS — BP 134/94 | HR 114 | Wt 206.5 lb

## 2022-01-15 DIAGNOSIS — O0990 Supervision of high risk pregnancy, unspecified, unspecified trimester: Secondary | ICD-10-CM

## 2022-01-15 DIAGNOSIS — O10911 Unspecified pre-existing hypertension complicating pregnancy, first trimester: Secondary | ICD-10-CM

## 2022-01-15 NOTE — Progress Notes (Signed)
   PRENATAL VISIT NOTE  Subjective:  Becky Torres is a 31 y.o. N5A2130 at [redacted]w[redacted]d being seen today for ongoing prenatal care.  She is currently monitored for the following issues for this high-risk pregnancy and has Chronic hypertension complicating or reason for care during pregnancy, first trimester; Supervision of high risk pregnancy, antepartum; Sickle cell trait (HCC); Patient has son with Sickle Cell Disease; UTI in pregnancy; and Cholelithiasis affecting pregnancy in first trimester, antepartum on their problem list.  Patient reports no complaints. Had some BH contractions yesterday and these have resolved.  Contractions: Irritability. Vag. Bleeding: None.  Movement: Present. Denies leaking of fluid.   The following portions of the patient's history were reviewed and updated as appropriate: allergies, current medications, past family history, past medical history, past social history, past surgical history and problem list.   Objective:   Vitals:   01/15/22 0847  BP: (!) 134/94  Pulse: (!) 114  Weight: 206 lb 8 oz (93.7 kg)    Fetal Status: Fetal Heart Rate (bpm): 144 Fundal Height: 31 cm Movement: Present     General:  Alert, oriented and cooperative. Patient is in no acute distress.  Skin: Skin is warm and dry. No rash noted.   Cardiovascular: Normal heart rate noted  Respiratory: Normal respiratory effort, no problems with respiration noted  Abdomen: Soft, gravid, appropriate for gestational age.  Pain/Pressure: Present     Pelvic: Cervical exam deferred        Extremities: Normal range of motion.  Edema: Trace  Mental Status: Normal mood and affect. Normal behavior. Normal judgment and thought content.   Assessment and Plan:  Pregnancy: Q6V7846 at [redacted]w[redacted]d 1. Chronic hypertension complicating or reason for care during pregnancy, third trimester On labetalol and ASA, BP is well cotnrolled, did not take her am labetalol since she was fasting today Last growth at 65%. To  begin testing @ 32 weeks and f/u u/s for growth next week.  2. Supervision of high risk pregnancy, antepartum 28 wk labs today S/p TDaP Declined flu  Preterm labor symptoms and general obstetric precautions including but not limited to vaginal bleeding, contractions, leaking of fluid and fetal movement were reviewed in detail with the patient. Please refer to After Visit Summary for other counseling recommendations.   Return in 2 weeks (on 01/29/2022) for [facility], OB visit and BPP weekly starting in 3 weeks--has BPP in MFM on 9/26.  Future Appointments  Date Time Provider Department Center  01/15/2022 10:55 AM Reva Bores, MD [facility]  01/19/2022 10:15 AM [facility] NURSE [facility]  01/19/2022 10:30 AM [facility] US3 [facility]  01/25/2022  3:00 PM THN CCC-MM SOCIAL WORKER 2 THN-CCC None  01/26/2022  3:30 PM [facility] NURSE [facility]  01/26/2022  3:45 PM [facility] US4 [facility]  01/27/2022  3:00 PM THN CCC-MM CARE MANAGER THN-CCC None  01/29/2022 10:35 AM Milas Hock, MD [facility]  02/11/2022 10:55 AM Kathlene Cote [facility]  02/26/2022  9:35 AM Milas Hock, MD [facility]    Reva Bores, MD

## 2022-01-16 LAB — GLUCOSE TOLERANCE, 2 HOURS W/ 1HR
Glucose, 1 hour: 123 mg/dL (ref 70–179)
Glucose, 2 hour: 105 mg/dL (ref 70–152)
Glucose, Fasting: 76 mg/dL (ref 70–91)

## 2022-01-16 LAB — RPR: RPR Ser Ql: NONREACTIVE

## 2022-01-16 LAB — CBC
Hematocrit: 29.6 % — ABNORMAL LOW (ref 34.0–46.6)
Hemoglobin: 8.9 g/dL — ABNORMAL LOW (ref 11.1–15.9)
MCH: 21.5 pg — ABNORMAL LOW (ref 26.6–33.0)
MCHC: 30.1 g/dL — ABNORMAL LOW (ref 31.5–35.7)
MCV: 72 fL — ABNORMAL LOW (ref 79–97)
Platelets: 172 10*3/uL (ref 150–450)
RBC: 4.14 x10E6/uL (ref 3.77–5.28)
RDW: 16.4 % — ABNORMAL HIGH (ref 11.7–15.4)
WBC: 9.9 10*3/uL (ref 3.4–10.8)

## 2022-01-16 LAB — HIV ANTIBODY (ROUTINE TESTING W REFLEX): HIV Screen 4th Generation wRfx: NONREACTIVE

## 2022-01-18 ENCOUNTER — Other Ambulatory Visit: Payer: Self-pay | Admitting: Family Medicine

## 2022-01-18 DIAGNOSIS — O99013 Anemia complicating pregnancy, third trimester: Secondary | ICD-10-CM | POA: Insufficient documentation

## 2022-01-18 DIAGNOSIS — O0990 Supervision of high risk pregnancy, unspecified, unspecified trimester: Secondary | ICD-10-CM

## 2022-01-18 MED ORDER — FERROUS SULFATE 325 (65 FE) MG PO TBEC: 325.0000 mg | DELAYED_RELEASE_TABLET | ORAL | 2 refills | Status: AC

## 2022-01-19 ENCOUNTER — Ambulatory Visit: Payer: Medicaid Other | Admitting: *Deleted

## 2022-01-19 ENCOUNTER — Encounter: Payer: Self-pay | Admitting: *Deleted

## 2022-01-19 ENCOUNTER — Ambulatory Visit: Payer: Medicaid Other

## 2022-01-19 ENCOUNTER — Other Ambulatory Visit: Payer: Self-pay | Admitting: *Deleted

## 2022-01-19 ENCOUNTER — Ambulatory Visit: Payer: Medicaid Other | Attending: Maternal & Fetal Medicine

## 2022-01-19 VITALS — BP 129/73 | HR 84

## 2022-01-19 DIAGNOSIS — O99213 Obesity complicating pregnancy, third trimester: Secondary | ICD-10-CM

## 2022-01-19 DIAGNOSIS — O0990 Supervision of high risk pregnancy, unspecified, unspecified trimester: Secondary | ICD-10-CM | POA: Insufficient documentation

## 2022-01-19 DIAGNOSIS — N179 Acute kidney failure, unspecified: Secondary | ICD-10-CM

## 2022-01-19 DIAGNOSIS — O26613 Liver and biliary tract disorders in pregnancy, third trimester: Secondary | ICD-10-CM | POA: Diagnosis not present

## 2022-01-19 DIAGNOSIS — O10919 Unspecified pre-existing hypertension complicating pregnancy, unspecified trimester: Secondary | ICD-10-CM | POA: Insufficient documentation

## 2022-01-19 DIAGNOSIS — Z362 Encounter for other antenatal screening follow-up: Secondary | ICD-10-CM | POA: Insufficient documentation

## 2022-01-19 DIAGNOSIS — Z3A31 31 weeks gestation of pregnancy: Secondary | ICD-10-CM

## 2022-01-19 DIAGNOSIS — E669 Obesity, unspecified: Secondary | ICD-10-CM | POA: Diagnosis not present

## 2022-01-19 DIAGNOSIS — R7989 Other specified abnormal findings of blood chemistry: Secondary | ICD-10-CM

## 2022-01-19 DIAGNOSIS — O26833 Pregnancy related renal disease, third trimester: Secondary | ICD-10-CM | POA: Diagnosis not present

## 2022-01-19 DIAGNOSIS — K8020 Calculus of gallbladder without cholecystitis without obstruction: Secondary | ICD-10-CM | POA: Diagnosis not present

## 2022-01-19 DIAGNOSIS — O368390 Maternal care for abnormalities of the fetal heart rate or rhythm, unspecified trimester, not applicable or unspecified: Secondary | ICD-10-CM

## 2022-01-19 DIAGNOSIS — R945 Abnormal results of liver function studies: Secondary | ICD-10-CM

## 2022-01-19 DIAGNOSIS — O10013 Pre-existing essential hypertension complicating pregnancy, third trimester: Secondary | ICD-10-CM | POA: Diagnosis not present

## 2022-01-19 DIAGNOSIS — O10913 Unspecified pre-existing hypertension complicating pregnancy, third trimester: Secondary | ICD-10-CM

## 2022-01-25 ENCOUNTER — Other Ambulatory Visit: Payer: Self-pay

## 2022-01-25 NOTE — Patient Outreach (Signed)
  Medicaid Managed Care Social Work Note  01/25/2022 Name:  Becky Torres MRN:  [id number] DOB:  October 14, 1990  Becky Torres is an 31 y.o. year old female who is a primary patient of Pcp, No.  The Medicaid Managed Care Coordination team was consulted for assistance with:   follow up   Becky Torres was given information about Medicaid Managed Care Coordination team services today. [name] Patient agreed to services and verbal consent obtained.  Engaged with patient  for by telephone forfollow up visit in response to referral for case management and/or care coordination services.   Assessments/Interventions:  Review of past medical history, allergies, medications, health status, including review of consultants reports, laboratory and other test data, was performed as part of comprehensive evaluation and provision of chronic care management services.  SDOH: (Social Determinant of Health) assessments and interventions performed: BSW completed a telephone outreach with patient. She stated everything is going good and no resources are needed at this time. BSW did remind patient of her appointment with RNCM on 9/27 and she is looking forward to speaking with her.   Advanced Directives Status:  Not addressed in this encounter.  Care Plan                 No Known Allergies  Medications Reviewed Today     Reviewed by Baruch Gouty, RN (Registered Nurse) on 01/19/22 at 1010  Med List Status: <None>   Medication Order Taking? Sig Documenting Provider Last Dose Status Informant  aspirin EC 81 MG tablet [id number] Yes Take 1 tablet (81 mg total) by mouth daily. Swallow whole. Clarnce Flock, MD Taking Active   ferrous sulfate 325 (65 FE) MG EC tablet [id number] Yes Take 1 tablet (325 mg total) by mouth every other day. Clarnce Flock, MD Taking Active   labetalol (NORMODYNE) 200 MG tablet [id number] Yes Take 1 tablet (200 mg total) by mouth every 12 (twelve) hours. Seabron Spates, CNM  Taking Active   Prenat-Fe Carbonyl-FA-Omega 3 (ONE-A-DAY WOMENS PRENATAL 1) 28-0.8-235 MG CAPS [id number] Yes Take 1 capsule by mouth daily. [provider] Taking Active             Patient Active Problem List   Diagnosis Date Noted   Anemia of pregnancy in third trimester 01/18/2022   Cholelithiasis affecting pregnancy in first trimester, antepartum 08/30/2021   Patient has son with Sickle Cell Disease 08/29/2021   UTI in pregnancy 08/29/2021   Sickle cell trait ([place])    Supervision of high risk pregnancy, antepartum 08/11/2021   Chronic hypertension complicating or reason for care during pregnancy, first trimester 07/24/2012    Conditions to be addressed/monitored per PCP order:   Follow up   There are no care plans that you recently modified to display for this patient.   Follow up:  Patient agrees to Care Plan and Follow-up.  Plan: The Managed Medicaid care management team will reach out to the patient again over the next 45-60 days.  Date/time of next scheduled Social Work care management/care coordination outreach:  03/22/22  Mickel Fuchs, Arita Miss, [place] Medicaid Team  [phone]

## 2022-01-25 NOTE — Patient Instructions (Signed)
Visit Information  Ms. Rawe was given information about Medicaid Managed Care team care coordination services as a part of their [facility] Medicaid benefit. Mindi Junker Gurka verbally consented to engagement with the [facility] Managed Care team.   If you are experiencing a medical emergency, please call 911 or report to your local emergency department or urgent care.   If you have a non-emergency medical problem during routine business hours, please contact your provider's office and ask to speak with a nurse.   For questions related to your [facility] health plan, please call: [phone] or go here:[email or web address]  If you would like to schedule transportation through your [facility] plan, please call the following number at least 2 days in advance of your appointment: [phone].  You can also use the MTM portal or MTM mobile app to manage your rides. For the portal, please go to [email or web address].  Call the [place] at [phone], at any time, 24 hours a day, 7 days a week. If you are in danger or need immediate medical attention call 911.  If you would like help to quit smoking, call 1-800-QUIT-NOW 7245417588) OR Espaol: 1-855-Djelo-Ya (0-626-948-5462) o para ms informacin haga clic aqu or Text READY to 200-400 to register via text  Ms. Zamarripa - following are the goals we discussed in your visit today:   Goals Addressed   None      Social Worker will follow up in 30-60 days .   Mickel Fuchs, BSW, [place] Managed Medicaid Team  [phone]   Following is a copy of your plan of care:  There are no care plans that you recently modified to display for this patient.

## 2022-01-26 ENCOUNTER — Encounter: Payer: Self-pay | Admitting: *Deleted

## 2022-01-26 ENCOUNTER — Ambulatory Visit: Payer: Medicaid Other | Admitting: *Deleted

## 2022-01-26 ENCOUNTER — Ambulatory Visit: Payer: Medicaid Other | Attending: Maternal & Fetal Medicine

## 2022-01-26 VITALS — BP 137/86 | HR 91

## 2022-01-26 DIAGNOSIS — O0990 Supervision of high risk pregnancy, unspecified, unspecified trimester: Secondary | ICD-10-CM

## 2022-01-26 DIAGNOSIS — O10912 Unspecified pre-existing hypertension complicating pregnancy, second trimester: Secondary | ICD-10-CM | POA: Insufficient documentation

## 2022-01-26 NOTE — Progress Notes (Deleted)
   PRENATAL VISIT NOTE  Subjective:  Becky Torres is a 31 y.o. O2V0350 at 32w***d being seen today for ongoing prenatal care.  She is currently monitored for the following issues for this high-risk pregnancy and has Chronic hypertension complicating or reason for care during pregnancy, first trimester; Supervision of high risk pregnancy, antepartum; Sickle cell trait ([place]); Patient has son with Sickle Cell Disease; UTI in pregnancy; Cholelithiasis affecting pregnancy in first trimester, antepartum; and Anemia of pregnancy in third trimester on their problem list.  Patient reports {sx:14538}.   .  .   . Denies leaking of fluid.   The following portions of the patient's history were reviewed and updated as appropriate: allergies, current medications, past family history, past medical history, past social history, past surgical history and problem list.   Objective:  There were no vitals filed for this visit.  Fetal Status:           General:  Alert, oriented and cooperative. Patient is in no acute distress.  Skin: Skin is warm and dry. No rash noted.   Cardiovascular: Normal heart rate noted  Respiratory: Normal respiratory effort, no problems with respiration noted  Abdomen: Soft, gravid, appropriate for gestational age.        Pelvic: Cervical exam deferred        Extremities: Normal range of motion.     Mental Status: Normal mood and affect. Normal behavior. Normal judgment and thought content.   Assessment and Plan:  Pregnancy: [id number] at 32w***d 1. Supervision of high risk pregnancy, antepartum Declined flu shot Urine TOC from 1st trimester UTI Rh pos  2. Sickle cell trait ([place])  3. Anemia of pregnancy in third trimester HgB 8.9 - started on iron - ***  4. Chronic hypertension complicating or reason for care during pregnancy, first trimester Started antenatal testing Fetal echo wnl Labetalol 200 bid Growth 51%ile, normal afi, cephalic on 9/93  Preterm labor symptoms  and general obstetric precautions including but not limited to vaginal bleeding, contractions, leaking of fluid and fetal movement were reviewed in detail with the patient. Please refer to After Visit Summary for other counseling recommendations.   No follow-ups on file.  Future Appointments  Date Time Provider [place]  01/26/2022  3:30 PM [facility] NURSE [facility]  01/26/2022  3:45 PM W[facility] US4 W[facility]  01/27/2022  3:00 PM THN CCC-MM CARE MANAGER THN-CCC None  01/29/2022 10:55 AM Radene Gunning, MD [facility]  02/02/2022 11:15 AM [facility] NURSE [facility]  02/02/2022 11:30 AM W[facility] US2 [facility]  02/09/2022 11:15 AM W[facility] NURSE WMC-M[facility]  02/09/2022 11:30 AM W[facility]MFC US2 WMC[facility]  02/11/2022 11:15 AM Danielle Rankin [facility]  02/16/2022 11:15 AM W[facility] NURSE [facility]  02/16/2022 11:30 AM W[facility] US2 WMC[facility]  02/23/2022 11:15 AM W[facility] NURSE WMC[facility]  02/23/2022 11:30 AM [facility] US2 [facility]  02/26/2022  9:35 AM Radene Gunning, MD [facility]  03/22/2022 11:00 AM THN CCC-MM SOCIAL WORKER 2 THN-CCC None    Radene Gunning, MD

## 2022-01-27 ENCOUNTER — Other Ambulatory Visit: Payer: Self-pay | Admitting: *Deleted

## 2022-01-27 NOTE — Patient Outreach (Addendum)
Medicaid Managed Care   Nurse Care Manager Note  01/27/2022 Name:  SHALAYNA CRAIL MRN:  [id number] DOB:  Apr 13, 1991  Mindi Junker Flemings is an 31 y.o. year old female who is a primary patient of Pcp, No.  The Medicaid Managed Care Coordination team was consulted for assistance with:    HTN Anemia  Ms. Schouten was given information about Medicaid Managed Care Coordination team services today. [place] Patient agreed to services and verbal consent obtained.  Engaged with patient by telephone for initial visit in response to provider referral for case management and/or care coordination services.   Assessments/Interventions:  Review of past medical history, allergies, medications, health status, including review of consultants reports, laboratory and other test data, was performed as part of comprehensive evaluation and provision of chronic care management services.  SDOH (Social Determinants of Health) assessments and interventions performed:   Care Plan  No Known Allergies  Medications Reviewed Today     Reviewed by Melissa Montane, RN (Registered Nurse) on 01/27/22 at 1522  Med List Status: <None>   Medication Order Taking? Sig Documenting Provider Last Dose Status Informant  aspirin EC 81 MG tablet [id number] Yes Take 1 tablet (81 mg total) by mouth daily. Swallow whole. Clarnce Flock, MD Taking Active   ferrous sulfate 325 (65 FE) MG EC tablet [id number] No Take 1 tablet (325 mg total) by mouth every other day.  Patient not taking: Reported on 01/27/2022   Clarnce Flock, MD Not Taking Active            Med Note Thamas Jaegers, [place] A   Wed Jan 27, 2022  3:22 PM) Patient unaware of this prescription, will pick up this week.  labetalol (NORMODYNE) 200 MG tablet [id number] Yes Take 1 tablet (200 mg total) by mouth every 12 (twelve) hours. Seabron Spates, CNM Taking Active   Prenat-Fe Carbonyl-FA-Omega 3 (ONE-A-DAY WOMENS PRENATAL 1) 28-0.8-235 MG CAPS [id number] Yes Take 1  capsule by mouth daily. [provider] Taking Active             Patient Active Problem List   Diagnosis Date Noted   Anemia of pregnancy in third trimester 01/18/2022   Cholelithiasis affecting pregnancy in first trimester, antepartum 08/30/2021   Patient has son with Sickle Cell Disease 08/29/2021   UTI in pregnancy 08/29/2021   Sickle cell trait ([place])    Supervision of high risk pregnancy, antepartum 08/11/2021   Chronic hypertension complicating or reason for care during pregnancy, first trimester 07/24/2012    Conditions to be addressed/monitored per PCP order:  HTN and Anemia in pregnancy  Care Plan : RN Care Manager Plan of Care  Updates made by Melissa Montane, RN since 01/27/2022 12:00 AM     Problem: Health Mangement needs related to Pregnancy      Long-Range Goal: Development of Plan of Care to address Health Mangement needs related to Pregnancy   Start Date: 01/27/2022  Expected End Date: 04/27/2022  Priority: High  Note:   Current Barriers:  Knowledge Deficits related to plan of care for management of chronic HTN and mild Anemia in pregnancy   RNCM Clinical Goal(s):  Patient will verbalize understanding of plan for management of HTN and Anemia in pregnancy as evidenced by patient reports take all medications exactly as prescribed and will call provider for medication related questions as evidenced by patient reports and EMR documentation    attend all scheduled medical appointments: 01/29/22 with OB Provider, 02/02/22 and  02/09/22 with Maternal Fetal Care as evidenced by provider documentation           Interventions: Inter-disciplinary care team collaboration (see longitudinal plan of care) Evaluation of current treatment plan related to  self management and patient's adherence to plan as established by provider Advised patient to get a maternity belt to help with pelvic pain and pressure   Hypertension in Pregnancy chronic:  (Status: New goal.) Long  Term Goal  Reviewed signs and symptoms of hypertension in pregnancy and advised patient to monitor for and report persistent, worsening, or severe headache, worsening or severe swelling, visual disturbances, or decrease in urination Evaluation of current treatment plan related to hypertension self management and patient's adherence to plan as established by provider;   Reviewed medications with patient and discussed importance of compliance;  Discussed plans with patient for ongoing care management follow up and provided patient with direct contact information for care management team; Advised patient, providing education and rationale, to monitor blood pressure daily and record, calling PCP for findings outside established parameters;  Reviewed scheduled/upcoming provider appointments including: 01/29/22 with OB, 02/02/22 and 02/09/22 with Maternal Fetal Care Provided patient with information on fetal kick count  Pregnancy with Nutritional Factors -  Mild Anemia :  (Status: New goal.) Long Term Goal  Reviewed upcoming provider appointments with patient and advised to attend all OB appointments including ultrasound and antenatal testing reviewed medications and advised adherence to medication regimen advised to follow routine screening guidelines as outlined by obstetrics provider    advised to contact OB provider with concerns or questions  Provided patient with information on iron rich foods Discussed new prescription for oral iron, advised patient to pick up prescription and take as directed-every other day   Patient Goals/Self-Care Activities: Take medications as prescribed   Attend all scheduled provider appointments Call provider office for new concerns or questions  Increase intake of iron rich foods        Follow Up:  Patient agrees to Care Plan and Follow-up.  Plan: The Managed Medicaid care management team will reach out to the patient again over the next 14 days.  Date/time  of next scheduled RN care management/care coordination outreach:  02/10/22 @ 2:30pm  Lurena Joiner RN, BSN [place] RN Care Coordinator

## 2022-01-27 NOTE — Patient Instructions (Addendum)
Visit Information  Becky Torres was given information about Medicaid Managed Care team care coordination services as a part of their [facility] Medicaid benefit. Becky Torres verbally consented to engagement with the [facility] Managed Care team.   If you are experiencing a medical emergency, please call 911 or report to your local emergency department or urgent care.   If you have a non-emergency medical problem during routine business hours, please contact your provider's office and ask to speak with a nurse.   For questions related to your [facility] health plan, please call: [phone] or go here:[email or web address]  If you would like to schedule transportation through your [facility] plan, please call the following number at least 2 days in advance of your appointment: [phone].  You can also use the MTM portal or MTM mobile app to manage your rides. For the portal, please go to [email or web address].  Call the [place] at [phone], at any time, 24 hours a day, 7 days a week. If you are in danger or need immediate medical attention call 911.  If you would like help to quit smoking, call 1-800-QUIT-NOW (740)390-9247) OR Espaol: 1-855-Djelo-Ya (1-517-616-0737) o para ms informacin haga clic aqu or Text READY to 200-400 to register via text  Becky Torres,   Please see education materials related to fetal kick counts, anemia and HTN in pregnancy provided by MyChart link.  Patient verbalizes understanding of instructions and care plan provided today and agrees to view in [place]. Active MyChart status and patient understanding of how to access instructions and care plan via MyChart confirmed with patient.     Telephone follow up appointment with Managed Medicaid care management team member scheduled for:02/10/22 @ 2:30pm  Lurena Joiner RN, BSN [place] RN Care Coordinator   Following is a copy of your plan  of care:  Care Plan : [place] of Care  Updates made by Melissa Montane, RN since 01/27/2022 12:00 AM     Problem: Health Mangement needs related to Pregnancy      Long-Range Goal: Development of Plan of Care to address Health Mangement needs related to Pregnancy   Start Date: 01/27/2022  Expected End Date: 04/27/2022  Priority: High  Note:   Current Barriers:  Knowledge Deficits related to plan of care for management of chronic HTN and mild Anemia in pregnancy   RNCM Clinical Goal(s):  Patient will verbalize understanding of plan for management of HTN and Anemia in pregnancy as evidenced by patient reports take all medications exactly as prescribed and will call provider for medication related questions as evidenced by patient reports and EMR documentation    attend all scheduled medical appointments: 01/29/22 with OB Provider, 02/02/22 and 02/09/22 with Maternal Fetal Care as evidenced by provider documentation           Interventions: Inter-disciplinary care team collaboration (see longitudinal plan of care) Evaluation of current treatment plan related to  self management and patient's adherence to plan as established by provider Advised patient to get a maternity belt to help with pelvic pain and pressure   Hypertension in Pregnancy chronic:  (Status: New goal.) Long Term Goal  Reviewed signs and symptoms of hypertension in pregnancy and advised patient to monitor for and report persistent, worsening, or severe headache, worsening or severe swelling, visual disturbances, or decrease in urination Evaluation of current treatment plan related to hypertension self management and patient's adherence to plan as established by provider;  Reviewed medications with patient and discussed importance of compliance;  Discussed plans with patient for ongoing care management follow up and provided patient with direct contact information for care management team; Advised patient,  providing education and rationale, to monitor blood pressure daily and record, calling PCP for findings outside established parameters;  Reviewed scheduled/upcoming provider appointments including: 01/29/22 with OB, 02/02/22 and 02/09/22 with Maternal Fetal Care Provided patient with information on fetal kick count  Pregnancy with Nutritional Factors -  Mild Anemia :  (Status: New goal.) Long Term Goal  Reviewed upcoming provider appointments with patient and advised to attend all OB appointments including ultrasound and antenatal testing reviewed medications and advised adherence to medication regimen advised to follow routine screening guidelines as outlined by obstetrics provider    advised to contact OB provider with concerns or questions  Provided patient with information on iron rich foods Discussed new prescription for oral iron, advised patient to pick up prescription and take as directed-every other day   Patient Goals/Self-Care Activities: Take medications as prescribed   Attend all scheduled provider appointments Call provider office for new concerns or questions  Increase intake of iron rich foods

## 2022-01-29 ENCOUNTER — Encounter: Payer: Medicaid Other | Admitting: Obstetrics and Gynecology

## 2022-01-29 DIAGNOSIS — O0990 Supervision of high risk pregnancy, unspecified, unspecified trimester: Secondary | ICD-10-CM

## 2022-01-29 DIAGNOSIS — D573 Sickle-cell trait: Secondary | ICD-10-CM

## 2022-01-29 DIAGNOSIS — O99013 Anemia complicating pregnancy, third trimester: Secondary | ICD-10-CM

## 2022-01-29 DIAGNOSIS — O10911 Unspecified pre-existing hypertension complicating pregnancy, first trimester: Secondary | ICD-10-CM

## 2022-01-31 DIAGNOSIS — Z419 Encounter for procedure for purposes other than remedying health state, unspecified: Secondary | ICD-10-CM | POA: Diagnosis not present

## 2022-02-01 ENCOUNTER — Encounter: Payer: Self-pay | Admitting: Obstetrics and Gynecology

## 2022-02-01 ENCOUNTER — Encounter: Payer: Medicaid Other | Admitting: Family Medicine

## 2022-02-02 ENCOUNTER — Ambulatory Visit: Payer: Medicaid Other | Attending: Obstetrics

## 2022-02-02 ENCOUNTER — Ambulatory Visit: Payer: Medicaid Other | Admitting: *Deleted

## 2022-02-02 VITALS — BP 103/70 | HR 76

## 2022-02-02 DIAGNOSIS — O99213 Obesity complicating pregnancy, third trimester: Secondary | ICD-10-CM | POA: Diagnosis not present

## 2022-02-02 DIAGNOSIS — R7989 Other specified abnormal findings of blood chemistry: Secondary | ICD-10-CM | POA: Diagnosis not present

## 2022-02-02 DIAGNOSIS — O10913 Unspecified pre-existing hypertension complicating pregnancy, third trimester: Secondary | ICD-10-CM | POA: Insufficient documentation

## 2022-02-02 DIAGNOSIS — O0990 Supervision of high risk pregnancy, unspecified, unspecified trimester: Secondary | ICD-10-CM | POA: Insufficient documentation

## 2022-02-02 DIAGNOSIS — K8020 Calculus of gallbladder without cholecystitis without obstruction: Secondary | ICD-10-CM | POA: Insufficient documentation

## 2022-02-02 DIAGNOSIS — O26613 Liver and biliary tract disorders in pregnancy, third trimester: Secondary | ICD-10-CM | POA: Diagnosis not present

## 2022-02-09 ENCOUNTER — Ambulatory Visit: Payer: Medicaid Other | Attending: Obstetrics

## 2022-02-09 ENCOUNTER — Ambulatory Visit: Payer: Medicaid Other | Admitting: *Deleted

## 2022-02-09 VITALS — BP 139/85 | HR 90

## 2022-02-09 DIAGNOSIS — O0990 Supervision of high risk pregnancy, unspecified, unspecified trimester: Secondary | ICD-10-CM

## 2022-02-09 DIAGNOSIS — O99213 Obesity complicating pregnancy, third trimester: Secondary | ICD-10-CM | POA: Diagnosis not present

## 2022-02-09 DIAGNOSIS — O10913 Unspecified pre-existing hypertension complicating pregnancy, third trimester: Secondary | ICD-10-CM | POA: Diagnosis not present

## 2022-02-09 DIAGNOSIS — K8020 Calculus of gallbladder without cholecystitis without obstruction: Secondary | ICD-10-CM | POA: Diagnosis not present

## 2022-02-09 DIAGNOSIS — R7989 Other specified abnormal findings of blood chemistry: Secondary | ICD-10-CM

## 2022-02-09 DIAGNOSIS — O26613 Liver and biliary tract disorders in pregnancy, third trimester: Secondary | ICD-10-CM | POA: Diagnosis not present

## 2022-02-10 ENCOUNTER — Other Ambulatory Visit: Payer: Self-pay | Admitting: *Deleted

## 2022-02-10 NOTE — Patient Outreach (Signed)
  Medicaid Managed Care   Unsuccessful Attempt Note   02/10/2022 Name: Becky Torres MRN: [id number] DOB: 09-22-1990  Referred by: Pcp, No Reason for referral : High Risk Managed Medicaid (Unsuccessful RNCM follow up telephone outreach)   An unsuccessful telephone outreach was attempted today. The patient was referred to the case management team for assistance with care management and care coordination.    Follow Up Plan: The Managed Medicaid care management team will reach out to the patient again over the next 14 days.    Lurena Joiner RN, BSN [place]  [employer] manager

## 2022-02-10 NOTE — Patient Instructions (Signed)
Visit Information  [name]  - as a part of your Medicaid benefit, you are eligible for care management and care coordination services at no cost or copay. I was unable to reach you by phone today but would be happy to help you with your health related needs. Please feel free to call me @ [phone].   A member of the Managed Medicaid care management team will reach out to you again over the next 14 days.   Lurena Joiner RN, BSN [place]  [employer] manager

## 2022-02-11 ENCOUNTER — Encounter: Payer: Medicaid Other | Admitting: Medical

## 2022-02-11 ENCOUNTER — Encounter: Payer: Medicaid Other | Admitting: Obstetrics and Gynecology

## 2022-02-16 ENCOUNTER — Other Ambulatory Visit: Payer: Self-pay | Admitting: *Deleted

## 2022-02-16 ENCOUNTER — Ambulatory Visit: Payer: Medicaid Other | Admitting: *Deleted

## 2022-02-16 ENCOUNTER — Ambulatory Visit: Payer: Medicaid Other | Attending: Obstetrics

## 2022-02-16 VITALS — BP 134/78 | HR 80

## 2022-02-16 DIAGNOSIS — O10013 Pre-existing essential hypertension complicating pregnancy, third trimester: Secondary | ICD-10-CM | POA: Diagnosis not present

## 2022-02-16 DIAGNOSIS — O99113 Other diseases of the blood and blood-forming organs and certain disorders involving the immune mechanism complicating pregnancy, third trimester: Secondary | ICD-10-CM

## 2022-02-16 DIAGNOSIS — O10913 Unspecified pre-existing hypertension complicating pregnancy, third trimester: Secondary | ICD-10-CM | POA: Insufficient documentation

## 2022-02-16 DIAGNOSIS — R7989 Other specified abnormal findings of blood chemistry: Secondary | ICD-10-CM | POA: Insufficient documentation

## 2022-02-16 DIAGNOSIS — E669 Obesity, unspecified: Secondary | ICD-10-CM | POA: Diagnosis not present

## 2022-02-16 DIAGNOSIS — K8020 Calculus of gallbladder without cholecystitis without obstruction: Secondary | ICD-10-CM | POA: Diagnosis not present

## 2022-02-16 DIAGNOSIS — O99213 Obesity complicating pregnancy, third trimester: Secondary | ICD-10-CM | POA: Insufficient documentation

## 2022-02-16 DIAGNOSIS — O368390 Maternal care for abnormalities of the fetal heart rate or rhythm, unspecified trimester, not applicable or unspecified: Secondary | ICD-10-CM | POA: Diagnosis not present

## 2022-02-16 DIAGNOSIS — Z362 Encounter for other antenatal screening follow-up: Secondary | ICD-10-CM | POA: Diagnosis not present

## 2022-02-16 DIAGNOSIS — O26613 Liver and biliary tract disorders in pregnancy, third trimester: Secondary | ICD-10-CM | POA: Diagnosis not present

## 2022-02-16 DIAGNOSIS — D573 Sickle-cell trait: Secondary | ICD-10-CM | POA: Diagnosis not present

## 2022-02-16 DIAGNOSIS — Z3A35 35 weeks gestation of pregnancy: Secondary | ICD-10-CM | POA: Diagnosis not present

## 2022-02-16 DIAGNOSIS — O0990 Supervision of high risk pregnancy, unspecified, unspecified trimester: Secondary | ICD-10-CM | POA: Insufficient documentation

## 2022-02-16 DIAGNOSIS — O10919 Unspecified pre-existing hypertension complicating pregnancy, unspecified trimester: Secondary | ICD-10-CM

## 2022-02-18 ENCOUNTER — Encounter (HOSPITAL_COMMUNITY): Payer: Self-pay | Admitting: Obstetrics & Gynecology

## 2022-02-18 ENCOUNTER — Telehealth (INDEPENDENT_AMBULATORY_CARE_PROVIDER_SITE_OTHER): Payer: Medicaid Other | Admitting: Obstetrics & Gynecology

## 2022-02-18 ENCOUNTER — Encounter: Payer: Self-pay | Admitting: Obstetrics & Gynecology

## 2022-02-18 ENCOUNTER — Inpatient Hospital Stay (HOSPITAL_COMMUNITY)
Admission: AD | Admit: 2022-02-18 | Discharge: 2022-02-21 | DRG: 797 | Disposition: A | Discharge status: Home / Self Care | Payer: Medicaid Other | Attending: Obstetrics and Gynecology | Admitting: Obstetrics and Gynecology

## 2022-02-18 DIAGNOSIS — D573 Sickle-cell trait: Secondary | ICD-10-CM | POA: Diagnosis present

## 2022-02-18 DIAGNOSIS — O9902 Anemia complicating childbirth: Secondary | ICD-10-CM | POA: Diagnosis present

## 2022-02-18 DIAGNOSIS — O10013 Pre-existing essential hypertension complicating pregnancy, third trimester: Secondary | ICD-10-CM

## 2022-02-18 DIAGNOSIS — O99113 Other diseases of the blood and blood-forming organs and certain disorders involving the immune mechanism complicating pregnancy, third trimester: Secondary | ICD-10-CM

## 2022-02-18 DIAGNOSIS — O1002 Pre-existing essential hypertension complicating childbirth: Secondary | ICD-10-CM | POA: Diagnosis present

## 2022-02-18 DIAGNOSIS — Z3A35 35 weeks gestation of pregnancy: Secondary | ICD-10-CM

## 2022-02-18 DIAGNOSIS — O479 False labor, unspecified: Principal | ICD-10-CM

## 2022-02-18 DIAGNOSIS — Z7982 Long term (current) use of aspirin: Secondary | ICD-10-CM

## 2022-02-18 DIAGNOSIS — Z302 Encounter for sterilization: Secondary | ICD-10-CM

## 2022-02-18 DIAGNOSIS — O99013 Anemia complicating pregnancy, third trimester: Secondary | ICD-10-CM

## 2022-02-18 DIAGNOSIS — O0990 Supervision of high risk pregnancy, unspecified, unspecified trimester: Secondary | ICD-10-CM

## 2022-02-18 DIAGNOSIS — D649 Anemia, unspecified: Secondary | ICD-10-CM

## 2022-02-18 DIAGNOSIS — O10913 Unspecified pre-existing hypertension complicating pregnancy, third trimester: Secondary | ICD-10-CM

## 2022-02-18 LAB — URINALYSIS, ROUTINE W REFLEX MICROSCOPIC
Bilirubin Urine: NEGATIVE
Glucose, UA: NEGATIVE mg/dL
Hgb urine dipstick: NEGATIVE
Ketones, ur: NEGATIVE mg/dL
Nitrite: NEGATIVE
Protein, ur: NEGATIVE mg/dL
Specific Gravity, Urine: 1.006 (ref 1.005–1.030)
pH: 6 (ref 5.0–8.0)

## 2022-02-18 MED ORDER — LACTATED RINGERS IV SOLN: INTRAVENOUS | Status: DC

## 2022-02-18 MED ORDER — LABETALOL HCL 200 MG PO TABS
200.0000 mg | ORAL_TABLET | Freq: Two times a day (BID) | ORAL | Status: DC
  Administered 2022-02-19 (×2): 200 mg via ORAL
  Filled 2022-02-18: qty 2
  Filled 2022-02-18: qty 1

## 2022-02-18 NOTE — MAU Provider Note (Addendum)
None     Chief Complaint:   JOLEY UTECHT is  31 y.o. G5P3013 at [redacted]w[redacted]d  ctx around "less than 10 minutes apart".  Has been contracting irregularly since yesterday, but more regular this afternoon. Didn't take BP meds today.   .   Obstetrical/Gynecological History: OB History     Gravida  5   Para  3   Term  3   Preterm  0   AB  1   Living  3      SAB  1   IAB  0   Ectopic  0   Multiple  0   Live Births  3          Past Medical History: Past Medical History:  Diagnosis Date   Anemia    Chlamydia    Hypertension    chronic, diagnosed at age 31, takes Labetalol   Sickle cell trait ([place])     Past Surgical History: History reviewed. No pertinent surgical history.  Family History: Family History  Problem Relation Age of Onset   Hypertension Mother    Stroke Father    Obesity Father    Hypertension Brother    Sickle cell anemia Son    Cancer Maternal Aunt    Hypertension Maternal Aunt    Hypertension Maternal Grandmother    Cancer Paternal Grandmother    Asthma Paternal Grandmother    Diabetes Neg Hx    Heart disease Neg Hx    Birth defects Neg Hx     Social History: Social History   Tobacco Use   Smoking status: Never   Smokeless tobacco: Never  Vaping Use   Vaping Use: Never used  Substance Use Topics   Alcohol use: No   Drug use: No    Allergies: No Known Allergies  Meds:  Medications Prior to Admission  Medication Sig Dispense Refill Last Dose   aspirin EC 81 MG tablet Take 1 tablet (81 mg total) by mouth daily. Swallow whole. 30 tablet 12 02/17/2022   labetalol (NORMODYNE) 200 MG tablet Take 1 tablet (200 mg total) by mouth every 12 (twelve) hours. 60 tablet 1 02/17/2022   Prenat-Fe Carbonyl-FA-Omega 3 (ONE-A-DAY WOMENS PRENATAL 1) 28-0.8-235 MG CAPS Take 1 capsule by mouth daily.   02/18/2022   ferrous sulfate 325 (65 FE) MG EC tablet Take 1 tablet (325 mg total) by mouth every other day. 30 tablet 2     Review of Systems    Constitutional: Negative for fever and chills Eyes: Negative for visual disturbances Respiratory: Negative for shortness of breath, dyspnea Cardiovascular: Negative for chest pain or palpitations  Gastrointestinal: Negative for vomiting, diarrhea and constipation Genitourinary: Negative for dysuria and urgency Musculoskeletal: Negative for back pain, joint pain, myalgias.  Normal ROM  Neurological: Negative for dizziness and headaches    Physical Exam  Blood pressure (!) 142/87, pulse (!) 103, temperature 98.4 F (36.9 C), temperature source Oral, resp. rate 15, height 5' 3.5" (1.613 m), weight 93.9 kg, last menstrual period 06/08/2021, SpO2 100 %. GENERAL: Well-developed, well-nourished female in no acute distress.  LUNGS: Normal respiratory effort HEART: Regular rate and rhythm. ABDOMEN: Soft, nontender, nondistended, gravid.  EXTREMITIES: Nontender, no edema, 2+ distal pulses. DTR's 2+ CERVICAL EXAM: Dilatation 3.5cm   Effacement 50%   Station -2   Presentation: cephalic FHT:  Baseline rate 140 bpm   Variability moderate  Accelerations present   Decelerations none Contractions: Every 2-6 minutes.  Pt tolerating well   Labs: No results  found for this or any previous visit (from the past 24 hour(s)). Imaging Studies:    Assessment: MERELYN KLUMP is  31 y.o. [id number] at [redacted]w[redacted]d presents with false vs early labor.  Plan: Continue obs. NICU OK w/admit Jacklyn Shell 10/19/20236:11 PM

## 2022-02-18 NOTE — MAU Note (Signed)
Leonie Green RN NICU charge nurse notified of pt's admission to [facility]

## 2022-02-18 NOTE — MAU Note (Signed)
.  Becky Torres is a 31 y.o. at [redacted]w[redacted]d here in MAU reporting: contractions since yesterday, irregular but less than 10 minutes apart. Virtial visit this am and was told to drink lots of water and rest . Pt reports she has been doing that all day but contractions are continuing. Reports positive fetal movement. Denies bleeding or ROM.  Onset of complaint: yesterday Pain score: 7/10 Vitals:   02/18/22 1800  BP: (!) 142/87  Pulse: (!) 103  Resp: 15  Temp: 98.4 F (36.9 C)  SpO2: 100%     FHT 148 Lab orders placed from triage:  urine

## 2022-02-18 NOTE — Patient Instructions (Signed)
Return to office for any scheduled appointments. Call the office or go to the MAU at [facility] [place] if: You begin to have strong, frequent contractions Your water breaks.  Sometimes it is a big gush of fluid, sometimes it is just a trickle that keeps getting your underwear wet or running down your legs You have vaginal bleeding.  It is normal to have a small amount of spotting if your cervix was checked.  You do not feel your baby moving like normal.  If you do not, get something to eat and drink and lay down and focus on feeling your baby move.   If your baby is still not moving like normal, you should call the office or go to MAU. Any other obstetric concerns.  

## 2022-02-18 NOTE — Progress Notes (Signed)
OBSTETRICS PRENATAL VIRTUAL VISIT ENCOUNTER NOTE  Provider location: [facility]   Patient location: Home  I connected with Becky Torres on 02/18/22 at  8:55 AM EDT by MyChart Video Encounter and verified that I am speaking with the correct person using two identifiers. I discussed the limitations, risks, security and privacy concerns of performing an evaluation and management service virtually and the availability of in person appointments. I also discussed with the patient that there may be a patient responsible charge related to this service. The patient expressed understanding and agreed to proceed. Subjective:  Becky Torres is a 31 y.o. [id number] at [redacted]w[redacted]d being seen today for ongoing prenatal care.  She is currently monitored for the following issues for this high-risk pregnancy and has Chronic hypertension complicating or reason for care during pregnancy, third trimester; Supervision of high risk pregnancy, antepartum; Sickle cell trait (HCC); Patient has son with Sickle Cell Disease; UTI in pregnancy; Cholelithiasis affecting pregnancy in first trimester, antepartum; and Anemia of pregnancy in third trimester on their problem list.  Patient reports occasional contractions.  Contractions: Regular. Vag. Bleeding: None.  Movement: Present. Denies any leaking of fluid.   The following portions of the patient's history were reviewed and updated as appropriate: allergies, current medications, past family history, past medical history, past social history, past surgical history and problem list.   Objective:  There were no vitals filed for this visit.  Fetal Status:     Movement: Present     General:  Alert, oriented and cooperative. Patient is in no acute distress.  Respiratory: Normal respiratory effort, no problems with respiration noted  Mental Status: Normal mood and affect. Normal behavior. Normal judgment and thought content.  Rest of  physical exam deferred due to type of encounter  Imaging: [place] MFM OB FOLLOW UP  Result Date: 02/16/2022 ----------------------------------------------------------------------  OBSTETRICS REPORT                       (Signed Final 02/16/2022 02:03 pm) ---------------------------------------------------------------------- Patient Info  ID #:       [id number]                          D.O.B.:  Apr 03, 1991 (31 yrs)  Name:       Becky Torres                 Visit Date: 02/16/2022 11:22 am ---------------------------------------------------------------------- Performed By  Attending:        Ma Rings MD         Ref. Address:     [facility]                                                             [facility]                                                             Healthcare  Performed By:     Tommie Raymond BS,       Location:  [facility]                    RDMS, RVT                                [facility] at                                                             [facility]  Referred By:      Warden Fillers MD ---------------------------------------------------------------------- Orders  #  Description                           Code        Ordered By  1  [place] MFM OB FOLLOW UP                   [id number]    YU FANG  2  [place] MFM FETAL BPP WO NON               76819.01    YU FANG     STRESS ----------------------------------------------------------------------  #  Order #                     Accession #                Episode #  1  [id number]                   [id number]                 [id number]  2  [id number]                   [id number]                 [id number] ---------------------------------------------------------------------- Indications  Hypertension - Chronic/Pre-existing            O10.019  (labetalol)  Obesity complicating pregnancy, third          O99.213  trimester (BMI 38)  Fetal arrhythmia affecting  pregnancy,          O36.8390  antepartum (Normal fetal echo)  [redacted] weeks gestation of pregnancy                Z3A.35  Cholelithiasis affecting pregnancy,            O26.619, K80.20  antepartum  Medical complication of pregnancy              O26.90  (Increased LFT's, AKI)  History of sickle cell trait                   Z86.2  Encounter for other antenatal screening  Z36.2  follow-up ---------------------------------------------------------------------- Fetal Evaluation  Num Of Fetuses:         1  Fetal Heart Rate(bpm):  150  Cardiac Activity:       Observed  Presentation:           Cephalic  Placenta:               Posterior  P. Cord Insertion:      Previously Visualized  Amniotic Fluid  AFI FV:      Within normal limits  AFI Sum(cm)     %Tile       Largest Pocket(cm)  15.5            56          6.6  RUQ(cm)       RLQ(cm)       LUQ(cm)        LLQ(cm)  2.8           3.9           6.6            2.2 ---------------------------------------------------------------------- Biophysical Evaluation  Amniotic F.V:   Pocket => 2 cm             F. Tone:        Observed  F. Movement:    Observed                   Score:          8/8  F. Breathing:   Observed ---------------------------------------------------------------------- Biometry  BPD:      91.2  mm     G. Age:  37w 0d         93  %    CI:        78.24   %    70 - 86                                                          FL/HC:      21.2   %    20.1 - 22.3  HC:      326.2  mm     G. Age:  37w 0d         63  %    HC/AC:      0.96        0.93 - 1.11  AC:      338.3  mm     G. Age:  37w 5d         98  %    FL/BPD:     75.9   %    71 - 87  FL:       69.2  mm     G. Age:  35w 4d         53  %    FL/AC:      20.5   %    20 - 24  LV:        6.1  mm  Est. FW:    3100  gm    6 lb 13 oz      92  % ---------------------------------------------------------------------- OB History  Gravidity:    5         Term:   3  TOP:  1        Living:  3  ---------------------------------------------------------------------- Gestational Age  LMP:           36w 1d        Date:  06/08/21                   EDD:   03/15/22  U/S Today:     36w 6d                                        EDD:   03/10/22  Best:          35w 1d     Det. ByMarcella Dubs         EDD:   03/22/22                                      (07/30/21) ---------------------------------------------------------------------- Anatomy  Cranium:               Appears normal         Aortic Arch:            Previously seen  Cavum:                 Appears normal         Ductal Arch:            Previously seen  Ventricles:            Appears normal         Diaphragm:              Appears normal  Choroid Plexus:        Previously seen        Stomach:                Appears normal, left                                                                        sided  Cerebellum:            Previously seen        Abdomen:                Previously seen  Posterior Fossa:       Previously seen        Abdominal Wall:         Previously seen  Nuchal Fold:           Previously seen        Cord Vessels:           Previously seen  Face:                  Orbits and profile     Kidneys:                Appear normal                         previously seen  Lips:  Previously seen        Bladder:                Appears normal  Thoracic:              Appears normal         Spine:                  Previously seen  Heart:                 Appears normal; EIF    Upper Extremities:      Previously seen  RVOT:                  Previously seen        Lower Extremities:      Previously seen  LVOT:                  Previously seen  Other:  Nasal bone, Heels, VC, and 3VV visualized previously. Female gender          previously seen. Technically difficult due to advanced GA and fetal          position. ---------------------------------------------------------------------- Cervix Uterus Adnexa  Cervix  Not visualized (advanced GA  >24wks)  Uterus  No abnormality visualized.  Right Ovary  Within normal limits.  Left Ovary  Within normal limits.  Cul De Sac  No free fluid seen.  Adnexa  No abnormality visualized. ---------------------------------------------------------------------- Comments  This patient was seen for a follow up growth scan due to  chronic hypertension treated with labetalol.  She denies any  problems since her last exam.  She was informed that the fetal growth and amniotic fluid  level appears appropriate for her gestational age.  A BPP performed today was 8 out of 8.  Due to chronic hypertension, she will return in 1 week for  another BPP. ----------------------------------------------------------------------                   Ma RingsVictor Fang, MD Electronically Signed Final Report   02/16/2022 02:03 pm ----------------------------------------------------------------------  US MFM FETAL BPP WO NON STRESS  Result Date: 02/16/2022 ----------------------------------------------------------------------  OBSTETRICS REPORT                       (Signed Final 02/16/2022 02:03 pm) ---------------------------------------------------------------------- Patient Info  ID #:       [id number]                          D.O.B.:  1990-06-08 (31 yrs)  Name:       Becky Torres                 Visit Date: 02/16/2022 11:22 am ---------------------------------------------------------------------- Performed By  Attending:        Ma RingsVictor Fang MD         Ref. Address[facility]     [facility]  Performed By:     Tommie Raymond BS,       Location:         [facility]                    RDMS, RVT                                [facility] at                                                             [facility]  Referred By:      Warden Fillers MD ---------------------------------------------------------------------- Orders  #  Description                           Code        Ordered By  1  [place] MFM OB FOLLOW UP                   76816.01    YU FANG  2  [place] MFM FETAL BPP WO NON               76819.01    YU FANG     STRESS ----------------------------------------------------------------------  #  Order #                     Accession #                Episode #  1  [id number]                   [id number]                 [id number]  2  [id number]                   [id number]                 [id number] ---------------------------------------------------------------------- Indications  Hypertension - Chronic/Pre-existing            O10.019  (labetalol)  Obesity complicating pregnancy, third          O99.213  trimester (BMI 38)  Fetal arrhythmia affecting pregnancy,          O36.8390  antepartum (Normal fetal echo)  [redacted] weeks gestation of pregnancy                Z3A.35  Cholelithiasis affecting pregnancy,            O26.619, K80.20  antepartum  Medical complication of pregnancy              O26.90  (Increased LFT's, AKI)  History of sickle cell  trait                   Z86.2  Encounter for other antenatal screening        Z36.2  follow-up ---------------------------------------------------------------------- Fetal Evaluation  Num Of Fetuses:         1  Fetal Heart Rate(bpm):  150  Cardiac Activity:       Observed  Presentation:           Cephalic  Placenta:               Posterior  P. Cord Insertion:      Previously Visualized  Amniotic Fluid  AFI FV:      Within normal limits  AFI Sum(cm)     %Tile       Largest Pocket(cm)  15.5            56          6.6  RUQ(cm)       RLQ(cm)       LUQ(cm)        LLQ(cm)  2.8           3.9           6.6            2.2 ---------------------------------------------------------------------- Biophysical Evaluation  Amniotic F.V:   Pocket => 2 cm             F. Tone:        Observed  F. Movement:    Observed                    Score:          8/8  F. Breathing:   Observed ---------------------------------------------------------------------- Biometry  BPD:      91.2  mm     G. Age:  37w 0d         93  %    CI:        78.24   %    70 - 86                                                          FL/HC:      21.2   %    20.1 - 22.3  HC:      326.2  mm     G. Age:  37w 0d         63  %    HC/AC:      0.96        0.93 - 1.11  AC:      338.3  mm     G. Age:  37w 5d         98  %    FL/BPD:     75.9   %    71 - 87  FL:       69.2  mm     G. Age:  35w 4d         53  %    FL/AC:      20.5   %    20 - 24  LV:        6.1  mm  Est. FW:    3100  gm    6 lb  13 oz      92  % ---------------------------------------------------------------------- OB History  Gravidity:    5         Term:   3  TOP:          1        Living:  3 ---------------------------------------------------------------------- Gestational Age  LMP:           36w 1d        Date:  06/08/21                   EDD:   03/15/22  U/S Today:     36w 6d                                        EDD:   03/10/22  Best:          35w 1d     Det. ByMarcella Dubs         EDD:   03/22/22                                      (07/30/21) ---------------------------------------------------------------------- Anatomy  Cranium:               Appears normal         Aortic Arch:            Previously seen  Cavum:                 Appears normal         Ductal Arch:            Previously seen  Ventricles:            Appears normal         Diaphragm:              Appears normal  Choroid Plexus:        Previously seen        Stomach:                Appears normal, left                                                                        sided  Cerebellum:            Previously seen        Abdomen:                Previously seen  Posterior Fossa:       Previously seen        Abdominal Wall:         Previously seen  Nuchal Fold:           Previously seen        Cord Vessels:           Previously seen  Face:                   Orbits and profile     Kidneys:  Appear normal                         previously seen  Lips:                  Previously seen        Bladder:                Appears normal  Thoracic:              Appears normal         Spine:                  Previously seen  Heart:                 Appears normal; EIF    Upper Extremities:      Previously seen  RVOT:                  Previously seen        Lower Extremities:      Previously seen  LVOT:                  Previously seen  Other:  Nasal bone, Heels, VC, and 3VV visualized previously. Female gender          previously seen. Technically difficult due to advanced GA and fetal          position. ---------------------------------------------------------------------- Cervix Uterus Adnexa  Cervix  Not visualized (advanced GA >24wks)  Uterus  No abnormality visualized.  Right Ovary  Within normal limits.  Left Ovary  Within normal limits.  Cul De Sac  No free fluid seen.  Adnexa  No abnormality visualized. ---------------------------------------------------------------------- Comments  This patient was seen for a follow up growth scan due to  chronic hypertension treated with labetalol.  She denies any  problems since her last exam.  She was informed that the fetal growth and amniotic fluid  level appears appropriate for her gestational age.  A BPP performed today was 8 out of 8.  Due to chronic hypertension, she will return in 1 week for  another BPP. ----------------------------------------------------------------------                   Ma Rings, MD Electronically Signed Final Report   02/16/2022 02:03 pm ----------------------------------------------------------------------  [place] MFM FETAL BPP WO NON STRESS  Result Date: 02/09/2022 ----------------------------------------------------------------------  OBSTETRICS REPORT                       (Signed Final 02/09/2022 03:44 pm) ----------------------------------------------------------------------  Patient Info  ID #:       [id number]                          D.O.B.:  08-Apr-1991 (31 yrs)  Name:       Becky Torres [facility]                 Visit Date: 02/09/2022 11:52 am ---------------------------------------------------------------------- Performed By  Attending:        Noralee Space MD        Ref. Address[facility]     [facility]                                                             [facility]  Performed By:     Cyndie Mull          Location:         [facility]                    RDMS                                     [facility] at                                                             [facility]  Referred By:      Warden Fillers MD ---------------------------------------------------------------------- Orders  #  Description                           Code        Ordered By  1  [place] MFM FETAL BPP WO NON               76819.01    YU FANG     STRESS ----------------------------------------------------------------------  #  Order #                     Accession #                Episode #  1  [id number]                   [id number]                 [id number] ---------------------------------------------------------------------- Indications  Hypertension - Chronic/Pre-existing            O10.019  (labetalol)  Obesity complicating pregnancy, third          O99.213  trimester (BMI 38)  Cholelithiasis affecting pregnancy,            O26.619, K80.20  antepartum  Medical complication of pregnancy              O26.90  (Increased LFT's, AKI)  Fetal arrhythmia affecting pregnancy,          O36.8390  antepartum (Normal fetal echo)  History of sickle cell trait                   Z86.2  [redacted] weeks gestation of pregnancy  Z3A.34 ---------------------------------------------------------------------- Vital Signs  BP:          139/85  ---------------------------------------------------------------------- Fetal Evaluation  Num Of Fetuses:         1  Fetal Heart Rate(bpm):  148  Cardiac Activity:       Observed  Presentation:           Cephalic  Placenta:               Posterior  P. Cord Insertion:      Previously Visualized  Amniotic Fluid  AFI FV:      Within normal limits  AFI Sum(cm)     %Tile       Largest Pocket(cm)  12.9            41          3.8  RUQ(cm)       RLQ(cm)       LUQ(cm)        LLQ(cm)  3.8           2.6           2.9            3.6 ---------------------------------------------------------------------- Biophysical Evaluation  Amniotic F.V:   Pocket => 2 cm             F. Tone:        Observed  F. Movement:    Observed                   Score:          8/8  F. Breathing:   Observed ---------------------------------------------------------------------- OB History  Gravidity:    5         Term:   3  TOP:          1        Living:  3 ---------------------------------------------------------------------- Gestational Age  LMP:           35w 1d        Date:  06/08/21                   EDD:   03/15/22  Best:          34w 1d     Det. ByMarcella Dubs         EDD:   03/22/22                                      (07/30/21) ---------------------------------------------------------------------- Impression  Chronic hypertension. Well-controlled on labetalol . BP  139/85 mm Hg.  Amniotic fluid is normal and good fetal activity is seen  .Antenatal testing is reassuring. BPP 8/8. Cephalic  presentation. ---------------------------------------------------------------------- Recommendations  -Continue weekly BPP till delivery. ----------------------------------------------------------------------                  Noralee Space, MD Electronically Signed Final Report   02/09/2022 03:44 pm ----------------------------------------------------------------------  [place] MFM FETAL BPP WO NON STRESS  Result Date:  02/02/2022 ----------------------------------------------------------------------  OBSTETRICS REPORT                       (Signed Final 02/02/2022 01:37 pm) ---------------------------------------------------------------------- Patient Info  ID #:       [id number]  D.O.B.:  March 12, 1991 (31 yrs)  Name:       Becky Torres [facility]                 Visit Date: 02/02/2022 11:33 am ---------------------------------------------------------------------- Performed By  Attending:        Braxton Feathers DO       Ref. Address:     [facility]                                                             [facility]                                                             Healthcare  Performed By:     Marcellina Millin       Location:         [facility]                    RDMS                                     [facility]                                                             [facility]  Referred By:      Warden Fillers MD ---------------------------------------------------------------------- Orders  #  Description                           Code        Ordered By  1  [place] MFM FETAL BPP WO NON               14782.95    YU FANG     STRESS ----------------------------------------------------------------------  #  Order #                     Accession #                Episode #  1  [id number]                   [id number]                 [id number] ---------------------------------------------------------------------- Indications  Obesity complicating pregnancy, third  Z61.096  trimester (BMI 38)  Hypertension - Chronic/Pre-existing            O10.019  (labetalol)  Cholelithiasis affecting pregnancy,            O26.619, K80.20  antepartum  Medical complication of pregnancy              O26.90  (Increased LFT's, AKI)  Fetal arrhythmia affecting pregnancy,          O36.8390  antepartum (Normal fetal echo)  [redacted] weeks  gestation of pregnancy                Z3A.33  History of sickle cell trait                   Z86.2 ---------------------------------------------------------------------- Vital Signs  BP:          103/70 ---------------------------------------------------------------------- Fetal Evaluation  Num Of Fetuses:         1  Fetal Heart Rate(bpm):  146  Cardiac Activity:       Observed  Presentation:           Cephalic  Placenta:               Posterior  P. Cord Insertion:      Previously Visualized  Amniotic Fluid  AFI FV:      Within normal limits  AFI Sum(cm)     %Tile       Largest Pocket(cm)  19.4            72          6.3  RUQ(cm)       RLQ(cm)       LUQ(cm)        LLQ(cm)  6.3           4.6           4              4.5 ---------------------------------------------------------------------- Biophysical Evaluation  Amniotic F.V:   Pocket => 2 cm             F. Tone:        Observed  F. Movement:    Observed                   Score:          8/8  F. Breathing:   Observed ---------------------------------------------------------------------- OB History  Gravidity:    5         Term:   3  TOP:          1        Living:  3 ---------------------------------------------------------------------- Gestational Age  LMP:           34w 1d        Date:  06/08/21                   EDD:   03/15/22  Best:          33w 1d     Det. ByMarcella Dubs         EDD:   03/22/22                                      (07/30/21) ---------------------------------------------------------------------- Comments  Follow-up BPP ultrasound at 33w 1d with EDD of 03/22/2022  dated by Early Ultrasound  (07/30/21). Pregnancy  complicated  by [facility] requiring medication. Her BP today is  103/70 and she is without symptoms.  Sonographic findings  Single intrauterine pregnancy at 33w 1d.  Observed fetal cardiac activity.  Cephalic presentation.  Interval fetal anatomy appears normal.  Amniotic fluid volume: Within normal limits. AFI: 19.4 cm.  MVP: 6.3 cm.   Placenta: Posterior.  BPP 8/8  Recommendations  - Continue weekly BPPs and serial growth [place]  - Consider lowering the dose of antihypertensive if her BP is  still < 120/80 in one week. She will bring a BP log to her next  prenatal appointment in one week. She is going to call and  schedule this because she had to miss her last appointment.  - Delivery around 37-38w if she still requires antihypertensive  medication to maintain normal BP ----------------------------------------------------------------------                  Braxton Feathers, DO Electronically Signed Final Report   02/02/2022 01:37 pm ----------------------------------------------------------------------  [place] MFM FETAL BPP WO NON STRESS  Result Date: 01/26/2022 ----------------------------------------------------------------------  OBSTETRICS REPORT                       (Signed Final 01/26/2022 04:40 pm) ---------------------------------------------------------------------- Patient Info  ID #:       [id number]                          D.O.B.:  07-25-1990 (31 yrs)  Name:       Becky Torres [facility]                 Visit Date: 01/26/2022 03:54 pm ---------------------------------------------------------------------- Performed By  Attending:        Braxton Feathers DO       Ref. Address:     [facility]                                                             [facility]                                                             Healthcare  Performed By:     Marcellina Millin       Location:         [facility]                    RDMS                                     [facility]  Referred By:      Warden Fillers MD ---------------------------------------------------------------------- Orders  #  Description                           Code        Ordered By  1  [place] MFM FETAL BPP WO NON                76819.01    [facility]     STRESS ----------------------------------------------------------------------  #  Order #                     Accession #                Episode #  1  [id number]                   [id number]                 [id number] ---------------------------------------------------------------------- Indications  Obesity complicating pregnancy, third          O99.213  trimester (BMI 38)  Hypertension - Chronic/Pre-existing            O10.019  (labetalol)  Cholelithiasis affecting pregnancy,            O26.619, K80.20  antepartum  Medical complication of pregnancy              O26.90  (Increased LFT's, AKI)  Fetal arrhythmia affecting pregnancy,          O36.8390  antepartum (Normal fetal echo)  History of sickle cell trait                   Z86.2  [redacted] weeks gestation of pregnancy                Z3A.32 ---------------------------------------------------------------------- Fetal Evaluation  Num Of Fetuses:         1  Fetal Heart Rate(bpm):  132  Cardiac Activity:       Observed  Presentation:           Cephalic  Placenta:               Posterior  P. Cord Insertion:      Previously Visualized  Amniotic Fluid  AFI FV:      Within normal limits  AFI Sum(cm)     %Tile       Largest Pocket(cm)  17.66           65          6.34  RUQ(cm)       RLQ(cm)       LUQ(cm)        LLQ(cm)  6.34          3.71          3.47           4.14 ---------------------------------------------------------------------- Biophysical Evaluation  Amniotic F.V:   Pocket => 2 cm             F. Tone:        Observed  F. Movement:    Observed                   Score:          8/8  F. Breathing:  Observed ---------------------------------------------------------------------- Biometry  LV:        7.1  mm ---------------------------------------------------------------------- OB History  Gravidity:    5         Term:   3  TOP:          1        Living:  3 ---------------------------------------------------------------------- Gestational Age   LMP:           33w 1d        Date:  06/08/21                   EDD:   03/15/22  Best:          32w 1d     Det. ByLoman Chroman         EDD:   03/22/22                                      (07/30/21) ---------------------------------------------------------------------- Comments  The patient is here for a BPP for [facility] on meds.  She is at  [place] with EDD of 03/22/2022 dated by Early Ultrasound  (07/30/21).  Sonographic findings  Single intrauterine pregnancy.  Observed fetal cardiac activity.  Cephalic presentation.  Interval fetal anatomy appears normal. The anatomic survey  was previously cleared.  Amniotic fluid volume: Within normal limits. AFI: 17.66 cm.  MVP: 6.34 cm.  Placenta is Posterior.  BPP is 8/8.  Recommendations  1. BBPs weekly until delivery  2. Growth ultrasounds every 4 weeks until delivery  3. Delivery around 37-[redacted] weeks gestation ----------------------------------------------------------------------                  Valeda Malm, DO Electronically Signed Final Report   01/26/2022 04:40 pm ----------------------------------------------------------------------  [place] MFM OB FOLLOW UP  Result Date: 01/19/2022 ----------------------------------------------------------------------  OBSTETRICS REPORT                       (Signed Final 01/19/2022 11:05 am) ---------------------------------------------------------------------- Patient Info  ID #:       [id number]                          D.O.B.:  February 17, 1991 (31 yrs)  Name:       Becky Torres [facility]                 Visit Date: 01/19/2022 10:11 am ---------------------------------------------------------------------- Performed By  Attending:        Johnell Comings MD         Ref. Address:     Center for                                                             [place]  Performed By:     Drew     Location:  [facility]                    RDMS                                      [facility]                                                             [facility]  Referred By:      Warden Fillers MD ---------------------------------------------------------------------- Orders  #  Description                           Code        Ordered By  1  [place] MFM OB FOLLOW UP                   62952.84    Lin Landsman ----------------------------------------------------------------------  #  Order #                     Accession #                Episode #  1  [id number]                   [id number]                 [id number] ---------------------------------------------------------------------- Indications  Obesity complicating pregnancy, third          O99.213  trimester (BMI 38)  Hypertension - Chronic/Pre-existing            O10.019  (labetalol)  Cholelithiasis affecting pregnancy,            O26.619, K80.20  antepartum  Medical complication of pregnancy              O26.90  (Increased LFT's, AKI)  History of sickle cell trait                   Z86.2  Fetal arrhythmia affecting pregnancy,          O36.8390  antepartum (Normal fetal echo)  [redacted] weeks gestation of pregnancy                Z3A.31 ---------------------------------------------------------------------- Vital Signs  Pulse:  84  BP:          129/73 ---------------------------------------------------------------------- Fetal Evaluation  Num Of Fetuses:         1  Fetal Heart Rate(bpm):  145  Cardiac Activity:       Observed  Presentation:           Cephalic  Placenta:               Posterior  P. Cord Insertion:      Previously Visualized  Amniotic Fluid  AFI FV:      Within normal limits  AFI Sum(cm)     %Tile       Largest Pocket(cm)  12.71           36          5.83  RUQ(cm)       RLQ(cm)       LUQ(cm)        LLQ(cm)  5.83          1.72          3.13            2.03 ---------------------------------------------------------------------- Biometry  BPD:     83.76  mm     G. Age:  33w 5d         96  %    CI:        79.98   %    70 - 86                                                          FL/HC:      19.9   %    19.3 - 21.3  HC:    295.93   mm     G. Age:  32w 5d         57  %    HC/AC:      1.08        0.96 - 1.17  AC:    273.37   mm     G. Age:  31w 3d         55  %    FL/BPD:     70.5   %    71 - 87  FL:      59.02  mm     G. Age:  30w 6d         26  %    FL/AC:      21.6   %    20 - 24  HUM:      51.6  mm     G. Age:  30w 1d         31  %  LV:        6.3  mm  Est. FW:    1786  gm    3 lb 15 oz      51  % ---------------------------------------------------------------------- OB History  Gravidity:    5         Term:   3  TOP:          1        Living:  3 ---------------------------------------------------------------------- Gestational Age  LMP:           32w 1d  Date:  06/08/21                   EDD:   03/15/22  U/S Today:     32w 1d                                        EDD:   03/15/22  Best:          31w 1d     Det. ByMarcella Dubs         EDD:   03/22/22                                      (07/30/21) ---------------------------------------------------------------------- Anatomy  Cranium:               Appears normal         LVOT:                   Previously seen  Cavum:                 Appears normal         Aortic Arch:            Previously seen  Ventricles:            Appears normal         Ductal Arch:            Previously seen  Choroid Plexus:        Previously seen        Diaphragm:              Appears normal  Cerebellum:            Previously seen        Stomach:                Appears normal, left                                                                        sided  Posterior Fossa:       Previously seen        Abdomen:                Appears normal  Nuchal Fold:           Not applicable (>20    Abdominal Wall:         Previously seen                          wks GA)  Face:                  Orbits and profile     Cord Vessels:           Previously seen                         previously seen  Lips:  Previously seen        Kidneys:                Appear normal  Palate:                Previously seen        Bladder:                Appears normal  Thoracic:              Appears normal         Spine:                  Previously seen  Heart:                 Appears normal         Upper Extremities:      Previously seen                         (4CH, axis, and                         situs)  RVOT:                  Previously seen        Lower Extremities:      Previously seen  Other:  Technically difficult due to maternal habitus and fetal position. ---------------------------------------------------------------------- Cervix Uterus Adnexa  Cervix  Not visualized (advanced GA >24wks)  Uterus  No abnormality visualized.  Right Ovary  Within normal limits.  Left Ovary  Within normal limits. ---------------------------------------------------------------------- Comments  This patient was seen for a follow up growth scan due to  maternal obesity and chronic hypertension treated with  labetalol.  She denies any problems since her last exam.  She was informed that the fetal growth and amniotic fluid  level appears appropriate for her gestational age.  Due to maternal obesity and chronic hypertension, we will  start weekly fetal testing at 32 weeks.  She will return in 1 week for a BPP. ----------------------------------------------------------------------                   Ma Rings, MD Electronically Signed Final Report   01/19/2022 11:05 am ----------------------------------------------------------------------   Assessment and Plan:  Pregnancy: [id number] at [redacted]w[redacted]d 1. Chronic hypertension complicating or reason for care during pregnancy, third trimester Stable on Labetalol. Continue antenatal testing and scans as per MFM. IOL likely at 39  weeks or earlier if indicated by MFM.   2. [redacted] weeks gestation of pregnancy 3. Supervision of high risk pregnancy, antepartum No other concerns.  Preterm labor symptoms and general obstetric precautions including but not limited to vaginal bleeding, contractions, leaking of fluid and fetal movement were reviewed in detail with the patient. I discussed the assessment and treatment plan with the patient. The patient was provided an opportunity to ask questions and all were answered. The patient agreed with the plan and demonstrated an understanding of the instructions. The patient was advised to call back or seek an in-person office evaluation/go to [facility]U at [facility] for any urgent or concerning symptoms. Please refer to After Visit Summary for other counseling recommendations.   I provided 10 minutes of face-to-face time during this encounter.  Return in about 1 week (around 02/25/2022) for Pelvic cultures, OFFICE OB VISIT (MD only).  Future Appointments  Date Time Provider Department Center  02/23/2022 11:15 AM [facility] NURSE [facility]  02/23/2022 11:30 AM [facility] US2 [facility]  02/26/2022  9:35 AM Milas Hock, MD [facility]  03/01/2022  3:15 PM [facility] NURSE [facility]  03/01/2022  3:30 PM [facility] US2 [facility]  03/05/2022  9:15 AM Warden Fillers, MD [facility]  03/05/2022  1:45 PM [facility] NURSE [facility]  03/05/2022  2:00 PM [facility] US1 [facility]  03/11/2022 10:35 AM Corlis Hove, NP [facility]  03/19/2022 10:55 AM Reva Bores, MD [facility]  03/22/2022 11:00 AM [facility] CCC-MM SOCIAL WORKER 2 THN-CCC None  03/24/2022 10:35 AM Shooter Tangen, Jethro Bastos, MD [facility]  03/24/2022 11:15 AM [facility]WOCA NST [facility]    Jaynie Collins, MD Center for [employer], [facility]

## 2022-02-18 NOTE — H&P (Signed)
TAMITHA NORELL is a 31 y.o. female [phone] with IUP at [redacted]w[redacted]d by early [place] presenting for contractions. Has been in OBS in MAU for 6 hours and cx has slowly changed from 3.5/50/-2 to 5.5-70/-2  Pt feels like ctx are still getting stronger, so will admit in early labor. .  She reports positive fetal movement. She denies leakage of fluid or vaginal bleeding.  Prenatal History/Complications: PNC at [facility]  Pregnancy complications:  - Past Medical History: Past Medical History:  Diagnosis Date   Anemia    Chlamydia    Hypertension    chronic, diagnosed at age 39, takes Labetalol   Sickle cell trait ([place])     Past Surgical History: History reviewed. No pertinent surgical history.  Obstetrical History: OB History     Gravida  5   Para  3   Term  3   Preterm  0   AB  1   Living  3      SAB  1   IAB  0   Ectopic  0   Multiple  0   Live Births  3            Social History: Social History   Socioeconomic History   Marital status: Significant Other    Spouse name: Not on file   Number of children: Not on file   Years of education: Not on file   Highest education level: Not on file  Occupational History   Not on file  Tobacco Use   Smoking status: Never   Smokeless tobacco: Never  Vaping Use   Vaping Use: Never used  Substance and Sexual Activity   Alcohol use: No   Drug use: No   Sexual activity: Yes    Birth control/protection: None  Other Topics Concern   Not on file  Social History Narrative   Not on file   Social Determinants of Health   Financial Resource Strain: Low Risk  (12/23/2021)   Overall Financial Resource Strain (CARDIA)    Difficulty of Paying Living Expenses: Not very hard  Food Insecurity: No Food Insecurity (01/15/2022)   Hunger Vital Sign    Worried About Running Out of Food in the Last Year: Never true    Ran Out of Food in the Last Year: Never true  Transportation Needs: No Transportation Needs (12/23/2021)   PRAPARE -  Hydrologist (Medical): No    Lack of Transportation (Non-Medical): No  Physical Activity: Not on file  Stress: Not on file  Social Connections: Not on file    Family History: Family History  Problem Relation Age of Onset   Hypertension Mother    Stroke Father    Obesity Father    Hypertension Brother    Sickle cell anemia Son    Cancer Maternal Aunt    Hypertension Maternal Aunt    Hypertension Maternal Grandmother    Cancer Paternal Grandmother    Asthma Paternal Grandmother    Diabetes Neg Hx    Heart disease Neg Hx    Birth defects Neg Hx     Allergies: No Known Allergies  Medications Prior to Admission  Medication Sig Dispense Refill Last Dose   aspirin EC 81 MG tablet Take 1 tablet (81 mg total) by mouth daily. Swallow whole. 30 tablet 12 02/17/2022   labetalol (NORMODYNE) 200 MG tablet Take 1 tablet (200 mg total) by mouth every 12 (twelve) hours. 60 tablet 1 02/17/2022  Prenat-Fe Carbonyl-FA-Omega 3 (ONE-A-DAY WOMENS PRENATAL 1) 28-0.8-235 MG CAPS Take 1 capsule by mouth daily.   02/18/2022   ferrous sulfate 325 (65 FE) MG EC tablet Take 1 tablet (325 mg total) by mouth every other day. 30 tablet 2     Review of Systems   Constitutional: Negative for fever and chills Eyes: Negative for visual disturbances Respiratory: Negative for shortness of breath, dyspnea Cardiovascular: Negative for chest pain or palpitations  Gastrointestinal: Negative for vomiting, diarrhea and constipation.  POSITIVE for abdominal pain (contractions) Genitourinary: Negative for dysuria and urgency Musculoskeletal: Negative for back pain, joint pain, myalgias  Neurological: Negative for dizziness and headaches  Blood pressure (!) 153/102, pulse (!) 114, temperature 98.4 F (36.9 C), temperature source Oral, resp. rate 15, height 5' 3.5" (1.613 m), weight 93.9 kg, last menstrual period 06/08/2021, SpO2 99 %. General appearance: alert, cooperative, and no  distress Lungs: normal respiratory effort Heart: regular rate and rhythm Abdomen: soft, non-tender; bowel sounds normal Extremities: Homans sign is negative, no sign of DVT DTR's 2+ Presentation: cephalic Fetal monitoring  Baseline: 150 bpm, Variability: Good {> 6 bpm), Accelerations: Reactive, and Decelerations: Absent Uterine activity  2-4 minutes Dilation: 5.5 Effacement (%): 50 Station: -2 Exam by:: Cres-Dishmon CNM   Prenatal labs: ABO, Rh: --/--/B POS (04/27 2345) Antibody: NEG (04/27 2345) Rubella: 21.20 (04/26 1433) RPR: Non Reactive (09/15 0828)  HBsAg: NON REACTIVE (04/29 0530)  HIV: Non Reactive (09/15 0828)    Nursing Staff Provider  Office Location  [facility] Dating    [facility] Model [x ] Traditional [ ]  Centering [ ]  Mom-Baby Dyad    Language  English  Anatomy    Flu Vaccine  declined Genetic/Carrier Screen  NIPS:   Low risk AFP:   normal Horizon: Hgb AS  TDaP Vaccine   12/23/21 Hgb A1C or  GTT Early : A1 c normal Third trimester - normal  COVID Vaccine    LAB RESULTS   Rhogam   Blood Type --/--/B POS (04/27 2345)   Baby Feeding Plan Breast & Bottle  Antibody NEG (04/27 2345)  Contraception BTL Rubella 21.20 (04/26 1433)  Circumcision If Boy , Yes  RPR Non Reactive (04/26 1433)   Pediatrician  [facility]  HBsAg NON REACTIVE (04/29 0530)   Support Person Fiance  HCVAb neg  Prenatal Classes  HIV Non Reactive (04/26 1433)     BTL Consent 12/23/2021 GBS   (For PCN allergy, check sensitivities)   VBAC Consent  Pap        DME Rx [ ]  BP cuff [ ]  Weight Scale Waterbirth  [ ]  Class [ ]  Consent [ ]  CNM visit  PHQ9 & GAD7 [  ] new OB [  ] 28 weeks  [  ] 36 weeks Induction  [ ]  Orders Entered [ ] Foley Y/N   Prenatal Transfer Tool  Maternal Diabetes: No Genetic Screening: Normal Maternal Ultrasounds/Referrals: Normal Fetal Ultrasounds or other Referrals:  None Maternal Substance Abuse:  No Significant Maternal Medications:  None Significant Maternal Lab  Results: Other:  GBS unknown  Results for orders placed or performed during the hospital encounter of 02/18/22 (from the past 24 hour(s))  Urinalysis, Routine w reflex microscopic Urine, Clean Catch   Collection Time: 02/18/22  5:47 PM  Result Value Ref Range   Color, Urine YELLOW YELLOW   APPearance HAZY (A) CLEAR   Specific Gravity, Urine 1.006 1.005 - 1.030   pH 6.0 5.0 - 8.0   Glucose, UA  NEGATIVE NEGATIVE mg/dL   Hgb urine dipstick NEGATIVE NEGATIVE   Bilirubin Urine NEGATIVE NEGATIVE   Ketones, ur NEGATIVE NEGATIVE mg/dL   Protein, ur NEGATIVE NEGATIVE mg/dL   Nitrite NEGATIVE NEGATIVE   Leukocytes,Ua MODERATE (A) NEGATIVE   RBC / HPF 0-5 0 - 5 RBC/hpf   WBC, UA 21-50 0 - 5 WBC/hpf   Bacteria, UA RARE (A) NONE SEEN   Squamous Epithelial / LPF 11-20 0 - 5    Assessment: ALYSSAH ALGEO is a 31 y.o. [id number] with an IUP at [redacted]w[redacted]d presenting for probably early labor,  Plan: #Labor: expectant management #Pain:  Per request #FWB Cat 1 #ID: GBS: PCN, culture collected  #MOF:  breast #MOC: BTL #Circ: yes   Jacklyn Shell 02/18/2022, 11:37 PM

## 2022-02-19 ENCOUNTER — Inpatient Hospital Stay (HOSPITAL_COMMUNITY): Payer: Medicaid Other | Admitting: Anesthesiology

## 2022-02-19 ENCOUNTER — Encounter (HOSPITAL_COMMUNITY): Payer: Self-pay | Admitting: Obstetrics & Gynecology

## 2022-02-19 ENCOUNTER — Other Ambulatory Visit: Payer: Self-pay

## 2022-02-19 ENCOUNTER — Encounter (HOSPITAL_COMMUNITY)
Admission: AD | Disposition: A | Discharge status: Home / Self Care | Payer: Self-pay | Source: Home / Self Care | Attending: Obstetrics and Gynecology

## 2022-02-19 DIAGNOSIS — Z3A35 35 weeks gestation of pregnancy: Secondary | ICD-10-CM | POA: Diagnosis not present

## 2022-02-19 DIAGNOSIS — D649 Anemia, unspecified: Secondary | ICD-10-CM | POA: Diagnosis not present

## 2022-02-19 DIAGNOSIS — Z322 Encounter for childbirth instruction: Secondary | ICD-10-CM | POA: Diagnosis not present

## 2022-02-19 DIAGNOSIS — D573 Sickle-cell trait: Secondary | ICD-10-CM | POA: Diagnosis not present

## 2022-02-19 DIAGNOSIS — Z302 Encounter for sterilization: Secondary | ICD-10-CM | POA: Diagnosis not present

## 2022-02-19 DIAGNOSIS — O164 Unspecified maternal hypertension, complicating childbirth: Secondary | ICD-10-CM | POA: Diagnosis not present

## 2022-02-19 DIAGNOSIS — O9902 Anemia complicating childbirth: Secondary | ICD-10-CM | POA: Diagnosis not present

## 2022-02-19 DIAGNOSIS — O1002 Pre-existing essential hypertension complicating childbirth: Secondary | ICD-10-CM | POA: Diagnosis not present

## 2022-02-19 DIAGNOSIS — Z7982 Long term (current) use of aspirin: Secondary | ICD-10-CM | POA: Diagnosis not present

## 2022-02-19 HISTORY — PX: TUBAL LIGATION: SHX77

## 2022-02-19 LAB — COMPREHENSIVE METABOLIC PANEL
ALT: 13 U/L (ref 0–44)
AST: 20 U/L (ref 15–41)
Albumin: 2.8 g/dL — ABNORMAL LOW (ref 3.5–5.0)
Alkaline Phosphatase: 165 U/L — ABNORMAL HIGH (ref 38–126)
Anion gap: 11 (ref 5–15)
BUN: 5 mg/dL — ABNORMAL LOW (ref 6–20)
CO2: 20 mmol/L — ABNORMAL LOW (ref 22–32)
Calcium: 9.1 mg/dL (ref 8.9–10.3)
Chloride: 104 mmol/L (ref 98–111)
Creatinine, Ser: 0.95 mg/dL (ref 0.44–1.00)
GFR, Estimated: >60 mL/min (ref >60)
Glucose, Bld: 93 mg/dL (ref 70–99)
Potassium: 3.4 mmol/L — ABNORMAL LOW (ref 3.5–5.1)
Sodium: 135 mmol/L (ref 135–145)
Total Bilirubin: 0.5 mg/dL (ref 0.3–1.2)
Total Protein: 7.1 g/dL (ref 6.5–8.1)

## 2022-02-19 LAB — PROTEIN / CREATININE RATIO, URINE
Creatinine, Urine: 62 mg/dL
Protein Creatinine Ratio: 0.19 mg/mg{Cre} — ABNORMAL HIGH (ref 0.00–0.15)
Total Protein, Urine: 12 mg/dL

## 2022-02-19 LAB — CBC
HCT: 24.2 % — ABNORMAL LOW (ref 36.0–46.0)
Hemoglobin: 7.5 g/dL — ABNORMAL LOW (ref 12.0–15.0)
MCH: 20 pg — ABNORMAL LOW (ref 26.0–34.0)
MCHC: 31 g/dL (ref 30.0–36.0)
MCV: 64.5 fL — ABNORMAL LOW (ref 80.0–100.0)
Platelets: 181 10*3/uL (ref 150–400)
RBC: 3.75 MIL/uL — ABNORMAL LOW (ref 3.87–5.11)
RDW: 18 % — ABNORMAL HIGH (ref 11.5–15.5)
WBC: 10.6 10*3/uL — ABNORMAL HIGH (ref 4.0–10.5)
nRBC: 0 % (ref 0.0–0.2)

## 2022-02-19 LAB — TYPE AND SCREEN
ABO/RH(D): B POS
Antibody Screen: NEGATIVE

## 2022-02-19 LAB — RPR: RPR Ser Ql: NONREACTIVE

## 2022-02-19 SURGERY — LIGATION, FALLOPIAN TUBE, POSTPARTUM
Anesthesia: Epidural

## 2022-02-19 MED ORDER — WITCH HAZEL-GLYCERIN EX PADS: 1.0000 | MEDICATED_PAD | CUTANEOUS | Status: DC | PRN

## 2022-02-19 MED ORDER — OXYCODONE-ACETAMINOPHEN 5-325 MG PO TABS: 1.0000 | ORAL_TABLET | ORAL | Status: DC | PRN

## 2022-02-19 MED ORDER — SOD CITRATE-CITRIC ACID 500-334 MG/5ML PO SOLN
30.0000 mL | ORAL | Status: DC | PRN
  Administered 2022-02-19: 30 mL via ORAL
  Filled 2022-02-19: qty 30

## 2022-02-19 MED ORDER — ONDANSETRON HCL 4 MG/2ML IJ SOLN
INTRAMUSCULAR | Status: DC | PRN
  Administered 2022-02-19: 4 mg via INTRAVENOUS

## 2022-02-19 MED ORDER — EPHEDRINE 5 MG/ML INJ: 10.0000 mg | INTRAVENOUS | Status: DC | PRN

## 2022-02-19 MED ORDER — TRANEXAMIC ACID-NACL 1000-0.7 MG/100ML-% IV SOLN
1000.0000 mg | INTRAVENOUS | Status: AC
  Administered 2022-02-19: 1000 mg via INTRAVENOUS

## 2022-02-19 MED ORDER — ACETAMINOPHEN 325 MG PO TABS
650.0000 mg | ORAL_TABLET | ORAL | Status: DC | PRN
  Administered 2022-02-19 – 2022-02-20 (×2): 650 mg via ORAL
  Filled 2022-02-19 (×2): qty 2

## 2022-02-19 MED ORDER — LACTATED RINGERS IV SOLN: 500.0000 mL | Freq: Once | INTRAVENOUS | Status: DC

## 2022-02-19 MED ORDER — LIDOCAINE HCL (PF) 1 % IJ SOLN: 30.0000 mL | INTRAMUSCULAR | Status: DC | PRN

## 2022-02-19 MED ORDER — IBUPROFEN 600 MG PO TABS
600.0000 mg | ORAL_TABLET | Freq: Four times a day (QID) | ORAL | Status: DC
  Administered 2022-02-19 – 2022-02-21 (×8): 600 mg via ORAL
  Filled 2022-02-19 (×8): qty 1

## 2022-02-19 MED ORDER — FLEET ENEMA 7-19 GM/118ML RE ENEM: 1.0000 | ENEMA | RECTAL | Status: DC | PRN

## 2022-02-19 MED ORDER — ACETAMINOPHEN 325 MG PO TABS: 650.0000 mg | ORAL_TABLET | ORAL | Status: DC | PRN

## 2022-02-19 MED ORDER — DIBUCAINE (PERIANAL) 1 % EX OINT: 1.0000 | TOPICAL_OINTMENT | CUTANEOUS | Status: DC | PRN

## 2022-02-19 MED ORDER — SODIUM CHLORIDE 0.9 % IV SOLN
500.0000 mg | Freq: Once | INTRAVENOUS | Status: AC
  Administered 2022-02-19: 500 mg via INTRAVENOUS
  Filled 2022-02-19 (×2): qty 25

## 2022-02-19 MED ORDER — LIDOCAINE-EPINEPHRINE (PF) 2 %-1:200000 IJ SOLN
INTRAMUSCULAR | Status: DC | PRN
  Administered 2022-02-19: 5 mL via EPIDURAL

## 2022-02-19 MED ORDER — LIDOCAINE-EPINEPHRINE (PF) 2 %-1:200000 IJ SOLN
INTRAMUSCULAR | Status: AC
  Filled 2022-02-19: qty 40

## 2022-02-19 MED ORDER — PHENYLEPHRINE 80 MCG/ML (10ML) SYRINGE FOR IV PUSH (FOR BLOOD PRESSURE SUPPORT): 80.0000 ug | PREFILLED_SYRINGE | INTRAVENOUS | Status: DC | PRN

## 2022-02-19 MED ORDER — SODIUM CHLORIDE 0.9% FLUSH: 3.0000 mL | Freq: Two times a day (BID) | INTRAVENOUS | Status: DC

## 2022-02-19 MED ORDER — FENTANYL CITRATE (PF) 100 MCG/2ML IJ SOLN: 25.0000 ug | INTRAMUSCULAR | Status: DC | PRN

## 2022-02-19 MED ORDER — OXYTOCIN-SODIUM CHLORIDE 30-0.9 UT/500ML-% IV SOLN: 2.5000 [IU]/h | INTRAVENOUS | Status: DC

## 2022-02-19 MED ORDER — MEASLES, MUMPS & RUBELLA VAC IJ SOLR: 0.5000 mL | Freq: Once | INTRAMUSCULAR | Status: DC

## 2022-02-19 MED ORDER — DEXMEDETOMIDINE (PRECEDEX) IN NS 20 MCG/5ML (4 MCG/ML) IV SYRINGE
PREFILLED_SYRINGE | INTRAVENOUS | Status: DC | PRN
  Administered 2022-02-19: 8 ug via INTRAVENOUS

## 2022-02-19 MED ORDER — MIDAZOLAM HCL 5 MG/5ML IJ SOLN
INTRAMUSCULAR | Status: DC | PRN
  Administered 2022-02-19 (×2): 1 mg via INTRAVENOUS

## 2022-02-19 MED ORDER — TRANEXAMIC ACID-NACL 1000-0.7 MG/100ML-% IV SOLN
INTRAVENOUS | Status: AC
  Filled 2022-02-19: qty 100

## 2022-02-19 MED ORDER — FENTANYL CITRATE (PF) 100 MCG/2ML IJ SOLN
INTRAMUSCULAR | Status: DC | PRN
  Administered 2022-02-19: 100 ug via EPIDURAL

## 2022-02-19 MED ORDER — ONDANSETRON HCL 4 MG/2ML IJ SOLN: 4.0000 mg | INTRAMUSCULAR | Status: DC | PRN

## 2022-02-19 MED ORDER — FENTANYL CITRATE (PF) 100 MCG/2ML IJ SOLN
INTRAMUSCULAR | Status: AC
  Filled 2022-02-19: qty 2

## 2022-02-19 MED ORDER — SODIUM CHLORIDE 0.9 % IV SOLN: 250.0000 mL | INTRAVENOUS | Status: DC | PRN

## 2022-02-19 MED ORDER — COCONUT OIL OIL: 1.0000 | TOPICAL_OIL | Status: DC | PRN

## 2022-02-19 MED ORDER — ONDANSETRON HCL 4 MG PO TABS: 4.0000 mg | ORAL_TABLET | ORAL | Status: DC | PRN

## 2022-02-19 MED ORDER — OXYTOCIN-SODIUM CHLORIDE 30-0.9 UT/500ML-% IV SOLN
1.0000 m[IU]/min | INTRAVENOUS | Status: DC
  Administered 2022-02-19: 2 m[IU]/min via INTRAVENOUS
  Filled 2022-02-19: qty 500

## 2022-02-19 MED ORDER — DROPERIDOL 2.5 MG/ML IJ SOLN: 0.6250 mg | Freq: Once | INTRAMUSCULAR | Status: DC | PRN

## 2022-02-19 MED ORDER — BUPIVACAINE HCL (PF) 0.25 % IJ SOLN
INTRAMUSCULAR | Status: DC | PRN
  Administered 2022-02-19: 8 mL via EPIDURAL

## 2022-02-19 MED ORDER — SODIUM CHLORIDE 0.9 % IV SOLN
5.0000 10*6.[IU] | Freq: Once | INTRAVENOUS | Status: AC
  Administered 2022-02-19: 5 10*6.[IU] via INTRAVENOUS
  Filled 2022-02-19: qty 5

## 2022-02-19 MED ORDER — BUPIVACAINE HCL (PF) 0.25 % IJ SOLN
INTRAMUSCULAR | Status: AC
  Filled 2022-02-19: qty 30

## 2022-02-19 MED ORDER — PRENATAL MULTIVITAMIN CH
1.0000 | ORAL_TABLET | Freq: Every day | ORAL | Status: DC
  Administered 2022-02-20 – 2022-02-21 (×2): 1 via ORAL
  Filled 2022-02-19 (×2): qty 1

## 2022-02-19 MED ORDER — DIPHENHYDRAMINE HCL 50 MG/ML IJ SOLN: 12.5000 mg | INTRAMUSCULAR | Status: DC | PRN

## 2022-02-19 MED ORDER — FENTANYL-BUPIVACAINE-NACL 0.5-0.125-0.9 MG/250ML-% EP SOLN
12.0000 mL/h | EPIDURAL | Status: DC | PRN
  Administered 2022-02-19: 12 mL/h via EPIDURAL
  Filled 2022-02-19: qty 250

## 2022-02-19 MED ORDER — SENNOSIDES-DOCUSATE SODIUM 8.6-50 MG PO TABS
2.0000 | ORAL_TABLET | ORAL | Status: DC
  Administered 2022-02-20 – 2022-02-21 (×2): 2 via ORAL
  Filled 2022-02-19 (×2): qty 2

## 2022-02-19 MED ORDER — OXYCODONE HCL 5 MG PO TABS
5.0000 mg | ORAL_TABLET | ORAL | Status: DC | PRN
  Administered 2022-02-20: 5 mg via ORAL
  Filled 2022-02-19: qty 1

## 2022-02-19 MED ORDER — FENTANYL CITRATE (PF) 100 MCG/2ML IJ SOLN: 50.0000 ug | INTRAMUSCULAR | Status: DC | PRN

## 2022-02-19 MED ORDER — LIDOCAINE-EPINEPHRINE (PF) 2 %-1:200000 IJ SOLN
INTRAMUSCULAR | Status: DC | PRN
  Administered 2022-02-19: 5 mL via EPIDURAL
  Administered 2022-02-19 (×2): 2 mL via EPIDURAL
  Administered 2022-02-19: 5 mL via EPIDURAL
  Administered 2022-02-19: 3 mL via EPIDURAL

## 2022-02-19 MED ORDER — TETANUS-DIPHTH-ACELL PERTUSSIS 5-2.5-18.5 LF-MCG/0.5 IM SUSY: 0.5000 mL | PREFILLED_SYRINGE | Freq: Once | INTRAMUSCULAR | Status: DC

## 2022-02-19 MED ORDER — DIPHENHYDRAMINE HCL 25 MG PO CAPS: 25.0000 mg | ORAL_CAPSULE | Freq: Four times a day (QID) | ORAL | Status: DC | PRN

## 2022-02-19 MED ORDER — OXYCODONE HCL 5 MG PO TABS: 5.0000 mg | ORAL_TABLET | Freq: Once | ORAL | Status: DC | PRN

## 2022-02-19 MED ORDER — HYDROXYZINE HCL 50 MG PO TABS: 50.0000 mg | ORAL_TABLET | Freq: Four times a day (QID) | ORAL | Status: DC | PRN

## 2022-02-19 MED ORDER — TERBUTALINE SULFATE 1 MG/ML IJ SOLN: 0.2500 mg | Freq: Once | INTRAMUSCULAR | Status: DC | PRN

## 2022-02-19 MED ORDER — FENTANYL CITRATE (PF) 100 MCG/2ML IJ SOLN
INTRAMUSCULAR | Status: DC | PRN
  Administered 2022-02-19 (×2): 50 ug via INTRAVENOUS

## 2022-02-19 MED ORDER — MIDAZOLAM HCL 2 MG/2ML IJ SOLN
INTRAMUSCULAR | Status: AC
  Filled 2022-02-19: qty 2

## 2022-02-19 MED ORDER — SIMETHICONE 80 MG PO CHEW: 80.0000 mg | CHEWABLE_TABLET | ORAL | Status: DC | PRN

## 2022-02-19 MED ORDER — PENICILLIN G POT IN DEXTROSE 60000 UNIT/ML IV SOLN
3.0000 10*6.[IU] | INTRAVENOUS | Status: DC
  Administered 2022-02-19 (×3): 3 10*6.[IU] via INTRAVENOUS
  Filled 2022-02-19 (×3): qty 50

## 2022-02-19 MED ORDER — ONDANSETRON HCL 4 MG/2ML IJ SOLN
4.0000 mg | Freq: Four times a day (QID) | INTRAMUSCULAR | Status: DC | PRN
  Administered 2022-02-19: 4 mg via INTRAVENOUS
  Filled 2022-02-19: qty 2

## 2022-02-19 MED ORDER — OXYCODONE HCL 5 MG/5ML PO SOLN: 5.0000 mg | Freq: Once | ORAL | Status: DC | PRN

## 2022-02-19 MED ORDER — OXYTOCIN BOLUS FROM INFUSION
333.0000 mL | Freq: Once | INTRAVENOUS | Status: AC
  Administered 2022-02-19: 333 mL via INTRAVENOUS

## 2022-02-19 MED ORDER — LACTATED RINGERS IV SOLN: INTRAVENOUS | Status: DC | PRN

## 2022-02-19 MED ORDER — BENZOCAINE-MENTHOL 20-0.5 % EX AERO
1.0000 | INHALATION_SPRAY | CUTANEOUS | Status: DC | PRN
  Filled 2022-02-19: qty 56

## 2022-02-19 MED ORDER — OXYCODONE-ACETAMINOPHEN 5-325 MG PO TABS: 2.0000 | ORAL_TABLET | ORAL | Status: DC | PRN

## 2022-02-19 MED ORDER — LACTATED RINGERS IV SOLN: 500.0000 mL | INTRAVENOUS | Status: DC | PRN

## 2022-02-19 MED ORDER — BUPIVACAINE HCL (PF) 0.25 % IJ SOLN
INTRAMUSCULAR | Status: DC | PRN
  Administered 2022-02-19: 10 mL

## 2022-02-19 MED ORDER — SODIUM CHLORIDE 0.9% FLUSH: 3.0000 mL | INTRAVENOUS | Status: DC | PRN

## 2022-02-19 SURGICAL SUPPLY — 28 items
ADH SKN CLS APL DERMABOND .7 (GAUZE/BANDAGES/DRESSINGS) ×1
CLOTH BEACON ORANGE TIMEOUT ST (SAFETY) ×1 IMPLANT
DERMABOND ADVANCED .7 DNX12 (GAUZE/BANDAGES/DRESSINGS) ×1 IMPLANT
DRSG OPSITE POSTOP 3X4 (GAUZE/BANDAGES/DRESSINGS) ×1 IMPLANT
ELECT REM PT RETURN 9FT ADLT (ELECTROSURGICAL) ×1
ELECTRODE REM PT RTRN 9FT ADLT (ELECTROSURGICAL) ×1 IMPLANT
GLOVE BIOGEL PI IND STRL 6.5 (GLOVE) ×1 IMPLANT
GLOVE BIOGEL PI IND STRL 7.0 (GLOVE) ×1 IMPLANT
GLOVE ECLIPSE 6.5 STRL STRAW (GLOVE) ×1 IMPLANT
GOWN STRL REUS W/TWL LRG LVL3 (GOWN DISPOSABLE) ×2 IMPLANT
NEEDLE HYPO 22GX1.5 SAFETY (NEEDLE) ×1 IMPLANT
NS IRRIG 1000ML POUR BTL (IV SOLUTION) ×1 IMPLANT
PACK ABDOMINAL MINOR (CUSTOM PROCEDURE TRAY) ×1 IMPLANT
PENCIL BUTTON HOLSTER BLD 10FT (ELECTRODE) ×1 IMPLANT
PROTECTOR NERVE ULNAR (MISCELLANEOUS) ×1 IMPLANT
SPONGE LAP 4X18 RFD (DISPOSABLE) IMPLANT
SUT PLAIN 0 NONE (SUTURE) ×1 IMPLANT
SUT PLAIN 2 0 (SUTURE) ×1
SUT PLAIN ABS 2-0 CT1 27XMFL (SUTURE) IMPLANT
SUT VIC AB 0 CT1 27 (SUTURE) ×1
SUT VIC AB 0 CT1 27XBRD ANBCTR (SUTURE) ×1 IMPLANT
SUT VICRYL 4-0 PS2 18IN ABS (SUTURE) ×1 IMPLANT
SYR CONTROL 10ML LL (SYRINGE) ×1 IMPLANT
TOWEL OR 17X24 6PK STRL BLUE (TOWEL DISPOSABLE) ×2 IMPLANT
TRAY FOLEY CATH SILVER 14FR (SET/KITS/TRAYS/PACK) ×1 IMPLANT
TUBING NON-CON 1/4 X 20 CONN (TUBING) ×1 IMPLANT
WATER STERILE IRR 1000ML POUR (IV SOLUTION) ×1 IMPLANT
YANKAUER SUCT BULB TIP NO VENT (SUCTIONS) ×1 IMPLANT

## 2022-02-19 NOTE — Anesthesia Preprocedure Evaluation (Signed)
Anesthesia Evaluation  Patient identified by MRN, date of birth, ID band Patient awake    Reviewed: Allergy & Precautions, NPO status , Patient's Chart, lab work & pertinent test results, reviewed documented beta blocker date and time   History of Anesthesia Complications Negative for: history of anesthetic complications  Airway Mallampati: II  TM Distance: >3 FB Neck ROM: Full    Dental no notable dental hx.    Pulmonary neg pulmonary ROS,    Pulmonary exam normal        Cardiovascular hypertension, Pt. on home beta blockers Normal cardiovascular exam     Neuro/Psych negative neurological ROS  negative psych ROS   GI/Hepatic negative GI ROS, Neg liver ROS,   Endo/Other  negative endocrine ROS  Renal/GU negative Renal ROS  negative genitourinary   Musculoskeletal negative musculoskeletal ROS (+)   Abdominal   Peds  Hematology  (+) Blood dyscrasia (Hgb 7.5), anemia ,   Anesthesia Other Findings Day of surgery medications reviewed with patient.  Reproductive/Obstetrics Recently delivered via NSVD at 14:25 today                             Anesthesia Physical Anesthesia Plan  ASA: 2  Anesthesia Plan: Epidural   Post-op Pain Management:    Induction:   PONV Risk Score and Plan: 2 and Treatment may vary due to age or medical condition, Ondansetron and Dexamethasone  Airway Management Planned: Natural Airway  Additional Equipment: None  Intra-op Plan:   Post-operative Plan:   Informed Consent: I have reviewed the patients History and Physical, chart, labs and discussed the procedure including the risks, benefits and alternatives for the proposed anesthesia with the patient or authorized representative who has indicated his/her understanding and acceptance.       Plan Discussed with: CRNA  Anesthesia Plan Comments:         Anesthesia Quick Evaluation

## 2022-02-19 NOTE — Op Note (Signed)
Operative Note   02/19/2022  PRE-OP DIAGNOSIS: Desire for permanent sterilization.  Postpartum Day #0   POST-OP DIAGNOSIS: status post bilateral salpingectomy  SURGEON: Dr. Janyth Pupa  ASSISTANT: Shelda Pal, DO An experienced assistant was required given the standard of surgical care given the complexity of the case.  This assistant was needed for exposure, dissection, suctioning, retraction, instrument exchange, and for overall help during the procedure.  ANESTHESIA: General and local  PROCEDURE: mini-laparotomy, bilateral tubal ligation via Modified [place] method  ESTIMATED BLOOD LOSS: 90mL  DRAINS: none  TOTAL IV FLUIDS: 368mL crystalloid  SPECIMENS:  Portions of bilateral tubes to pathology  VTE PROPHYLAXIS: SCDs to the bilateral lower extremities  ANTIBIOTICS: not indicated  COMPLICATIONS: none  DISPOSITION: PACU - hemodynamically stable.  CONDITION: stable  FINDINGS: No intra-abdominal adhesions noted. Smooth, normally contoured uterine fundus and bilateral tubes. Normal appearing tubes and normal ovaries on palpation.  PROCEDURE IN DETAIL: The patient was taken to the OR where anesthesia was administed. The patient was positioned in dorsal supine. The patient was prepped and draped in the normal sterile fashion a 4cm horizontal incision was made approximately 1 finger breadths below the umbilicus, after injection with local anesthesia. The skin was then incised with the scalpel and the underlying tissue dissected with the bovie and the fascia nicked in the midline with the scalpel and then extended laterally sharply.  The abdomen was then entered bluntly and a moist lap sponge used to displace the bowel.   The right fallopian tube was identified by tracing out to the fimbraie, grasped with the Babcock clamps and an avascular midsection of the tube approximately 3-4cm from the cornua was grasped with the Babcock clamps and brought into a knuckle. The  tube was ligated with one 0 plain gut free tie and then a 0-vicryl suture was placed below the tie and ligated. The intervening portion of tube was transected and removed via the Metzenbaum scissors.  Attention was then turned to the left fallopian tube, after confirmation of identification by tracing the tube out to the fimbriae. The same procedure was then performed on the right Fallopian tube.   The lap sponge was then removed from the abdomen and the fascia closed in running fashion with 0 vicryl and the subcutaneous tissue irrigated and closed with interrupted sutures of 0 plain gut. The skin was then closed with 4-0 monocryl.    The patient tolerated the procedure well. All counts were correct x 2. The patient was transferred to the recovery room awake, alert and breathing independently.   Shelda Pal, [place] Fellow, Faculty practice [place] [facility] [place] 02/19/22  5:42 PM

## 2022-02-19 NOTE — Progress Notes (Signed)
Patient ID: Becky Torres, female   DOB: 25-Mar-1991, 31 y.o.   MRN: [id number]  Labor Progress Note Becky Torres is a 31 y.o. G5P3013 at [redacted]w[redacted]d presented for SOL  S:  Pt resting comfortably with epidural, contractions have spaced out. Family at bedside for support.  O:  BP 124/80   Pulse 83   Temp 97.7 F (36.5 C) (Oral)   Resp 17   Ht 5' 3.5" (1.613 m)   Wt 207 lb (93.9 kg)   LMP 06/08/2021   SpO2 99%   BMI 36.09 kg/m  EFM: baseline 130 bpm/ moderate variability/ 15x15 accels/ no decels  Toco/IUPC: q7-54min SVE: Dilation: 6.5 Effacement (%): 80 Station: -2 Presentation: Vertex Exam by:: Evonnie Dawes, RN Pitocin: None  A/P: 31 y.o. P7T0626 [redacted]w[redacted]d  1. Labor: Active with stall at 6.5cm 2. FWB: Cat 1 3. Pain: Well-controlled with epidural in place  Discussed labor stall with Dr. Rip Harbour who agreed pt could be AROM'd now that she is in active labor. AROM completed with copious clear fluid. Cervix very soft and pt repositioned to high throne with knees together, ankles out to open lower pelvis. Anticipate SVD.  Gaylan Gerold, CNM, MSN, [place] Certified Nurse Midwife, [facility]

## 2022-02-19 NOTE — Lactation Note (Addendum)
This note was copied from a baby's chart. Lactation Consultation Note  Patient Name: Becky Torres IONGE'X Date: 02/19/2022 Reason for consult: Initial assessment;Late-preterm 34-36.6wks Age:31 hours Birth Parent feeding choice is breast and formula feeding infant. Birth Parent has breastfeeding experience see Maternal Data below. Per Birth Parent,  she has not latch infant at the breast yet but would like to latch infant at the next feeding. Infant was given 15 mls 1st feeding  and then 16 mls of Similac Neosure with iron 22 kcal. LC unable assist with latch at this time due infant recently being fed at 1815 pm. Birth Parent plans to latch infant at the next feeding knows to feeding infant according to LPTI feeding policy, BF infant 8x withon 24 hours, and afterwards supplement infant with any EBM 1st and then formula, intake amount on Day 1 is 14 mls per feeding, or more if infant wants it.  Birth Parent has LPTI feeding policy in basinet which [place] reviewed with Birth Parents. Birth Parent was using DEBP as LC left room and expressing colostrum in breast flanges, Birth Parent plans to offer to infant at next feeding after latching infant at the breast. Birth Parent knows EBM is safe for 4 hours at room temperature whereas formula is safe for 1 hour. Birth Parent will continue to use DEBP every 3 hours for 15 minutes on initial setting. Birth Parent knows to call [place] if their are any BF questions, concerns or if latch assistance is needed. [place] did [facility] referral for DEBP for [place]. Mom made aware of O/P services, breastfeeding support groups, community resources, and our phone # for post-discharge questions.   Maternal Data Has patient been taught Hand Expression?: Yes Does the patient have breastfeeding experience prior to this delivery?: Yes How long did the patient breastfeed?: Per Birth Parent she BF 1st child for 3 months, 2nd child for 6 months did not BF her 3rd  child  Feeding Mother's Current Feeding Choice: Breast Milk Nipple Type: Slow - flow  LATCH Score                    Lactation Tools Discussed/Used Tools: Pump Breast pump type: Double-Electric Breast Pump Pump Education: Setup, frequency, and cleaning;Milk Storage Reason for Pumping: LPTI, Birth Parent wanting to pump has not latch infant at the breast yet. Pumping frequency: Pump evry 3 hours for 15 minutes on inital setting.  Interventions Interventions: Breast feeding basics reviewed;Skin to skin;Position options;Hand express;Pace feeding;Education;LC Services brochure;LPT handout/interventions  Discharge WIC Program: Yes  Consult Status Consult Status: Follow-up Date: 02/20/22 Follow-up type: In-patient    Vicente Serene 02/19/2022, 7:13 PM

## 2022-02-19 NOTE — Anesthesia Preprocedure Evaluation (Addendum)
Anesthesia Evaluation  Patient identified by MRN, date of birth, ID band Patient awake    Reviewed: Allergy & Precautions, NPO status , Patient's Chart, lab work & pertinent test results, reviewed documented beta blocker date and time   Airway Mallampati: II  TM Distance: >3 FB Neck ROM: Full    Dental no notable dental hx.    Pulmonary neg pulmonary ROS  Pulmonary exam normal  breath sounds clear to auscultation       Cardiovascular hypertension, Pt. on medications and Pt. on home beta blockers Normal cardiovascular exam Rhythm:Regular Rate:Normal     Neuro/Psych negative neurological ROS  negative psych ROS   GI/Hepatic negative GI ROS, Neg liver ROS,,,  Endo/Other  negative endocrine ROS    Renal/GU negative Renal ROS  negative genitourinary   Musculoskeletal negative musculoskeletal ROS (+)    Abdominal   Peds  Hematology  (+) Blood dyscrasia (Hgb 7.4), Sickle cell trait and anemia ,   Anesthesia Other Findings Presents in labor  Reproductive/Obstetrics (+) Pregnancy                            Anesthesia Physical Anesthesia Plan  ASA: 3  Anesthesia Plan: Epidural   Post-op Pain Management:    Induction:   PONV Risk Score and Plan: Treatment may vary due to age or medical condition  Airway Management Planned: Natural Airway  Additional Equipment:   Intra-op Plan:   Post-operative Plan:   Informed Consent: I have reviewed the patients History and Physical, chart, labs and discussed the procedure including the risks, benefits and alternatives for the proposed anesthesia with the patient or authorized representative who has indicated his/her understanding and acceptance.       Plan Discussed with: Anesthesiologist  Anesthesia Plan Comments: (Patient identified. Risks, benefits, options discussed with patient including but not limited to bleeding, infection, nerve damage,  paralysis, failed block, incomplete pain control, headache, blood pressure changes, nausea, vomiting, reactions to medication, itching, and post partum back pain. Confirmed with bedside nurse the patient's most recent platelet count. Confirmed with the patient that they are not taking any anticoagulation, have any bleeding history or any family history of bleeding disorders. Patient expressed understanding and wishes to proceed. All questions were answered. )        Anesthesia Quick Evaluation

## 2022-02-19 NOTE — Progress Notes (Signed)
BPs 119-152/83-98.  Comfortable w/epidural.  Ctx q 2-5 minutes.  Cx now 6.5/80/-2.  FHR Cat 1.  Continue expectant mgt.

## 2022-02-19 NOTE — Progress Notes (Signed)
Patient ID: Becky Torres, female   DOB: Dec 15, 1990, 31 y.o.   MRN: [id number]  RN rechecked cervix, still 6.5 and contractions still spaced out. Ok to start Pitocin titration.  Gaylan Gerold, CNM, MSN, [place] Certified Nurse Midwife, [facility]

## 2022-02-19 NOTE — Anesthesia Procedure Notes (Signed)

## 2022-02-19 NOTE — Progress Notes (Signed)
Patient ID: Becky Torres, female   DOB: 01/27/1991, 31 y.o.   MRN: [id number]  Patient desires bilateral tubal sterilization.  Other reversible forms of contraception were discussed with patient; she declines all other modalities. Discussed bilateral tubal sterilization in detail; discussed options of  bilateral tubal sterilization using Filshie clips vs bilateral salpingectomy. Risks and benefits discussed in detail including but not limited to: risk of regret, permanence of method, bleeding, infection, injury to surrounding organs and need for additional procedures.  Failure risk of 1-2 % for Filshie clips and <1% for bilateral salpingectomy with increased risk of ectopic gestation if pregnancy occurs was also discussed with patient.  Also discussed possible reduction of risk of ovarian cancer via bilateral salpingectomy given that a growing body of knowledge reveals that the majority of cases of high grade serous "ovarian" cancer actually are actually  cancers arising from the fimbriated end of the fallopian tubes. Emphasized that removal of fallopian tubes do not result in any known hormonal imbalance.  Patient verbalized understanding of these risks and benefits and wants to proceed with sterilization with  bilateral sterilization via salpingectomy     Medicaid papers have been signed. Nursing and OR aware. To OR when ready

## 2022-02-19 NOTE — Anesthesia Postprocedure Evaluation (Signed)
Anesthesia Post Note  Patient: Becky Torres  Procedure(s) Performed: POST PARTUM TUBAL LIGATION     Patient location during evaluation: Mother Baby Anesthesia Type: Epidural Level of consciousness: oriented and awake and alert Pain management: pain level controlled Vital Signs Assessment: post-procedure vital signs reviewed and stable Respiratory status: spontaneous breathing and respiratory function stable Cardiovascular status: blood pressure returned to baseline and stable Postop Assessment: no headache, no backache, no apparent nausea or vomiting and able to ambulate Anesthetic complications: no   No notable events documented.  Last Vitals:  Vitals:   02/19/22 1716 02/19/22 1730  BP: 123/76 122/85  Pulse: (!) 107 94  Resp: (!) 25 16  Temp: 36.7 C   SpO2: 98% 100%    Last Pain:  Vitals:   02/19/22 1730  TempSrc:   PainSc: 0-No pain   Pain Goal:    LLE Motor Response: Non-purposeful movement (02/19/22 1730) LLE Sensation: Decreased (02/19/22 1730) RLE Motor Response: Non-purposeful movement (02/19/22 1730) RLE Sensation: Decreased (02/19/22 1730) L Sensory Level: T10-Umbilical region (22/29/79 1730) R Sensory Level: T10-Umbilical region (89/21/19 1730) Epidural/Spinal Function Cutaneous sensation: Able to Discern Pressure (02/19/22 1730), Patient able to flex knees: No (02/19/22 1730), Patient able to lift hips off bed: No (02/19/22 1730), Back pain beyond tenderness at insertion site: No (02/19/22 1730), Progressively worsening motor and/or sensory loss: No (02/19/22 1730), Bowel and/or bladder incontinence post epidural: No (02/19/22 1730)  [place]

## 2022-02-19 NOTE — Transfer of Care (Signed)
Immediate Anesthesia Transfer of Care Note  Patient: Becky Torres  Procedure(s) Performed: POST PARTUM TUBAL LIGATION  Patient Location: PACU  Anesthesia Type:Epidural  Level of Consciousness: awake, alert  and oriented  Airway & Oxygen Therapy: Patient Spontanous Breathing  Post-op Assessment: Report given to RN and Post -op Vital signs reviewed and stable  Post vital signs: Reviewed and stable  Last Vitals:  Vitals Value Taken Time  BP 123/76 02/19/22 1716  Temp 36.7 C 02/19/22 1716  Pulse 106 02/19/22 1717  Resp 23 02/19/22 1717  SpO2 99 % 02/19/22 1717  Vitals shown include unvalidated device data.  Last Pain:  Vitals:   02/19/22 1716  TempSrc: Oral  PainSc:          Complications: No notable events documented.

## 2022-02-20 ENCOUNTER — Encounter (HOSPITAL_COMMUNITY): Payer: Self-pay | Admitting: Obstetrics & Gynecology

## 2022-02-20 MED ORDER — LABETALOL HCL 200 MG PO TABS
200.0000 mg | ORAL_TABLET | Freq: Two times a day (BID) | ORAL | Status: DC
  Administered 2022-02-20 – 2022-02-21 (×3): 200 mg via ORAL
  Filled 2022-02-20 (×3): qty 1

## 2022-02-20 MED ORDER — FUROSEMIDE 20 MG PO TABS
20.0000 mg | ORAL_TABLET | Freq: Every day | ORAL | Status: DC
  Administered 2022-02-20 – 2022-02-21 (×2): 20 mg via ORAL
  Filled 2022-02-20 (×2): qty 1

## 2022-02-20 NOTE — Anesthesia Postprocedure Evaluation (Signed)
Anesthesia Post Note  Patient: Cristyn P Weng  Procedure(s) Performed: AN AD HOC LABOR EPIDURAL     Patient location during evaluation: Mother Baby Anesthesia Type: Epidural Level of consciousness: awake and alert Pain management: pain level controlled Vital Signs Assessment: post-procedure vital signs reviewed and stable Respiratory status: spontaneous breathing, nonlabored ventilation and respiratory function stable Cardiovascular status: stable Postop Assessment: no headache, no backache and epidural receding Anesthetic complications: no   No notable events documented.  Last Vitals:  Vitals:   02/19/22 2348 02/20/22 0324  BP: 130/74 121/79  Pulse: 69 69  Resp: 18 18  Temp: 36.8 C 36.8 C  SpO2: 98% 95%    Last Pain:  Vitals:   02/20/22 0523  TempSrc:   PainSc: 6    Pain Goal:                   Rayvon Char

## 2022-02-20 NOTE — Lactation Note (Signed)
This note was copied from a baby's chart. Lactation Consultation Note  Patient Name: Becky Torres Date: 02/20/2022 Reason for consult: Follow-up assessment;Late-preterm 34-36.6wks;Breastfeeding assistance Age:31 hours  LC entered the room and the infant was being held by a support person.  Per the birth parent, the infant latched well earlier, but she has been in pain.  She stated that she had a tubal yesterday and wanted to wait until she was no longer sore before she breast fed the infant.  The birth parent said that she has been pumping and she was able to express some colostrum.  The birth parent will call for assistance with breastfeeding when she is ready to work on breastfeeding.   Maternal Data    Feeding Mother's Current Feeding Choice: Breast Milk and Formula Nipple Type: Slow - flow  LATCH Score                    Lactation Tools Discussed/Used    Interventions Interventions: Breast feeding basics reviewed;Education  Discharge    Consult Status Consult Status: Follow-up Date: 02/21/22 Follow-up type: In-patient    Lysbeth Penner 02/20/2022, 6:15 PM

## 2022-02-20 NOTE — Progress Notes (Signed)
POSTPARTUM PROGRESS NOTE  PPD/POD#1  Subjective:  Becky Torres is a 31 y.o. K5G2563 s/p NSVD and BTL at [redacted]w[redacted]d. Today she notes no acute complaints. She denies any problems with ambulating, voiding or po intake. Denies nausea or vomiting. She has passed flatus, noBM.  Pain is well controlled.  Lochia minimal Denies fever/chills/chest pain/SOB.   Objective: Blood pressure 121/79, pulse 69, temperature 98.2 F (36.8 C), temperature source Oral, resp. rate 18, height 5' 3.5" (1.613 m), weight 93.9 kg, last menstrual period 06/08/2021, SpO2 95 %, unknown if currently breastfeeding.  Physical Exam:  General: alert, cooperative and no distress Chest: no respiratory distress Heart: regular rate and rhythm Abdomen: obese soft, non-tender Uterine Fundus: firm, appropriately tender Incision: C/D/I with honeycomb DVT Evaluation: No calf swelling or tenderness Extremities: minimal edema, SCDs in place Skin: warm, dry  No results found for this or any previous visit (from the past 24 hour(s)).  Assessment/Plan: Becky Torres is a 31 y.o. S9H7342 s/p NSVD and PP bilateral salpingectomy at [redacted]w[redacted]d PPD/POD #1  -pain well controlled -meeting milestones appropriately -baby boy circ completed -chronic HTN- desires to continue with Labetalol- Labetalol and Lasix ordered  Contraception: tubal ligation Feeding: breast  Dispo: management as outlined above, plan for discharge home tmr   LOS: 1 day   Janyth Pupa, DO Faculty Attending, [facility] [place] 02/20/2022, 11:50 AM

## 2022-02-20 NOTE — Progress Notes (Signed)
Circumcision Consent  Discussed with mom at bedside about circumcision.   Circumcision is a surgery that removes the skin that covers the tip of the penis, called the "foreskin." Circumcision is usually done when a boy is between 1 and 10 days old, sometimes up to 3-4 weeks old.  The most common reasons boys are circumcised include for cultural/religious beliefs or for parental preference (potentially easier to clean, so baby looks like daddy, etc).  There may be some medical benefits for circumcision:   Circumcised boys seem to have slightly lower rates of: ? Urinary tract infections (per the American Academy of Pediatrics an uncircumcised boy has a 1/100 chance of developing a UTI in the first year of life, a circumcised boy at a 05/998 chance of developing a UTI in the first year of life- a 10% reduction) ? Penis cancer (typically rare- an uncircumcised female has a 1 in 100,000 chance of developing cancer of the penis) ? Sexually transmitted infection (in endemic areas, including HIV, HPV and Herpes- circumcision does NOT protect against gonorrhea, chlamydia, trachomatis, or syphilis) ? Phimosis: a condition where that makes retraction of the foreskin over the glans impossible (0.4 per 1000 boys per year or 0.6% of boys are affected by their 15th birthday)  Boys and men who are not circumcised can reduce these extra risks by: ? Cleaning their penis well ? Using condoms during sex  What are the risks of circumcision?  As with any surgical procedure, there are risks and complications. In circumcision, complications are rare and usually minor, the most common being: ? Bleeding- risk is reduced by holding each clamp for 30 seconds prior to a cut being made, and by holding pressure after the procedure is done ? Infection- the penis is cleaned prior to the procedure, and the procedure is done under sterile technique ? Damage to the urethra or amputation of the penis  How is circumcision done  in baby boys?  The baby will be placed on a special table and the legs restrained for their safety. Numbing medication is injected into the penis, and the skin is cleansed with betadine to decrease the risk of infection.   What to expect:  The penis will look red and raw for 5-7 days as it heals. We expect scabbing around where the cut was made, as well as clear-pink fluid and some swelling of the penis right after the procedure. If your baby's circumcision starts to bleed or develops pus, please contact your pediatrician immediately.  All questions were answered and mother consented.  Appollonia Klee, DO Attending Obstetrician & Gynecologist, [facility], [facility]   

## 2022-02-21 ENCOUNTER — Ambulatory Visit: Payer: Self-pay

## 2022-02-21 ENCOUNTER — Encounter: Payer: Self-pay | Admitting: Obstetrics and Gynecology

## 2022-02-21 LAB — CULTURE, BETA STREP (GROUP B ONLY)

## 2022-02-21 MED ORDER — FUROSEMIDE 20 MG PO TABS: 20.0000 mg | ORAL_TABLET | Freq: Every day | ORAL | 0 refills | Status: AC

## 2022-02-21 MED ORDER — IBUPROFEN 600 MG PO TABS: 600.0000 mg | ORAL_TABLET | Freq: Four times a day (QID) | ORAL | 0 refills | Status: AC

## 2022-02-21 NOTE — Progress Notes (Signed)
Babyscripts order initiated per MD order. Patient states she already has received a blood pressure cuff during pregnancy to monitor blood pressures. Patient entered morning blood pressure into epic. Confirmed that blood pressure does transfer over into episodes of care in OB home device data. Becky Torres, [name] Sarah Ann

## 2022-02-21 NOTE — Lactation Note (Signed)
This note was copied from a baby's chart. Lactation Consultation Note  Patient Name: Becky Torres [id number] Date: 02/21/2022 Reason for consult: Follow-up assessment Age:31 hours Asked mom how was she doing and did she need anything from Lactation. Mom stated no she was doing well , the baby is BF well 15 minutes then she is giving formula. Mom stated she is experienced BF and has no further questions or concerns. Congratulated mom on her new baby.  Maternal Data    Feeding Nipple Type: Slow - flow  LATCH Score                    Lactation Tools Discussed/Used    Interventions    Discharge    Consult Status Consult Status: Complete (mother declined follow up)    Shauntelle Jamerson G 02/21/2022, 8:55 PM

## 2022-02-21 NOTE — Discharge Summary (Addendum)
Postpartum Discharge Summary     Patient Name: Becky Torres DOB: Jul 06, 1990 MRN: [id number]  Date of admission: 02/18/2022 Delivery date:02/19/2022  Delivering provider: Owens Loffler  Date of discharge: 02/21/2022  Admitting diagnosis: Indication for care in labor or delivery [O75.9] Intrauterine pregnancy: [redacted]w[redacted]d    Secondary diagnosis:  Principal Problem:   Indication for care in labor or delivery  Additional problems: elevated BP    Discharge diagnosis: Preterm Pregnancy Delivered                                              Post partum procedures:postpartum tubal ligation Augmentation: AROM and Pitocin Complications: None  Hospital course: Spontaneous onset of Labor With Vaginal Delivery   31y.o. yo [id number] admitted to the hospital 02/18/2022 for preterm labor.  Patient had an labor course complicated by none and slowly made change from 3-5.5 cm, AROM and pitocin. Membrane Rupture Time/Date: 9:04 AM ,02/19/2022   Delivery Method:Vaginal, Spontaneous  Episiotomy: None  Lacerations:  None  Details of delivery can be found in separate delivery note.  Patient had a postpartum course complicated by normal BP, on lasix. Patient is discharged home 02/21/22.  Newborn Data: Birth date:02/19/2022  Birth time:2:25 PM  Gender:Female  Living status:Living  Apgars:9 ,9  Weight:2860 g   Magnesium Sulfate received: No BMZ received: No Rhophylac:N/A MMR:N/A T-DaP:Given prenatally Flu: No Transfusion:No  Physical exam  Vitals:   02/20/22 1218 02/20/22 1607 02/20/22 2112 02/21/22 0514  BP: 129/82 103/71 (!) 121/55 115/68  Pulse: 90  (!) 103 76  Resp:  17 18 18   Temp:  98.1 F (36.7 C) 98.5 F (36.9 C) 97.9 F (36.6 C)  TempSrc:  Oral Oral Oral  SpO2:   98%   Weight:      Height:       General: alert, cooperative, and no distress Lochia: appropriate Uterine Fundus: firm Incision: Healing well with no significant drainage DVT Evaluation: No  evidence of DVT seen on physical exam. Labs: Lab Results  Component Value Date   WBC 10.6 (H) 02/19/2022   HGB 7.5 (L) 02/19/2022   HCT 24.2 (L) 02/19/2022   MCV 64.5 (L) 02/19/2022   PLT 181 02/19/2022      Latest Ref Rng & Units 02/19/2022   12:27 AM  CMP  Glucose 70 - 99 mg/dL 93   BUN 6 - 20 mg/dL 5   Creatinine 0.44 - 1.00 mg/dL 0.95   Sodium 135 - 145 mmol/L 135   Potassium 3.5 - 5.1 mmol/L 3.4   Chloride 98 - 111 mmol/L 104   CO2 22 - 32 mmol/L 20   Calcium 8.9 - 10.3 mg/dL 9.1   Total Protein 6.5 - 8.1 g/dL 7.1   Total Bilirubin 0.3 - 1.2 mg/dL 0.5   Alkaline Phos 38 - 126 U/L 165   AST 15 - 41 U/L 20   ALT 0 - 44 U/L 13    Edinburgh Score:    02/20/2022   12:19 PM  Edinburgh Postnatal Depression Scale Screening Tool  I have been able to laugh and see the funny side of things. 0  I have looked forward with enjoyment to things. 0  I have blamed myself unnecessarily when things went wrong. 0  I have been anxious or worried for no good reason. 0  I  have felt scared or panicky for no good reason. 0  Things have been getting on top of me. 1  I have been so unhappy that I have had difficulty sleeping. 0  I have felt sad or miserable. 0  I have been so unhappy that I have been crying. 0  The thought of harming myself has occurred to me. 0  Edinburgh Postnatal Depression Scale Total 1     After visit meds:  Allergies as of 02/21/2022   No Known Allergies      Medication List     TAKE these medications    aspirin EC 81 MG tablet Take 1 tablet (81 mg total) by mouth daily. Swallow whole.   ferrous sulfate 325 (65 FE) MG EC tablet Take 1 tablet (325 mg total) by mouth every other day.   furosemide 20 MG tablet Commonly known as: LASIX Take 1 tablet (20 mg total) by mouth daily for 5 days.   ibuprofen 600 MG tablet Commonly known as: ADVIL Take 1 tablet (600 mg total) by mouth every 6 (six) hours.   labetalol 200 MG tablet Commonly known as:  NORMODYNE Take 1 tablet (200 mg total) by mouth every 12 (twelve) hours.   One-A-Day Womens Prenatal 1 28-0.8-235 MG Caps Take 1 capsule by mouth daily.         Discharge home in stable condition Infant Feeding: Breast Infant Disposition:rooming in Discharge instruction: per After Visit Summary and Postpartum booklet. Activity: Advance as tolerated. Pelvic rest for 6 weeks.  Diet: routine diet Future Appointments: Future Appointments  Date Time Provider [place]  02/23/2022 11:15 AM [facility] NURSE [facility]  02/23/2022 11:30 AM [facility] US2 [facility]  02/26/2022  9:35 AM Radene Gunning, MD [facility]  03/01/2022  3:15 PM [facility] NURSE [facility]  03/01/2022  3:30 PM [facility] US2 [facility]  03/05/2022  9:15 AM Griffin Basil, MD [facility]  03/05/2022  1:45 PM [facility] NURSE [facility]  03/05/2022  2:00 PM [facility]MFC US1 [facility]  03/11/2022 10:35 AM Johnston Ebbs, NP [facility]  03/19/2022 10:55 AM Donnamae Jude, MD [facility]  03/22/2022 11:00 AM THN CCC-MM SOCIAL WORKER 2 THN-CCC None  03/24/2022 10:35 AM Anyanwu, Sallyanne Havers, MD [facility]  03/24/2022 11:15 AM WMC-WOCA NST W[facility]-CWH [place]   Follow up Visit:  [place] [facility] Follow up.   Specialty: Obstetrics and Gynecology Contact information: [place] [phone]                 Please schedule this patient for a Virtual postpartum visit in 1 week with the following provider: RN. Additional Postpartum F/U:Incision check 1 week and BP check 1 week  High risk pregnancy complicated by: HTN Delivery mode:  Vaginal, Spontaneous  Anticipated Birth Control:  BTL done [facility]   02/21/2022 Donnamae Jude, MD

## 2022-02-21 NOTE — Lactation Note (Signed)
This note was copied from a baby's chart. Lactation Consultation Note  Patient Name: Boy Scotti Motter JHHID'U Date: 02/21/2022   Age:31 hours  LC attempted to visit with the birth parent.  The birth parent was asleep.  The supporting parent will let the birth parent know that [facility] came by.   Maternal Data    Feeding    LATCH Score                    Lactation Tools Discussed/Used    Interventions    Discharge    Consult Status      Lysbeth Penner 02/21/2022, 3:40 PM

## 2022-02-22 ENCOUNTER — Telehealth: Payer: Self-pay | Admitting: Family Medicine

## 2022-02-22 NOTE — Telephone Encounter (Signed)
Patient called in saying she had just delivered and already got her FMLA paperwork filled out. She works from home and is wanting to know if she can go back to work on Monday until her postpartum visit at 4 hours each day.

## 2022-02-23 ENCOUNTER — Ambulatory Visit: Payer: Medicaid Other

## 2022-02-23 LAB — SURGICAL PATHOLOGY

## 2022-02-24 ENCOUNTER — Other Ambulatory Visit: Payer: Self-pay | Admitting: Advanced Practice Midwife

## 2022-02-24 LAB — HSV DNA BY PCR (REFERENCE LAB)
HSV 1 DNA: NEGATIVE
HSV 2 DNA: NEGATIVE

## 2022-02-26 ENCOUNTER — Encounter: Payer: Medicaid Other | Admitting: Obstetrics and Gynecology

## 2022-03-01 ENCOUNTER — Telehealth: Payer: Self-pay | Admitting: *Deleted

## 2022-03-01 ENCOUNTER — Ambulatory Visit: Payer: Medicaid Other

## 2022-03-01 ENCOUNTER — Encounter: Payer: Self-pay | Admitting: *Deleted

## 2022-03-01 NOTE — Telephone Encounter (Signed)
Becky Torres [facility] Blood pressure appointment s/p preterm delivery with HTN noted. I called her and heard a message " your call cannot be completed, please try your call again later". I will send her a myChart message to notify her she missed appointment and to reschedule. Staci Acosta

## 2022-03-03 ENCOUNTER — Telehealth (HOSPITAL_COMMUNITY): Payer: Self-pay | Admitting: *Deleted

## 2022-03-03 DIAGNOSIS — Z419 Encounter for procedure for purposes other than remedying health state, unspecified: Secondary | ICD-10-CM | POA: Diagnosis not present

## 2022-03-03 NOTE — Telephone Encounter (Signed)
Patient reported that her iron at her [facility] appointment today was 10.9. Asked if she could continue to take her prenatal vitamins. RN encouraged her to do so. Patient voiced no other questions or concerns regarding her health at this time. EPDS=0. Patient voiced no questions or concerns regarding infant at this time. Patient reports infant sleeps in a bassinet on his back. RN reviewed ABCs of safe sleep. Patient verbalized understanding. Patient requested RN email information on hospital's virtual postpartum classes and support groups. Email sent. Erline Levine, RN, 03/03/22, [phone]

## 2022-03-05 ENCOUNTER — Ambulatory Visit: Payer: Medicaid Other

## 2022-03-05 ENCOUNTER — Encounter: Payer: Medicaid Other | Admitting: Obstetrics and Gynecology

## 2022-03-11 ENCOUNTER — Encounter: Payer: Self-pay | Admitting: Student

## 2022-03-16 NOTE — BH Specialist Note (Signed)
Pt did not arrive to video visit and did not answer the phone; Unable to leave voice message; left MyChart message for patient.   

## 2022-03-19 ENCOUNTER — Encounter: Payer: Self-pay | Admitting: Family Medicine

## 2022-03-22 ENCOUNTER — Other Ambulatory Visit: Payer: Medicaid Other

## 2022-03-22 NOTE — Patient Instructions (Signed)
Visit Information  Becky Torres was given information about Medicaid Managed Care team care coordination services as a part of their [facility] Medicaid benefit. Becky Torres verbally consented to engagement with the [facility] Managed Care team.   If you are experiencing a medical emergency, please call 911 or report to your local emergency department or urgent care.   If you have a non-emergency medical problem during routine business hours, please contact your provider's office and ask to speak with a nurse.   For questions related to your [facility] health plan, please call: [phone] or go here:[email or web address]  If you would like to schedule transportation through your [facility] plan, please call the following number at least 2 days in advance of your appointment: [phone].  You can also use the MTM portal or MTM mobile app to manage your rides. For the portal, please go to [email or web address]  Call the [facility] Crisis Line at [phone], at any time, 24 hours a day, 7 days a week. If you are in danger or need immediate medical attention call 911.  If you would like help to quit smoking, call 1-800-QUIT-NOW ((223)369-7742) OR Espaol: 1-855-Djelo-Ya (1-448-185-6314) o para ms informacin haga clic aqu or Text READY to 970-263 to register via text  Becky Torres - following are the goals we discussed in your visit today:   Goals Addressed   None       The  Patient                                              has been provided with contact information for the Managed Medicaid care management team and has been advised to call with any health related questions or concerns.   Gus Puma, BSW, [facility]  [place]  High Risk Managed Medicaid Team  [phone]   Following is a copy of your plan of care:  There are no care plans that you recently modified to display for this patient.

## 2022-03-22 NOTE — Patient Outreach (Signed)
  Medicaid Managed Care Social Work Note  03/22/2022 Name:  Becky Torres MRN:  [id number] DOB:  1991/02/01  Becky Torres is an 31 y.o. year old female who is a primary patient of Pcp, No.  The Medicaid Managed Care Coordination team was consulted for assistance with:  [facility]   Becky Torres was given information about Medicaid Managed Care Coordination team services today. Becky Numbers Foss Patient agreed to services and verbal consent obtained.  Engaged with patient  for by telephone forfollow up visit in response to referral for case management and/or care coordination services.   Assessments/Interventions:  Review of past medical history, allergies, medications, health status, including review of consultants reports, laboratory and other test data, was performed as part of comprehensive evaluation and provision of chronic care management services.  SDOH: (Social Determinant of Health) assessments and interventions performed: BSW completed a telephone outreach with patient. Patient states baby is doing well and no resources are needed at this time.   Advanced Directives Status:  Not addressed in this encounter.  Care Plan                 No Known Allergies  Medications Reviewed Today     Reviewed by [place], RN (Registered Nurse) on 02/18/22 at 1802  Med List Status: <None>   Medication Order Taking? Sig Documenting Provider Last Dose Status Informant  aspirin EC 81 MG tablet [id number] Yes Take 1 tablet (81 mg total) by mouth daily. Swallow whole. Venora Maples, MD 02/17/2022 Active   ferrous sulfate 325 (65 FE) MG EC tablet [id number]  Take 1 tablet (325 mg total) by mouth every other day. Venora Maples, MD  Active            Med Note Ardelia Mems, MELANIE A   Wed Jan 27, 2022  3:22 PM) Patient unaware of this prescription, will pick up this week.  labetalol (NORMODYNE) 200 MG tablet [id number] Yes Take 1 tablet (200 mg total) by mouth every 12 (twelve)  hours. Aviva Signs, CNM 02/17/2022 Active   Prenat-Fe Carbonyl-FA-Omega 3 (ONE-A-DAY WOMENS PRENATAL 1) 28-0.8-235 MG CAPS [id number] Yes Take 1 capsule by mouth daily. [provider] 02/18/2022 Active             Patient Active Problem List   Diagnosis Date Noted   Indication for care in labor or delivery 02/19/2022   Anemia of pregnancy in third trimester 01/18/2022   Cholelithiasis affecting pregnancy in first trimester, antepartum 08/30/2021   Patient has son with Sickle Cell Disease 08/29/2021   UTI in pregnancy 08/29/2021   Sickle cell trait (HCC)    Supervision of high risk pregnancy, antepartum 08/11/2021   Chronic hypertension complicating or reason for care during pregnancy, third trimester 07/24/2012    Conditions to be addressed/monitored per PCP order:   community resources  There are no care plans that you recently modified to display for this patient.   Follow up:  Patient agrees to Care Plan and Follow-up.  Plan: The  Patient has been provided with contact information for the Managed Medicaid care management team and has been advised to call with any health related questions or concerns.    Gus Puma, BSW, [facility]  [place]  High Risk Managed Medicaid Team  [phone]

## 2022-03-23 ENCOUNTER — Ambulatory Visit: Payer: Medicaid Other | Admitting: Clinical

## 2022-03-23 DIAGNOSIS — Z91199 Patient's noncompliance with other medical treatment and regimen due to unspecified reason: Secondary | ICD-10-CM

## 2022-03-24 ENCOUNTER — Encounter: Payer: Self-pay | Admitting: Obstetrics & Gynecology

## 2022-03-24 ENCOUNTER — Other Ambulatory Visit: Payer: Self-pay

## 2022-03-30 NOTE — Progress Notes (Unsigned)
Provider location: [facility] [employer] [facility] [facility]   Patient location: Home  I connected with Becky Torres on 03/31/22 at 10:35 AM EST by Mychart Video Encounter and verified that I am speaking with the correct person using two identifiers.       I discussed the limitations, risks, security and privacy concerns of performing an evaluation and management service virtually and the availability of in person appointments. I also discussed with the patient that there may be a patient responsible charge related to this service. The patient expressed understanding and agreed to proceed.  Post Partum Visit Note Subjective:   Becky Torres is a 31 y.o. [id number] female who presents for a postpartum visit. She is 5 weeks 4 days postpartum following a normal spontaneous vaginal delivery.  I have fully reviewed the prenatal and intrapartum course. The delivery was at *** gestational weeks.  Anesthesia: {anesthesia types:812}. Postpartum course has been ***. Baby is doing well***. Baby is feeding by {breast/bottle:69}. Bleeding {vag bleed:12292}. Bowel function is {normal:32111}. Bladder function is {normal:32111}. Patient {is/is not:9024} sexually active. Contraception method is {contraceptive method:5051}. Postpartum depression screening: {gen negative/positive:315881}.   The pregnancy intention screening data noted above was reviewed. Potential methods of contraception were discussed. The patient elected to proceed with No data recorded.    {Common ambulatory SmartLinks:19316}  Review of Systems {ros; complete:30496}  Objective:  LMP 06/08/2021     General:  Alert, oriented and cooperative. Patient is in no acute distress.  Respiratory: Normal respiratory effort, no problems with respiration noted  Mental Status: Normal mood and affect. Normal behavior. Normal judgment and thought content.  Rest of physical exam deferred due to type of encounter   Assessment:    ***  postpartum exam.  Plan:  Essential components of care per ACOG recommendations:  1.  Mood and well being: Patient with {gen negative/positive:315881} depression screening today. Reviewed local resources for support.  - Patient {Action; does/does not:19097} use tobacco. ***If using tobacco we discussed reduction and for recently cessation risk of relapse - hx of drug use? {yes/no:20286}  *** If yes, discussed support systems  2. Infant care and feeding:  -Patient currently breastmilk feeding? {yes/no:20286} ***If breastmilk feeding discussed return to work and pumping. If needed, patient was provided letter for work to allow for every 2-3 hr pumping breaks, and to be granted a private location to express breastmilk and refrigerated area to store breastmilk. Reviewed importance of draining breast regularly to support lactation. -Social determinants of health (SDOH) reviewed in EPIC. No concerns***The following needs were identified***  3. Sexuality, contraception and birth spacing - Patient {DOES_DOES YWV:37106} want a pregnancy in the next year.  Desired family size is {NUMBER 1-10:22536} children.  - Reviewed forms of contraception in tiered fashion. Patient desired {PLAN CONTRACEPTION:313102} today.   - Discussed birth spacing of 18 months  4. Sleep and fatigue -Encouraged family/partner/community support of 4 hrs of uninterrupted sleep to help with mood and fatigue  5. Physical Recovery  - Discussed patients delivery*** and complications - Patient had a *** degree laceration, perineal healing reviewed. Patient expressed understanding - Patient has urinary incontinence? {yes/no:20286}*** Patient was referred to pelvic floor PT  - Patient {ACTION; IS/IS YIR:48546270} safe to resume physical and sexual activity  6.  Health Maintenance - Last pap smear done *** and was {Normal/abnormal wildcard:19619} with negative HPV. ***Mammogram  7. ***Chronic Disease - PCP follow up  I provided  *** minutes of face-to-face time during this encounter.  No follow-ups on file.  Future Appointments  Date Time Provider Department Center  03/31/2022 10:35 AM Shantell Belongia, Jethro Bastos, MD [facility]    Marjo Bicker, RN [facility] for [employer], [facility]

## 2022-03-31 ENCOUNTER — Encounter: Payer: Self-pay | Admitting: Obstetrics & Gynecology

## 2022-03-31 ENCOUNTER — Telehealth (INDEPENDENT_AMBULATORY_CARE_PROVIDER_SITE_OTHER): Payer: Medicaid Other | Admitting: Obstetrics & Gynecology

## 2022-04-02 DIAGNOSIS — Z419 Encounter for procedure for purposes other than remedying health state, unspecified: Secondary | ICD-10-CM | POA: Diagnosis not present

## 2022-05-03 DIAGNOSIS — Z419 Encounter for procedure for purposes other than remedying health state, unspecified: Secondary | ICD-10-CM | POA: Diagnosis not present

## 2022-06-03 DIAGNOSIS — Z419 Encounter for procedure for purposes other than remedying health state, unspecified: Secondary | ICD-10-CM | POA: Diagnosis not present

## 2022-07-02 DIAGNOSIS — Z419 Encounter for procedure for purposes other than remedying health state, unspecified: Secondary | ICD-10-CM | POA: Diagnosis not present

## 2022-08-02 DIAGNOSIS — Z419 Encounter for procedure for purposes other than remedying health state, unspecified: Secondary | ICD-10-CM | POA: Diagnosis not present

## 2022-08-26 ENCOUNTER — Telehealth: Payer: Self-pay

## 2022-08-26 ENCOUNTER — Other Ambulatory Visit: Payer: Self-pay

## 2022-08-26 NOTE — Telephone Encounter (Signed)
LVM for patient to call back to schedule an apt with PCP. AS, CMA

## 2022-09-01 DIAGNOSIS — Z419 Encounter for procedure for purposes other than remedying health state, unspecified: Secondary | ICD-10-CM | POA: Diagnosis not present

## 2022-09-30 ENCOUNTER — Ambulatory Visit: Payer: Medicaid Other | Admitting: Family Medicine

## 2022-10-02 DIAGNOSIS — Z419 Encounter for procedure for purposes other than remedying health state, unspecified: Secondary | ICD-10-CM | POA: Diagnosis not present

## 2022-11-01 DIAGNOSIS — Z419 Encounter for procedure for purposes other than remedying health state, unspecified: Secondary | ICD-10-CM | POA: Diagnosis not present

## 2022-12-02 DIAGNOSIS — Z419 Encounter for procedure for purposes other than remedying health state, unspecified: Secondary | ICD-10-CM | POA: Diagnosis not present

## 2022-12-20 ENCOUNTER — Telehealth: Payer: Self-pay | Admitting: *Deleted

## 2022-12-20 NOTE — Patient Outreach (Signed)
Becky Torres was referred to the [facility] Managed Care High Risk team for assistance with care coordination and care management services. Care coordination/care management services as part of the Medicaid benefit was offered to the patient today. The patient declined assistance offered today.   Plan: The Medicaid Managed Care High Risk team is available at any time in the future to assist with care coordination/care management services upon referral.   Estanislado Emms RN, BSN [place]  Managed [facility] RN Care Coordinator [phone]

## 2023-01-02 DIAGNOSIS — Z419 Encounter for procedure for purposes other than remedying health state, unspecified: Secondary | ICD-10-CM | POA: Diagnosis not present
